# Patient Record
Sex: Male | Born: 1955 | Race: White | Hispanic: No | Smoking: Light tobacco smoker
Health system: Southern US, Community
[De-identification: ages and names within clinical notes are randomized; demographics above are authoritative.]

## PROBLEM LIST (undated history)

## (undated) DIAGNOSIS — I4892 Unspecified atrial flutter: Secondary | ICD-10-CM

## (undated) DIAGNOSIS — R590 Localized enlarged lymph nodes: Secondary | ICD-10-CM

## (undated) DIAGNOSIS — R739 Hyperglycemia, unspecified: Secondary | ICD-10-CM

## (undated) DIAGNOSIS — B269 Mumps without complication: Secondary | ICD-10-CM

## (undated) DIAGNOSIS — B059 Measles without complication: Secondary | ICD-10-CM

## (undated) DIAGNOSIS — J302 Other seasonal allergic rhinitis: Secondary | ICD-10-CM

## (undated) DIAGNOSIS — I10 Essential (primary) hypertension: Secondary | ICD-10-CM

## (undated) DIAGNOSIS — H40059 Ocular hypertension, unspecified eye: Secondary | ICD-10-CM

## (undated) DIAGNOSIS — Z9109 Other allergy status, other than to drugs and biological substances: Secondary | ICD-10-CM

## (undated) DIAGNOSIS — Z8619 Personal history of other infectious and parasitic diseases: Secondary | ICD-10-CM

## (undated) HISTORY — PX: OTHER SURGICAL HISTORY: SHX169

## (undated) HISTORY — DX: Other allergy status, other than to drugs and biological substances: Z91.09

## (undated) HISTORY — PX: COLONOSCOPY: SHX174

## (undated) HISTORY — DX: Measles without complication: B05.9

## (undated) HISTORY — DX: Hyperglycemia, unspecified: R73.9

## (undated) HISTORY — PX: CHOLECYSTECTOMY: SHX55

## (undated) HISTORY — PX: WISDOM TOOTH EXTRACTION: SHX21

## (undated) HISTORY — DX: Mumps without complication: B26.9

## (undated) HISTORY — DX: Localized enlarged lymph nodes: R59.0

## (undated) HISTORY — DX: Personal history of other infectious and parasitic diseases: Z86.19

## (undated) HISTORY — DX: Other seasonal allergic rhinitis: J30.2

## (undated) HISTORY — PX: TONSILLECTOMY: SUR1361

## (undated) HISTORY — DX: Unspecified atrial flutter: I48.92

## (undated) HISTORY — DX: Ocular hypertension, unspecified eye: H40.059

---

## 2001-04-08 ENCOUNTER — Encounter: Admission: RE | Admit: 2001-04-08 | Discharge: 2001-07-07 | Payer: Self-pay | Admitting: Family Medicine

## 2001-04-17 ENCOUNTER — Ambulatory Visit (HOSPITAL_BASED_OUTPATIENT_CLINIC_OR_DEPARTMENT_OTHER): Admission: RE | Admit: 2001-04-17 | Discharge: 2001-04-17 | Payer: Self-pay | Admitting: Otolaryngology

## 2001-12-17 ENCOUNTER — Encounter: Payer: Self-pay | Admitting: Neurosurgery

## 2001-12-17 ENCOUNTER — Observation Stay (HOSPITAL_COMMUNITY): Admission: RE | Admit: 2001-12-17 | Discharge: 2001-12-18 | Payer: Self-pay | Admitting: Neurosurgery

## 2008-11-22 LAB — HM COLONOSCOPY

## 2011-04-30 LAB — HM COLONOSCOPY

## 2014-05-18 ENCOUNTER — Ambulatory Visit (INDEPENDENT_AMBULATORY_CARE_PROVIDER_SITE_OTHER): Payer: Managed Care, Other (non HMO) | Admitting: Physician Assistant

## 2014-05-18 ENCOUNTER — Encounter: Payer: Self-pay | Admitting: Physician Assistant

## 2014-05-18 VITALS — BP 153/57 | HR 84 | Temp 98.0°F | Resp 16 | Ht 68.0 in | Wt 240.5 lb

## 2014-05-18 DIAGNOSIS — Z0000 Encounter for general adult medical examination without abnormal findings: Secondary | ICD-10-CM

## 2014-05-18 DIAGNOSIS — Z720 Tobacco use: Secondary | ICD-10-CM

## 2014-05-18 DIAGNOSIS — R030 Elevated blood-pressure reading, without diagnosis of hypertension: Secondary | ICD-10-CM

## 2014-05-18 DIAGNOSIS — IMO0001 Reserved for inherently not codable concepts without codable children: Secondary | ICD-10-CM

## 2014-05-18 NOTE — Patient Instructions (Signed)
Please read information below on smoking cessation. Please let me know if you do decide to proceed with medication to aid in this.  Please schedule a lab appointment for your physical labs.  You need to fast for 8 hours prior to this.  I will call you with all of your results.  Smoking Cessation, Tips for Success If you are ready to quit smoking, congratulations! You have chosen to help yourself be healthier. Cigarettes bring nicotine, tar, carbon monoxide, and other irritants into your body. Your lungs, heart, and blood vessels will be able to work better without these poisons. There are many different ways to quit smoking. Nicotine gum, nicotine patches, a nicotine inhaler, or nicotine nasal spray can help with physical craving. Hypnosis, support groups, and medicines help break the habit of smoking. WHAT THINGS CAN I DO TO MAKE QUITTING EASIER?  Here are some tips to help you quit for good:  Pick a date when you will quit smoking completely. Tell all of your friends and family about your plan to quit on that date.  Do not try to slowly cut down on the number of cigarettes you are smoking. Pick a quit date and quit smoking completely starting on that day.  Throw away all cigarettes.   Clean and remove all ashtrays from your home, work, and car.  On a card, write down your reasons for quitting. Carry the card with you and read it when you get the urge to smoke.  Cleanse your body of nicotine. Drink enough water and fluids to keep your urine clear or pale yellow. Do this after quitting to flush the nicotine from your body.  Learn to predict your moods. Do not let a bad situation be your excuse to have a cigarette. Some situations in your life might tempt you into wanting a cigarette.  Never have "just one" cigarette. It leads to wanting another and another. Remind yourself of your decision to quit.  Change habits associated with smoking. If you smoked while driving or when feeling stressed,  try other activities to replace smoking. Stand up when drinking your coffee. Brush your teeth after eating. Sit in a different chair when you read the paper. Avoid alcohol while trying to quit, and try to drink fewer caffeinated beverages. Alcohol and caffeine may urge you to smoke.  Avoid foods and drinks that can trigger a desire to smoke, such as sugary or spicy foods and alcohol.  Ask people who smoke not to smoke around you.  Have something planned to do right after eating or having a cup of coffee. For example, plan to take a walk or exercise.  Try a relaxation exercise to calm you down and decrease your stress. Remember, you may be tense and nervous for the first 2 weeks after you quit, but this will pass.  Find new activities to keep your hands busy. Play with a pen, coin, or rubber band. Doodle or draw things on paper.  Brush your teeth right after eating. This will help cut down on the craving for the taste of tobacco after meals. You can also try mouthwash.   Use oral substitutes in place of cigarettes. Try using lemon drops, carrots, cinnamon sticks, or chewing gum. Keep them handy so they are available when you have the urge to smoke.  When you have the urge to smoke, try deep breathing.  Designate your home as a nonsmoking area.  If you are a heavy smoker, ask your health care provider about  a prescription for nicotine chewing gum. It can ease your withdrawal from nicotine.  Reward yourself. Set aside the cigarette money you save and buy yourself something nice.  Look for support from others. Join a support group or smoking cessation program. Ask someone at home or at work to help you with your plan to quit smoking.  Always ask yourself, "Do I need this cigarette or is this just a reflex?" Tell yourself, "Today, I choose not to smoke," or "I do not want to smoke." You are reminding yourself of your decision to quit.  Do not replace cigarette smoking with electronic  cigarettes (commonly called e-cigarettes). The safety of e-cigarettes is unknown, and some may contain harmful chemicals.  If you relapse, do not give up! Plan ahead and think about what you will do the next time you get the urge to smoke. HOW WILL I FEEL WHEN I QUIT SMOKING? You may have symptoms of withdrawal because your body is used to nicotine (the addictive substance in cigarettes). You may crave cigarettes, be irritable, feel very hungry, cough often, get headaches, or have difficulty concentrating. The withdrawal symptoms are only temporary. They are strongest when you first quit but will go away within 10-14 days. When withdrawal symptoms occur, stay in control. Think about your reasons for quitting. Remind yourself that these are signs that your body is healing and getting used to being without cigarettes. Remember that withdrawal symptoms are easier to treat than the major diseases that smoking can cause.  Even after the withdrawal is over, expect periodic urges to smoke. However, these cravings are generally short lived and will go away whether you smoke or not. Do not smoke! WHAT RESOURCES ARE AVAILABLE TO HELP ME QUIT SMOKING? Your health care provider can direct you to community resources or hospitals for support, which may include:  Group support.  Education.  Hypnosis.  Therapy. Document Released: 01/12/2004 Document Revised: 08/30/2013 Document Reviewed: 10/01/2012 [facility] Patient Information 2015 [place], [place]. This information is not intended to replace advice given to you by your health care provider. Make sure you discuss any questions you have with your health care provider.  Preventive Care for Adults A healthy lifestyle and preventive care can promote health and wellness. Preventive health guidelines for men include the following key practices:  A routine yearly physical is a good way to check with your health care provider about your health and preventative  screening. It is a chance to share any concerns and updates on your health and to receive a thorough exam.  Visit your dentist for a routine exam and preventative care every 6 months. Brush your teeth twice a day and floss once a day. Good oral hygiene prevents tooth decay and gum disease.  The frequency of eye exams is based on your age, health, family medical history, use of contact lenses, and other factors. Follow your health care provider's recommendations for frequency of eye exams.  Eat a healthy diet. Foods such as vegetables, fruits, whole grains, low-fat dairy products, and lean protein foods contain the nutrients you need without too many calories. Decrease your intake of foods high in solid fats, added sugars, and salt. Eat the right amount of calories for you.Get information about a proper diet from your health care provider, if necessary.  Regular physical exercise is one of the most important things you can do for your health. Most adults should get at least 150 minutes of moderate-intensity exercise (any activity that increases your heart rate and  causes you to sweat) each week. In addition, most adults need muscle-strengthening exercises on 2 or more days a week.  Maintain a healthy weight. The body mass index (BMI) is a screening tool to identify possible weight problems. It provides an estimate of body fat based on height and weight. Your health care provider can find your BMI and can help you achieve or maintain a healthy weight.For adults 20 years and older:  A BMI below 18.5 is considered underweight.  A BMI of 18.5 to 24.9 is normal.  A BMI of 25 to 29.9 is considered overweight.  A BMI of 30 and above is considered obese.  Maintain normal blood lipids and cholesterol levels by exercising and minimizing your intake of saturated fat. Eat a balanced diet with plenty of fruit and vegetables. Blood tests for lipids and cholesterol should begin at age 67 and be repeated every  5 years. If your lipid or cholesterol levels are high, you are over 50, or you are at high risk for heart disease, you may need your cholesterol levels checked more frequently.Ongoing high lipid and cholesterol levels should be treated with medicines if diet and exercise are not working.  If you smoke, find out from your health care provider how to quit. If you do not use tobacco, do not start.  Lung cancer screening is recommended for adults aged 35-80 years who are at high risk for developing lung cancer because of a history of smoking. A yearly low-dose CT scan of the lungs is recommended for people who have at least a 30-pack-year history of smoking and are a current smoker or have quit within the past 15 years. A pack year of smoking is smoking an average of 1 pack of cigarettes a day for 1 year (for example: 1 pack a day for 30 years or 2 packs a day for 15 years). Yearly screening should continue until the smoker has stopped smoking for at least 15 years. Yearly screening should be stopped for people who develop a health problem that would prevent them from having lung cancer treatment.  If you choose to drink alcohol, do not have more than 2 drinks per day. One drink is considered to be 12 ounces (355 mL) of beer, 5 ounces (148 mL) of wine, or 1.5 ounces (44 mL) of liquor.  Avoid use of street drugs. Do not share needles with anyone. Ask for help if you need support or instructions about stopping the use of drugs.  High blood pressure causes heart disease and increases the risk of stroke. Your blood pressure should be checked at least every 1-2 years. Ongoing high blood pressure should be treated with medicines, if weight loss and exercise are not effective.  If you are 42-26 years old, ask your health care provider if you should take aspirin to prevent heart disease.  Diabetes screening involves taking a blood sample to check your fasting blood sugar level. This should be done once every 3  years, after age 37, if you are within normal weight and without risk factors for diabetes. Testing should be considered at a younger age or be carried out more frequently if you are overweight and have at least 1 risk factor for diabetes.  Colorectal cancer can be detected and often prevented. Most routine colorectal cancer screening begins at the age of 25 and continues through age 37. However, your health care provider may recommend screening at an earlier age if you have risk factors for colon cancer. On  a yearly basis, your health care provider may provide home test kits to check for hidden blood in the stool. Use of a small camera at the end of a tube to directly examine the colon (sigmoidoscopy or colonoscopy) can detect the earliest forms of colorectal cancer. Talk to your health care provider about this at age 67, when routine screening begins. Direct exam of the colon should be repeated every 5-10 years through age 10, unless early forms of precancerous polyps or small growths are found.  People who are at an increased risk for hepatitis B should be screened for this virus. You are considered at high risk for hepatitis B if:  You were born in a country where hepatitis B occurs often. Talk with your health care provider about which countries are considered high risk.  Your parents were born in a high-risk country and you have not received a shot to protect against hepatitis B (hepatitis B vaccine).  You have HIV or AIDS.  You use needles to inject street drugs.  You live with, or have sex with, someone who has hepatitis B.  You are a man who has sex with other men (MSM).  You get hemodialysis treatment.  You take certain medicines for conditions such as cancer, organ transplantation, and autoimmune conditions.  Hepatitis C blood testing is recommended for all people born from [birth year] through 1965 and any individual with known risks for hepatitis C.  Practice safe sex. Use condoms and  avoid high-risk sexual practices to reduce the spread of sexually transmitted infections (STIs). STIs include gonorrhea, chlamydia, syphilis, trichomonas, herpes, HPV, and human immunodeficiency virus (HIV). Herpes, HIV, and HPV are viral illnesses that have no cure. They can result in disability, cancer, and death.  If you are at risk of being infected with HIV, it is recommended that you take a prescription medicine daily to prevent HIV infection. This is called preexposure prophylaxis (PrEP). You are considered at risk if:  You are a man who has sex with other men (MSM) and have other risk factors.  You are a heterosexual man, are sexually active, and are at increased risk for HIV infection.  You take drugs by injection.  You are sexually active with a partner who has HIV.  Talk with your health care provider about whether you are at high risk of being infected with HIV. If you choose to begin PrEP, you should first be tested for HIV. You should then be tested every 3 months for as long as you are taking PrEP.  A one-time screening for abdominal aortic aneurysm (AAA) and surgical repair of large AAAs by ultrasound are recommended for men ages 92 to 24 years who are current or former smokers.  Healthy men should no longer receive prostate-specific antigen (PSA) blood tests as part of routine cancer screening. Talk with your health care provider about prostate cancer screening.  Testicular cancer screening is not recommended for adult males who have no symptoms. Screening includes self-exam, a health care provider exam, and other screening tests. Consult with your health care provider about any symptoms you have or any concerns you have about testicular cancer.  Use sunscreen. Apply sunscreen liberally and repeatedly throughout the day. You should seek shade when your shadow is shorter than you. Protect yourself by wearing long sleeves, pants, a wide-brimmed hat, and sunglasses year round,  whenever you are outdoors.  Once a month, do a whole-body skin exam, using a mirror to look at the skin on your  back. Tell your health care provider about new moles, moles that have irregular borders, moles that are larger than a pencil eraser, or moles that have changed in shape or color.  Stay current with required vaccines (immunizations).  Influenza vaccine. All adults should be immunized every year.  Tetanus, diphtheria, and acellular pertussis (Td, Tdap) vaccine. An adult who has not previously received Tdap or who does not know his vaccine status should receive 1 dose of Tdap. This initial dose should be followed by tetanus and diphtheria toxoids (Td) booster doses every 10 years. Adults with an unknown or incomplete history of completing a 3-dose immunization series with Td-containing vaccines should begin or complete a primary immunization series including a Tdap dose. Adults should receive a Td booster every 10 years.  Varicella vaccine. An adult without evidence of immunity to varicella should receive 2 doses or a second dose if he has previously received 1 dose.  Human papillomavirus (HPV) vaccine. Males aged 44-21 years who have not received the vaccine previously should receive the 3-dose series. Males aged 22-26 years may be immunized. Immunization is recommended through the age of 43 years for any male who has sex with males and did not get any or all doses earlier. Immunization is recommended for any person with an immunocompromised condition through the age of 36 years if he did not get any or all doses earlier. During the 3-dose series, the second dose should be obtained 4-8 weeks after the first dose. The third dose should be obtained 24 weeks after the first dose and 16 weeks after the second dose.  Zoster vaccine. One dose is recommended for adults aged 17 years or older unless certain conditions are present.  Measles, mumps, and rubella (MMR) vaccine. Adults born before [birth year]  generally are considered immune to measles and mumps. Adults born in [birth year] or later should have 1 or more doses of MMR vaccine unless there is a contraindication to the vaccine or there is laboratory evidence of immunity to each of the three diseases. A routine second dose of MMR vaccine should be obtained at least 28 days after the first dose for students attending postsecondary schools, health care workers, or international travelers. People who received inactivated measles vaccine or an unknown type of measles vaccine during 1963-1967 should receive 2 doses of MMR vaccine. People who received inactivated mumps vaccine or an unknown type of mumps vaccine before 1979 and are at high risk for mumps infection should consider immunization with 2 doses of MMR vaccine. Unvaccinated health care workers born before [birth year] who lack laboratory evidence of measles, mumps, or rubella immunity or laboratory confirmation of disease should consider measles and mumps immunization with 2 doses of MMR vaccine or rubella immunization with 1 dose of MMR vaccine.  Pneumococcal 13-valent conjugate (PCV13) vaccine. When indicated, a person who is uncertain of his immunization history and has no record of immunization should receive the PCV13 vaccine. An adult aged 65 years or older who has certain medical conditions and has not been previously immunized should receive 1 dose of PCV13 vaccine. This PCV13 should be followed with a dose of pneumococcal polysaccharide (PPSV23) vaccine. The PPSV23 vaccine dose should be obtained at least 8 weeks after the dose of PCV13 vaccine. An adult aged 54 years or older who has certain medical conditions and previously received 1 or more doses of PPSV23 vaccine should receive 1 dose of PCV13. The PCV13 vaccine dose should be obtained 1 or more years after the last  PPSV23 vaccine dose.  Pneumococcal polysaccharide (PPSV23) vaccine. When PCV13 is also indicated, PCV13 should be obtained first. All  adults aged 56 years and older should be immunized. An adult younger than age 76 years who has certain medical conditions should be immunized. Any person who resides in a nursing home or long-term care facility should be immunized. An adult smoker should be immunized. People with an immunocompromised condition and certain other conditions should receive both PCV13 and PPSV23 vaccines. People with human immunodeficiency virus (HIV) infection should be immunized as soon as possible after diagnosis. Immunization during chemotherapy or radiation therapy should be avoided. Routine use of PPSV23 vaccine is not recommended for American Indians, [place] Natives, or people younger than 65 years unless there are medical conditions that require PPSV23 vaccine. When indicated, people who have unknown immunization and have no record of immunization should receive PPSV23 vaccine. One-time revaccination 5 years after the first dose of PPSV23 is recommended for people aged 19-64 years who have chronic kidney failure, nephrotic syndrome, asplenia, or immunocompromised conditions. People who received 1-2 doses of PPSV23 before age 67 years should receive another dose of PPSV23 vaccine at age 54 years or later if at least 5 years have passed since the previous dose. Doses of PPSV23 are not needed for people immunized with PPSV23 at or after age 13 years.  Meningococcal vaccine. Adults with asplenia or persistent complement component deficiencies should receive 2 doses of quadrivalent meningococcal conjugate (MenACWY-D) vaccine. The doses should be obtained at least 2 months apart. Microbiologists working with certain meningococcal bacteria, [employer] recruits, people at risk during an outbreak, and people who travel to or live in countries with a high rate of meningitis should be immunized. A first-year college student up through age 70 years who is living in a residence hall should receive a dose if he did not receive a dose on or  after his 16th birthday. Adults who have certain high-risk conditions should receive one or more doses of vaccine.  Hepatitis A vaccine. Adults who wish to be protected from this disease, have certain high-risk conditions, work with hepatitis A-infected animals, work in hepatitis A research labs, or travel to or work in countries with a high rate of hepatitis A should be immunized. Adults who were previously unvaccinated and who anticipate close contact with an international adoptee during the first 60 days after arrival in the [place] from a country with a high rate of hepatitis A should be immunized.  Hepatitis B vaccine. Adults should be immunized if they wish to be protected from this disease, have certain high-risk conditions, may be exposed to blood or other infectious body fluids, are household contacts or sex partners of hepatitis B positive people, are clients or workers in certain care facilities, or travel to or work in countries with a high rate of hepatitis B.  Haemophilus influenzae type b (Hib) vaccine. A previously unvaccinated person with asplenia or sickle cell disease or having a scheduled splenectomy should receive 1 dose of Hib vaccine. Regardless of previous immunization, a recipient of a hematopoietic stem cell transplant should receive a 3-dose series 6-12 months after his successful transplant. Hib vaccine is not recommended for adults with HIV infection. Preventive Service / Frequency Ages 54 to 4  Blood pressure check.** / Every 1 to 2 years.  Lipid and cholesterol check.** / Every 5 years beginning at age 71.  Hepatitis C blood test.** / For any individual with known risks for hepatitis C.  Skin self-exam. /  Monthly.  Influenza vaccine. / Every year.  Tetanus, diphtheria, and acellular pertussis (Tdap, Td) vaccine.** / Consult your health care provider. 1 dose of Td every 10 years.  Varicella vaccine.** / Consult your health care provider.  HPV vaccine. / 3  doses over 6 months, if 57 or younger.  Measles, mumps, rubella (MMR) vaccine.** / You need at least 1 dose of MMR if you were born in 1957 or later. You may also need a second dose.  Pneumococcal 13-valent conjugate (PCV13) vaccine.** / Consult your health care provider.  Pneumococcal polysaccharide (PPSV23) vaccine.** / 1 to 2 doses if you smoke cigarettes or if you have certain conditions.  Meningococcal vaccine.** / 1 dose if you are age 19 to 59 years and a Market researcher living in a residence hall, or have one of several medical conditions. You may also need additional booster doses.  Hepatitis A vaccine.** / Consult your health care provider.  Hepatitis B vaccine.** / Consult your health care provider.  Haemophilus influenzae type b (Hib) vaccine.** / Consult your health care provider. Ages 39 to 19  Blood pressure check.** / Every 1 to 2 years.  Lipid and cholesterol check.** / Every 5 years beginning at age 67.  Lung cancer screening. / Every year if you are aged 18-80 years and have a 30-pack-year history of smoking and currently smoke or have quit within the past 15 years. Yearly screening is stopped once you have quit smoking for at least 15 years or develop a health problem that would prevent you from having lung cancer treatment.  Fecal occult blood test (FOBT) of stool. / Every year beginning at age 56 and continuing until age 48. You may not have to do this test if you get a colonoscopy every 10 years.  Flexible sigmoidoscopy** or colonoscopy.** / Every 5 years for a flexible sigmoidoscopy or every 10 years for a colonoscopy beginning at age 28 and continuing until age 60.  Hepatitis C blood test.** / For all people born from [birth year] through 1965 and any individual with known risks for hepatitis C.  Skin self-exam. / Monthly.  Influenza vaccine. / Every year.  Tetanus, diphtheria, and acellular pertussis (Tdap/Td) vaccine.** / Consult your health care  provider. 1 dose of Td every 10 years.  Varicella vaccine.** / Consult your health care provider.  Zoster vaccine.** / 1 dose for adults aged 99 years or older.  Measles, mumps, rubella (MMR) vaccine.** / You need at least 1 dose of MMR if you were born in 1958 or later. You may also need a second dose.  Pneumococcal 13-valent conjugate (PCV13) vaccine.** / Consult your health care provider.  Pneumococcal polysaccharide (PPSV23) vaccine.** / 1 to 2 doses if you smoke cigarettes or if you have certain conditions.  Meningococcal vaccine.** / Consult your health care provider.  Hepatitis A vaccine.** / Consult your health care provider.  Hepatitis B vaccine.** / Consult your health care provider.  Haemophilus influenzae type b (Hib) vaccine.** / Consult your health care provider. Ages 74 and over  Blood pressure check.** / Every 1 to 2 years.  Lipid and cholesterol check.**/ Every 5 years beginning at age 41.  Lung cancer screening. / Every year if you are aged 24-80 years and have a 30-pack-year history of smoking and currently smoke or have quit within the past 15 years. Yearly screening is stopped once you have quit smoking for at least 15 years or develop a health problem that would prevent you from having  lung cancer treatment.  Fecal occult blood test (FOBT) of stool. / Every year beginning at age 91 and continuing until age 81. You may not have to do this test if you get a colonoscopy every 10 years.  Flexible sigmoidoscopy** or colonoscopy.** / Every 5 years for a flexible sigmoidoscopy or every 10 years for a colonoscopy beginning at age 38 and continuing until age 80.  Hepatitis C blood test.** / For all people born from [birth year] through 1966 and any individual with known risks for hepatitis C.  Abdominal aortic aneurysm (AAA) screening.** / A one-time screening for ages 54 to 71 years who are current or former smokers.  Skin self-exam. / Monthly.  Influenza vaccine. / Every  year.  Tetanus, diphtheria, and acellular pertussis (Tdap/Td) vaccine.** / 1 dose of Td every 10 years.  Varicella vaccine.** / Consult your health care provider.  Zoster vaccine.** / 1 dose for adults aged 14 years or older.  Pneumococcal 13-valent conjugate (PCV13) vaccine.** / Consult your health care provider.  Pneumococcal polysaccharide (PPSV23) vaccine.** / 1 dose for all adults aged 61 years and older.  Meningococcal vaccine.** / Consult your health care provider.  Hepatitis A vaccine.** / Consult your health care provider.  Hepatitis B vaccine.** / Consult your health care provider.  Haemophilus influenzae type b (Hib) vaccine.** / Consult your health care provider. **Family history and personal history of risk and conditions may change your health care provider's recommendations. Document Released: 06/11/2001 Document Revised: 04/20/2013 Document Reviewed: 09/10/2010 [facility] Patient Information 2015 [place], [place]. This information is not intended to replace advice given to you by your health care provider. Make sure you discuss any questions you have with your health care provider.

## 2014-05-18 NOTE — Progress Notes (Signed)
Patient presents to clinic today to establish care. Patient requesting CPE.  Patient is a current smoker with 3 pack-year smoking history.  Wishes to stop smoking due to risks associated with tobacco abuse.  Has quit on his own previously, but resumed after the death of a loved one.  Health Maintenance: Dental -- up-to-date Vision -- up-to-date Immunizations -- Tetanus up-to-date.  Declines flu shot. Colonoscopy --  Last in 2012.    Past Medical History  Diagnosis Date  . Cervical adenopathy     Steel plate C5-C6  . Seasonal allergies   . Environmental allergies   . History of chicken pox   . Measles   . Mumps   . Hyperglycemia     Borderline Daibetes  . Increased pressure in the eye     Past Surgical History  Procedure Laterality Date  . Cholecystectomy    . Cervical haardware placement    . Colonoscopy      Q5 yrs  . Tonsillectomy    . Wisdom tooth extraction      No current outpatient prescriptions on file prior to visit.   No current facility-administered medications on file prior to visit.    No Known Allergies  Family History  Problem Relation Age of Onset  . Brain cancer Mother 85    Deceased  . Liver cancer Mother     Mets  . Anxiety disorder Mother   . Colon cancer Father 2    Deceased  . Alcoholism Father   . Cancer Other     Aunts & Uncles  . Leukemia Sister   . Leukemia Cousin   . Hypertension Brother   . Mental illness Brother     History   Social History  . Marital Status: Married    Spouse Name: N/A    Number of Children: N/A  . Years of Education: N/A   Occupational History  . Not on file.   Social History Main Topics  . Smoking status: Current Every Day Smoker -- 1.00 packs/day for 3 years    Types: Cigarettes  . Smokeless tobacco: Not on file  . Alcohol Use: 3.0 oz/week    5 Not specified per week     Comment: beer only  . Drug Use: No  . Sexual Activity:    Partners: Female   Other Topics Concern  . Not on  file   Social History Narrative  . No narrative on file   Review of Systems  Constitutional: Negative for fever and weight loss.  HENT: Negative for ear discharge, ear pain, hearing loss and tinnitus.   Eyes: Negative for blurred vision, double vision, photophobia and pain.  Respiratory: Negative for cough and shortness of breath.   Cardiovascular: Negative for chest pain, palpitations and orthopnea.  Gastrointestinal: Negative for heartburn, nausea, vomiting, abdominal pain, diarrhea, constipation, blood in stool and melena.  Genitourinary: Negative for dysuria, urgency, frequency, hematuria and flank pain.  Neurological: Negative for dizziness, loss of consciousness and headaches.  Endo/Heme/Allergies: Negative for environmental allergies.  Psychiatric/Behavioral: Negative for depression, suicidal ideas, hallucinations and substance abuse. The patient is not nervous/anxious and does not have insomnia.    BP 153/57 mmHg  Pulse 84  Temp(Src) 98 F (36.7 C) (Oral)  Resp 16  Ht 5\' 8"  (1.727 m)  Wt 240 lb 8 oz (109.09 kg)  BMI 36.58 kg/m2  SpO2 96%  Physical Exam  Constitutional: He is oriented to person, place, and time and well-developed, well-nourished, and in  no distress.  HENT:  Head: Normocephalic and atraumatic.  Right Ear: External ear normal.  Left Ear: External ear normal.  Nose: Nose normal.  Mouth/Throat: Oropharynx is clear and moist. No oropharyngeal exudate.  TM within normal limits bilaterally.  Eyes: Conjunctivae are normal. Pupils are equal, round, and reactive to light.  Neck: Neck supple. No thyromegaly present.  Cardiovascular: Normal rate, regular rhythm, normal heart sounds and intact distal pulses.   Pulmonary/Chest: Effort normal and breath sounds normal. No respiratory distress. He has no wheezes. He has no rales. He exhibits no tenderness.  Abdominal: Soft. Bowel sounds are normal. He exhibits no distension and no mass. There is no tenderness. There is  no rebound and no guarding.  Lymphadenopathy:    He has no cervical adenopathy.  Neurological: He is alert and oriented to person, place, and time.  Skin: Skin is warm and dry. No rash noted.  Psychiatric: Affect normal.  Vitals reviewed.  Assessment/Plan: Tobacco abuse disorder Tips and tricks for successful smoking cessation discussed with patient. After an in-depth discussion, patient wishes to try to wean off of cigarettes on his own. Patient has successfully done this before. Handout given to patient. Patient to call or return to clinic if unsuccessful.   Visit for preventive health examination I have reviewed the patient's medical history in detail and updated the computerized patient record. Patient up-to-date on required immunizations.  Colonoscopy up-to-date.  Due for repeat in 2023.  Will obtain fasting labs today.  Will treat if indicated by results.   Elevated BP Isolated measurement. Patient denies history of hypertension. Is overweight. DASH diet discussed. Handout given. Patient to increase aerobic exercise. We'll routinely monitor BP. If persistently elevated, will initiate antihypertensive regimen.

## 2014-05-18 NOTE — Progress Notes (Signed)
Pre visit review using our clinic review tool, if applicable. No additional management support is needed unless otherwise documented below in the visit note/SLS  

## 2014-05-19 ENCOUNTER — Other Ambulatory Visit: Payer: Managed Care, Other (non HMO)

## 2014-05-19 ENCOUNTER — Telehealth: Payer: Self-pay | Admitting: Physician Assistant

## 2014-05-19 NOTE — Telephone Encounter (Signed)
emmi emailed °

## 2014-05-22 DIAGNOSIS — R030 Elevated blood-pressure reading, without diagnosis of hypertension: Secondary | ICD-10-CM

## 2014-05-22 DIAGNOSIS — Z720 Tobacco use: Secondary | ICD-10-CM | POA: Insufficient documentation

## 2014-05-22 DIAGNOSIS — IMO0001 Reserved for inherently not codable concepts without codable children: Secondary | ICD-10-CM | POA: Insufficient documentation

## 2014-05-22 DIAGNOSIS — Z0000 Encounter for general adult medical examination without abnormal findings: Secondary | ICD-10-CM | POA: Insufficient documentation

## 2014-05-22 HISTORY — DX: Tobacco use: Z72.0

## 2014-05-22 NOTE — Assessment & Plan Note (Signed)
I have reviewed the patient's medical history in detail and updated the computerized patient record. Patient up-to-date on required immunizations.  Colonoscopy up-to-date.  Due for repeat in 2023.  Will obtain fasting labs today.  Will treat if indicated by results.

## 2014-05-22 NOTE — Assessment & Plan Note (Signed)
Isolated measurement. Patient denies history of hypertension. Is overweight. DASH diet discussed. Handout given. Patient to increase aerobic exercise. We'll routinely monitor BP. If persistently elevated, will initiate antihypertensive regimen.

## 2014-05-22 NOTE — Assessment & Plan Note (Addendum)
Tips and tricks for successful smoking cessation discussed with patient. After an in-depth discussion, patient wishes to try to wean off of cigarettes on his own. Patient has successfully done this before. Handout given to patient. Patient to call or return to clinic if unsuccessful.

## 2014-05-23 ENCOUNTER — Other Ambulatory Visit: Payer: Managed Care, Other (non HMO)

## 2014-05-23 LAB — COMPREHENSIVE METABOLIC PANEL
ALK PHOS: 75 U/L (ref 39–117)
ALT: 28 U/L (ref 0–53)
AST: 20 U/L (ref 0–37)
Albumin: 4.6 g/dL (ref 3.5–5.2)
BUN: 12 mg/dL (ref 6–23)
CO2: 24 mEq/L (ref 19–32)
Calcium: 9.5 mg/dL (ref 8.4–10.5)
Chloride: 104 mEq/L (ref 96–112)
Creatinine, Ser: 0.87 mg/dL (ref 0.40–1.50)
GFR: 95.2 mL/min (ref >60.00)
Glucose, Bld: 142 mg/dL — ABNORMAL HIGH (ref 70–99)
Potassium: 4.4 mEq/L (ref 3.5–5.1)
Sodium: 140 mEq/L (ref 135–145)
Total Bilirubin: 0.5 mg/dL (ref 0.2–1.2)
Total Protein: 7.1 g/dL (ref 6.0–8.3)

## 2014-05-23 LAB — LIPID PANEL
CHOLESTEROL: 190 mg/dL (ref 0–200)
HDL: 47.2 mg/dL (ref >39.00)
LDL Cholesterol: 119 mg/dL — ABNORMAL HIGH (ref 0–99)
NonHDL: 142.8
Total CHOL/HDL Ratio: 4
Triglycerides: 120 mg/dL (ref 0.0–149.0)
VLDL: 24 mg/dL (ref 0.0–40.0)

## 2014-05-23 LAB — URINALYSIS, ROUTINE W REFLEX MICROSCOPIC
Bilirubin Urine: NEGATIVE
Hgb urine dipstick: NEGATIVE
KETONES UR: NEGATIVE
Leukocytes, UA: NEGATIVE
NITRITE: NEGATIVE
RBC / HPF: NONE SEEN (ref 0–?)
SPECIFIC GRAVITY, URINE: 1.01 (ref 1.000–1.030)
URINE GLUCOSE: NEGATIVE
Urobilinogen, UA: 0.2 (ref 0.0–1.0)
pH: 6.5 (ref 5.0–8.0)

## 2014-05-23 LAB — CBC
HCT: 48.7 % (ref 39.0–52.0)
Hemoglobin: 16.8 g/dL (ref 13.0–17.0)
MCHC: 34.4 g/dL (ref 30.0–36.0)
MCV: 91 fl (ref 78.0–100.0)
Platelets: 160 10*3/uL (ref 150.0–400.0)
RBC: 5.35 Mil/uL (ref 4.22–5.81)
RDW: 13.4 % (ref 11.5–15.5)
WBC: 6.4 10*3/uL (ref 4.0–10.5)

## 2014-05-23 LAB — HEMOGLOBIN A1C: Hgb A1c MFr Bld: 6.4 % (ref 4.6–6.5)

## 2014-05-23 LAB — PSA: PSA: 0.75 ng/mL (ref 0.10–4.00)

## 2014-05-23 LAB — TSH: TSH: 1.79 u[IU]/mL (ref 0.35–4.50)

## 2014-05-30 ENCOUNTER — Encounter: Payer: Self-pay | Admitting: *Deleted

## 2015-05-31 ENCOUNTER — Other Ambulatory Visit: Payer: Self-pay | Admitting: Physician Assistant

## 2015-05-31 ENCOUNTER — Ambulatory Visit (HOSPITAL_BASED_OUTPATIENT_CLINIC_OR_DEPARTMENT_OTHER)
Admission: RE | Admit: 2015-05-31 | Discharge: 2015-05-31 | Disposition: A | Discharge status: Home / Self Care | Payer: Managed Care, Other (non HMO) | Source: Ambulatory Visit | Attending: Physician Assistant | Admitting: Physician Assistant

## 2015-05-31 ENCOUNTER — Ambulatory Visit (INDEPENDENT_AMBULATORY_CARE_PROVIDER_SITE_OTHER): Payer: Managed Care, Other (non HMO) | Admitting: Physician Assistant

## 2015-05-31 ENCOUNTER — Encounter: Payer: Self-pay | Admitting: Physician Assistant

## 2015-05-31 VITALS — BP 171/80 | HR 99 | Temp 98.1°F | Ht 68.0 in | Wt 245.8 lb

## 2015-05-31 DIAGNOSIS — IMO0001 Reserved for inherently not codable concepts without codable children: Secondary | ICD-10-CM

## 2015-05-31 DIAGNOSIS — J209 Acute bronchitis, unspecified: Secondary | ICD-10-CM | POA: Diagnosis not present

## 2015-05-31 DIAGNOSIS — D1720 Benign lipomatous neoplasm of skin and subcutaneous tissue of unspecified limb: Secondary | ICD-10-CM

## 2015-05-31 DIAGNOSIS — J45901 Unspecified asthma with (acute) exacerbation: Secondary | ICD-10-CM | POA: Diagnosis not present

## 2015-05-31 DIAGNOSIS — Z1211 Encounter for screening for malignant neoplasm of colon: Secondary | ICD-10-CM | POA: Diagnosis not present

## 2015-05-31 DIAGNOSIS — I517 Cardiomegaly: Secondary | ICD-10-CM

## 2015-05-31 DIAGNOSIS — R918 Other nonspecific abnormal finding of lung field: Secondary | ICD-10-CM | POA: Insufficient documentation

## 2015-05-31 DIAGNOSIS — R030 Elevated blood-pressure reading, without diagnosis of hypertension: Secondary | ICD-10-CM

## 2015-05-31 DIAGNOSIS — R05 Cough: Secondary | ICD-10-CM | POA: Diagnosis present

## 2015-05-31 HISTORY — DX: Benign lipomatous neoplasm of skin and subcutaneous tissue of unspecified limb: D17.20

## 2015-05-31 MED ORDER — LEVOFLOXACIN 500 MG PO TABS: 500.0000 mg | ORAL_TABLET | Freq: Every day | ORAL | Status: DC

## 2015-05-31 MED ORDER — IPRATROPIUM-ALBUTEROL 0.5-2.5 (3) MG/3ML IN SOLN
3.0000 mL | Freq: Once | RESPIRATORY_TRACT | Status: AC
  Administered 2015-05-31: 3 mL via RESPIRATORY_TRACT

## 2015-05-31 MED ORDER — BENZONATATE 100 MG PO CAPS: 100.0000 mg | ORAL_CAPSULE | Freq: Two times a day (BID) | ORAL | Status: DC | PRN

## 2015-05-31 MED ORDER — ALBUTEROL SULFATE HFA 108 (90 BASE) MCG/ACT IN AERS: 2.0000 | INHALATION_SPRAY | Freq: Four times a day (QID) | RESPIRATORY_TRACT | Status: DC | PRN

## 2015-05-31 NOTE — Assessment & Plan Note (Signed)
+   Family history -- father and uncle. Needs screening every 5 years and is due. Referral placed.

## 2015-05-31 NOTE — Patient Instructions (Signed)
Take antibiotic (Levaquin) as directed.  Increase fluids.  Get plenty of rest. Use Mucinex for congestion. Tessalon for cough and use the Albuterol as directed. Take a daily probiotic (I recommend Align or Culturelle, but even Activia Yogurt may be beneficial).  A humidifier placed in the bedroom may offer some relief for a dry, scratchy throat of nasal irritation.  Read information below on acute bronchitis. Please call or return to clinic if symptoms are not improving.  Do not forget to go downstairs for x-ray. Follow-up 2 weeks for a BP check.  Acute Bronchitis Bronchitis is when the airways that extend from the windpipe into the lungs get red, puffy, and painful (inflamed). Bronchitis often causes thick spit (mucus) to develop. This leads to a cough. A cough is the most common symptom of bronchitis. In acute bronchitis, the condition usually begins suddenly and goes away over time (usually in 2 weeks). Smoking, allergies, and asthma can make bronchitis worse. Repeated episodes of bronchitis may cause more lung problems.  HOME CARE  Rest.  Drink enough fluids to keep your pee (urine) clear or pale yellow (unless you need to limit fluids as told by your doctor).  Only take over-the-counter or prescription medicines as told by your doctor.  Avoid smoking and secondhand smoke. These can make bronchitis worse. If you are a smoker, think about using nicotine gum or skin patches. Quitting smoking will help your lungs heal faster.  Reduce the chance of getting bronchitis again by:  Washing your hands often.  Avoiding people with cold symptoms.  Trying not to touch your hands to your mouth, nose, or eyes.  Follow up with your doctor as told.  GET HELP IF: Your symptoms do not improve after 1 week of treatment. Symptoms include:  Cough.  Fever.  Coughing up thick spit.  Body aches.  Chest congestion.  Chills.  Shortness of breath.  Sore throat.  GET HELP RIGHT AWAY IF:    You have an increased fever.  You have chills.  You have severe shortness of breath.  You have bloody thick spit (sputum).  You throw up (vomit) often.  You lose too much body fluid (dehydration).  You have a severe headache.  You faint.  MAKE SURE YOU:   Understand these instructions.  Will watch your condition.  Will get help right away if you are not doing well or get worse. Document Released: 10/02/2007 Document Revised: 12/16/2012 Document Reviewed: 10/06/2012 [facility] Patient Information 2015 [place], [place]. This information is not intended to replace advice given to you by your health care provider. Make sure you discuss any questions you have with your health care provider.

## 2015-05-31 NOTE — Assessment & Plan Note (Signed)
Duoneb given x 1 with improvement in wheeze. Will obtain CXR today. Rx Albuterol inhaler to use as directed. Rx Levaquin. Patient to stop Mucinex-D due to elevation of BP. Will use Mucinex instead. Rx Tessalon for cough. Supportive measures reviewed.

## 2015-05-31 NOTE — Assessment & Plan Note (Signed)
Reassurance given. Prognosis reviewed. No further intervention necessary unless patient decides to remove for cosmetic purposes.

## 2015-05-31 NOTE — Progress Notes (Signed)
Patient presents to clinic today c/o 3 weeks of chest congestion with productive cough, SOB and wheezing. Patient is a current smoker, smoking 1.5 ppd. Endorses history of asthmatic bronchitis. Denies fever, chills, sinus symptoms, recent travel or sick contact. Has been taking Mucinex-D twice daily. Notes high BP with medication. Patient denies chest pain, palpitations, lightheadedness, dizziness, vision changes or frequent headaches.  Patient also c/o knot on left elbow x 2 months that is non-painful or pruritic. Denies redness or bruising to the area.   Patient due for repeat colonoscopy -- last 5 years ago. He is on an every 5-year plan due to significant family history of colorectal cancer  Past Medical History  Diagnosis Date  . Cervical adenopathy     Steel plate C5-C6  . Seasonal allergies   . Environmental allergies   . History of chicken pox   . Measles   . Mumps   . Hyperglycemia     Borderline Daibetes  . Increased pressure in the eye     Current Outpatient Prescriptions on File Prior to Visit  Medication Sig Dispense Refill  . Ibuprofen 200 MG CAPS Take by mouth as needed.     No current facility-administered medications on file prior to visit.    No Known Allergies  Family History  Problem Relation Age of Onset  . Brain cancer Mother 21    Deceased  . Liver cancer Mother     Mets  . Anxiety disorder Mother   . Colon cancer Father 66    Deceased  . Alcoholism Father   . Cancer Other     Aunts & Uncles  . Leukemia Sister   . Leukemia Cousin   . Hypertension Brother   . Mental illness Brother     Social History   Social History  . Marital Status: Married    Spouse Name: N/A  . Number of Children: N/A  . Years of Education: N/A   Social History Main Topics  . Smoking status: Current Every Day Smoker -- 1.00 packs/day for 3 years    Types: Cigarettes  . Smokeless tobacco: None  . Alcohol Use: 3.0 oz/week    5 Standard drinks or equivalent per  week     Comment: beer only  . Drug Use: No  . Sexual Activity:    Partners: Female   Other Topics Concern  . None   Social History Narrative   Review of Systems - See HPI.  All other ROS are negative.  BP 171/80 mmHg  Pulse 99  Temp(Src) 98.1 F (36.7 C) (Oral)  Ht 5\' 8"  (1.727 m)  Wt 245 lb 12.8 oz (111.494 kg)  BMI 37.38 kg/m2  SpO2 93%  Physical Exam  Constitutional: He is oriented to person, place, and time and well-developed, well-nourished, and in no distress.  HENT:  Head: Normocephalic and atraumatic.  Right Ear: External ear normal.  Left Ear: External ear normal.  Nose: Nose normal.  Mouth/Throat: Oropharynx is clear and moist. No oropharyngeal exudate.  TM within normal limits bilaterally.  Eyes: Conjunctivae are normal.  Neck: Neck supple.  Cardiovascular: Normal rate, regular rhythm, normal heart sounds and intact distal pulses.   Pulmonary/Chest: Effort normal. No respiratory distress. He has wheezes. He has no rales. He exhibits no tenderness.  Neurological: He is alert and oriented to person, place, and time.  Skin: Skin is warm and dry. No rash noted.     Vitals reviewed.   No results found for this  or any previous visit (from the past 2160 hour(s)).  Assessment/Plan: Lipoma of arm Reassurance given. Prognosis reviewed. No further intervention necessary unless patient decides to remove for cosmetic purposes.  Colon cancer screening + Family history -- father and uncle. Needs screening every 5 years and is due. Referral placed.  Acute bronchitis with asthma with acute exacerbation Duoneb given x 1 with improvement in wheeze. Will obtain CXR today. Rx Albuterol inhaler to use as directed. Rx Levaquin. Patient to stop Mucinex-D due to elevation of BP. Will use Mucinex instead. Rx Tessalon for cough. Supportive measures reviewed.  Elevated BP Mucinex-D use. Patient to stop medication. Follow-up 2 weeks for BP check

## 2015-05-31 NOTE — Progress Notes (Signed)
Pre visit review using our clinic review tool, if applicable. No additional management support is needed unless otherwise documented below in the visit note. 

## 2015-05-31 NOTE — Assessment & Plan Note (Signed)
Mucinex-D use. Patient to stop medication. Follow-up 2 weeks for BP check

## 2015-06-01 ENCOUNTER — Ambulatory Visit (HOSPITAL_COMMUNITY): Payer: Managed Care, Other (non HMO) | Attending: Cardiovascular Disease

## 2015-06-01 ENCOUNTER — Other Ambulatory Visit: Payer: Self-pay

## 2015-06-01 DIAGNOSIS — F17200 Nicotine dependence, unspecified, uncomplicated: Secondary | ICD-10-CM | POA: Diagnosis not present

## 2015-06-01 DIAGNOSIS — I517 Cardiomegaly: Secondary | ICD-10-CM | POA: Diagnosis not present

## 2015-06-07 ENCOUNTER — Ambulatory Visit (INDEPENDENT_AMBULATORY_CARE_PROVIDER_SITE_OTHER): Payer: Managed Care, Other (non HMO) | Admitting: Physician Assistant

## 2015-06-07 ENCOUNTER — Encounter: Payer: Self-pay | Admitting: Physician Assistant

## 2015-06-07 VITALS — BP 171/79 | HR 100 | Temp 98.1°F | Ht 68.0 in | Wt 246.4 lb

## 2015-06-07 DIAGNOSIS — I517 Cardiomegaly: Secondary | ICD-10-CM | POA: Diagnosis not present

## 2015-06-07 DIAGNOSIS — I119 Hypertensive heart disease without heart failure: Secondary | ICD-10-CM | POA: Insufficient documentation

## 2015-06-07 DIAGNOSIS — I10 Essential (primary) hypertension: Secondary | ICD-10-CM

## 2015-06-07 DIAGNOSIS — Z720 Tobacco use: Secondary | ICD-10-CM

## 2015-06-07 HISTORY — DX: Cardiomegaly: I51.7

## 2015-06-07 HISTORY — DX: Hypertensive heart disease without heart failure: I11.9

## 2015-06-07 MED ORDER — LISINOPRIL 20 MG PO TABS: 20.0000 mg | ORAL_TABLET | Freq: Every day | ORAL | Status: DC

## 2015-06-07 MED ORDER — VARENICLINE TARTRATE 0.5 MG X 11 & 1 MG X 42 PO MISC: ORAL | Status: DC

## 2015-06-07 NOTE — Assessment & Plan Note (Signed)
Will begin lisinopril 10 mg x 3-4 days before increasing to 20 mg daily. DASH diet discussed. Smoking cessation importance reviewed. Patient agrees to therapy for cessation (see A/P). Potential side effects of ACEI reviewed. He is to stop medication and call the office if these occur. Will follow-up 3-4 weeks.

## 2015-06-07 NOTE — Progress Notes (Signed)
Pre visit review using our clinic review tool, if applicable. No additional management support is needed unless otherwise documented below in the visit note. 

## 2015-06-07 NOTE — Patient Instructions (Addendum)
Please start the lisinopril daily as directed -- Take 1/2 tablet daily for 3 days before increasing to a whole tablet. Increase exercise and follow the diet below. As discussed, if you notice any cough or swelling of the lips, stop the medication and call me.  Please start Chantix after tolerating BP medications for a week.  Follow-up with me in 3-4 weeks.   DASH Eating Plan DASH stands for "Dietary Approaches to Stop Hypertension." The DASH eating plan is a healthy eating plan that has been shown to reduce high blood pressure (hypertension). Additional health benefits may include reducing the risk of type 2 diabetes mellitus, heart disease, and stroke. The DASH eating plan may also help with weight loss. WHAT DO I NEED TO KNOW ABOUT THE DASH EATING PLAN? For the DASH eating plan, you will follow these general guidelines:  Choose foods with a percent daily value for sodium of less than 5% (as listed on the food label).  Use salt-free seasonings or herbs instead of table salt or sea salt.  Check with your health care provider or pharmacist before using salt substitutes.  Eat lower-sodium products, often labeled as "lower sodium" or "no salt added."  Eat fresh foods.  Eat more vegetables, fruits, and low-fat dairy products.  Choose whole grains. Look for the word "whole" as the first word in the ingredient list.  Choose fish and skinless chicken or [place] more often than red meat. Limit fish, poultry, and meat to 6 oz (170 g) each day.  Limit sweets, desserts, sugars, and sugary drinks.  Choose heart-healthy fats.  Limit cheese to 1 oz (28 g) per day.  Eat more home-cooked food and less restaurant, buffet, and fast food.  Limit fried foods.  Cook foods using methods other than frying.  Limit canned vegetables. If you do use them, rinse them well to decrease the sodium.  When eating at a restaurant, ask that your food be prepared with less salt, or no salt if possible. WHAT  FOODS CAN I EAT? Seek help from a dietitian for individual calorie needs. Grains Whole grain or whole wheat bread. Brown rice. Whole grain or whole wheat pasta. Quinoa, bulgur, and whole grain cereals. Low-sodium cereals. Corn or whole wheat flour tortillas. Whole grain cornbread. Whole grain crackers. Low-sodium crackers. Vegetables Fresh or frozen vegetables (raw, steamed, roasted, or grilled). Low-sodium or reduced-sodium tomato and vegetable juices. Low-sodium or reduced-sodium tomato sauce and paste. Low-sodium or reduced-sodium canned vegetables.  Fruits All fresh, canned (in natural juice), or frozen fruits. Meat and Other Protein Products Ground beef (85% or leaner), grass-fed beef, or beef trimmed of fat. Skinless chicken or [place]. Ground chicken or [place]. Pork trimmed of fat. All fish and seafood. Eggs. Dried beans, peas, or lentils. Unsalted nuts and seeds. Unsalted canned beans. Dairy Low-fat dairy products, such as skim or 1% milk, 2% or reduced-fat cheeses, low-fat ricotta or cottage cheese, or plain low-fat yogurt. Low-sodium or reduced-sodium cheeses. Fats and Oils Tub margarines without trans fats. Light or reduced-fat mayonnaise and salad dressings (reduced sodium). Avocado. Safflower, olive, or canola oils. Natural peanut or almond butter. Other Unsalted popcorn and pretzels. The items listed above may not be a complete list of recommended foods or beverages. Contact your dietitian for more options. WHAT FOODS ARE NOT RECOMMENDED? Grains White bread. White pasta. White rice. Refined cornbread. Bagels and croissants. Crackers that contain trans fat. Vegetables Creamed or fried vegetables. Vegetables in a cheese sauce. Regular canned vegetables. Regular canned tomato sauce and  paste. Regular tomato and vegetable juices. Fruits Dried fruits. Canned fruit in light or heavy syrup. Fruit juice. Meat and Other Protein Products Fatty cuts of meat. Ribs, chicken wings, bacon,  sausage, bologna, salami, chitterlings, fatback, hot dogs, bratwurst, and packaged luncheon meats. Salted nuts and seeds. Canned beans with salt. Dairy Whole or 2% milk, cream, half-and-half, and cream cheese. Whole-fat or sweetened yogurt. Full-fat cheeses or blue cheese. Nondairy creamers and whipped toppings. Processed cheese, cheese spreads, or cheese curds. Condiments Onion and garlic salt, seasoned salt, table salt, and sea salt. Canned and packaged gravies. Worcestershire sauce. Tartar sauce. Barbecue sauce. Teriyaki sauce. Soy sauce, including reduced sodium. Steak sauce. Fish sauce. Oyster sauce. Cocktail sauce. Horseradish. Ketchup and mustard. Meat flavorings and tenderizers. Bouillon cubes. Hot sauce. Tabasco sauce. Marinades. Taco seasonings. Relishes. Fats and Oils Butter, stick margarine, lard, shortening, ghee, and bacon fat. Coconut, palm kernel, or palm oils. Regular salad dressings. Other Pickles and olives. Salted popcorn and pretzels. The items listed above may not be a complete list of foods and beverages to avoid. Contact your dietitian for more information. WHERE CAN I FIND MORE INFORMATION? National Heart, Lung, and Blood Institute: travelstabloid.com   This information is not intended to replace advice given to you by your health care provider. Make sure you discuss any questions you have with your health care provider.   Document Released: 04/04/2011 Document Revised: 05/06/2014 Document Reviewed: 02/17/2013 Elsevier Interactive Patient Education [employer].

## 2015-06-07 NOTE — Assessment & Plan Note (Signed)
Echo reveals preserved EF and Grade I Diastolic Dysfunction. Suspect Cardiomegaly secondary to long-term undiagnosed/untreated HTN. Will be HTN regimen.

## 2015-06-07 NOTE — Assessment & Plan Note (Signed)
Patient endorses readiness to quit. Treatment options discussed with patient. 10 minutes spent on smoking cessation counseling. Will begin Chantix starting pack. Follow-up 1 month.

## 2015-06-07 NOTE — Progress Notes (Signed)
Patient presents to clinic today for follow-up of acute bronchitis with asthma exacerbation. Patient was first seen on 05/31/15 with complaints of 3 weeks of chest congestion with shortness of breath. Was diagnosed with acute bronchitis with reactive airway. Some concern for CAP so CXR was obtained that day. Negative for pneumonia but revealed cardiomegaly. . Patient started on LEvaquin and albuterol. Endorses taking medications as directed. Endorses feeling much better. Denies new or worsening symptoms.  Does note BP has been elevated at home. Has brought a log of home BP measurements ranging 160-180/90-100.  Patient denies chest pain, palpitations, lightheadedness, dizziness, vision changes or frequent headaches.  BP Readings from Last 3 Encounters:  06/07/15 171/79  05/31/15 171/80  05/18/14 153/57    Echo obtained due to Cardiomegaly on CXR. Preserved systolic function with EF 60-65%. Grade I Diastolic dysfunction noted.  Past Medical History  Diagnosis Date  . Cervical adenopathy     Steel plate C5-C6  . Seasonal allergies   . Environmental allergies   . History of chicken pox   . Measles   . Mumps   . Hyperglycemia     Borderline Daibetes  . Increased pressure in the eye     Current Outpatient Prescriptions on File Prior to Visit  Medication Sig Dispense Refill  . albuterol (PROVENTIL HFA;VENTOLIN HFA) 108 (90 Base) MCG/ACT inhaler Inhale 2 puffs into the lungs every 6 (six) hours as needed for wheezing or shortness of breath. 1 Inhaler 0  . Ibuprofen 200 MG CAPS Take by mouth as needed.     No current facility-administered medications on file prior to visit.    No Known Allergies  Family History  Problem Relation Age of Onset  . Brain cancer Mother 26    Deceased  . Liver cancer Mother     Mets  . Anxiety disorder Mother   . Colon cancer Father 3    Deceased  . Alcoholism Father   . Cancer Other     Aunts & Uncles  . Leukemia Sister   . Leukemia Cousin   .  Hypertension Brother   . Mental illness Brother     Social History   Social History  . Marital Status: Married    Spouse Name: N/A  . Number of Children: N/A  . Years of Education: N/A   Social History Main Topics  . Smoking status: Current Every Day Smoker -- 1.00 packs/day for 3 years    Types: Cigarettes  . Smokeless tobacco: None  . Alcohol Use: 3.0 oz/week    5 Standard drinks or equivalent per week     Comment: beer only  . Drug Use: No  . Sexual Activity:    Partners: Female   Other Topics Concern  . None   Social History Narrative   Review of Systems - See HPI.  All other ROS are negative.  BP 171/79 mmHg  Pulse 100  Temp(Src) 98.1 F (36.7 C) (Oral)  Ht 5\' 8"  (1.727 m)  Wt 246 lb 6.4 oz (111.766 kg)  BMI 37.47 kg/m2  SpO2 93%  Physical Exam  Constitutional: He is oriented to person, place, and time and well-developed, well-nourished, and in no distress.  HENT:  Head: Normocephalic and atraumatic.  Right Ear: External ear normal.  Left Ear: External ear normal.  Nose: Nose normal.  Mouth/Throat: Oropharynx is clear and moist. No oropharyngeal exudate.  TM within normal limits. Tobacco stains noted on tongue.  Eyes: Conjunctivae are normal.  Neck: Neck supple.  Cardiovascular: Normal rate, regular rhythm, normal heart sounds and intact distal pulses.   Pulmonary/Chest: Effort normal and breath sounds normal. No respiratory distress. He has no wheezes. He has no rales. He exhibits no tenderness.  Neurological: He is alert and oriented to person, place, and time.  Skin: Skin is warm and dry. No rash noted.  Psychiatric: Affect normal.  Vitals reviewed.   No results found for this or any previous visit (from the past 2160 hour(s)).  Assessment/Plan: Cardiomegaly Echo reveals preserved EF and Grade I Diastolic Dysfunction. Suspect Cardiomegaly secondary to long-term undiagnosed/untreated HTN. Will be HTN regimen.  Essential hypertension Will begin  lisinopril 10 mg x 3-4 days before increasing to 20 mg daily. DASH diet discussed. Smoking cessation importance reviewed. Patient agrees to therapy for cessation (see A/P). Potential side effects of ACEI reviewed. He is to stop medication and call the office if these occur. Will follow-up 3-4 weeks.   Tobacco abuse disorder Patient endorses readiness to quit. Treatment options discussed with patient. 10 minutes spent on smoking cessation counseling. Will begin Chantix starting pack. Follow-up 1 month.

## 2015-06-14 ENCOUNTER — Ambulatory Visit: Payer: Managed Care, Other (non HMO) | Admitting: Physician Assistant

## 2015-07-05 ENCOUNTER — Ambulatory Visit (INDEPENDENT_AMBULATORY_CARE_PROVIDER_SITE_OTHER): Payer: Managed Care, Other (non HMO) | Admitting: Physician Assistant

## 2015-07-05 VITALS — BP 130/60 | HR 84 | Temp 98.1°F | Resp 14 | Ht 68.0 in | Wt 247.4 lb

## 2015-07-05 DIAGNOSIS — I10 Essential (primary) hypertension: Secondary | ICD-10-CM

## 2015-07-05 LAB — LIPID PANEL
CHOLESTEROL: 191 mg/dL (ref 0–200)
HDL: 40.5 mg/dL (ref >39.00)
LDL CALC: 128 mg/dL — AB (ref 0–99)
NonHDL: 150.44
TRIGLYCERIDES: 113 mg/dL (ref 0.0–149.0)
Total CHOL/HDL Ratio: 5
VLDL: 22.6 mg/dL (ref 0.0–40.0)

## 2015-07-05 LAB — HEMOGLOBIN A1C: HEMOGLOBIN A1C: 6.7 % — AB (ref 4.6–6.5)

## 2015-07-05 LAB — COMPREHENSIVE METABOLIC PANEL
ALBUMIN: 4.6 g/dL (ref 3.5–5.2)
ALT: 37 U/L (ref 0–53)
AST: 21 U/L (ref 0–37)
Alkaline Phosphatase: 69 U/L (ref 39–117)
BUN: 13 mg/dL (ref 6–23)
CALCIUM: 9.4 mg/dL (ref 8.4–10.5)
CHLORIDE: 98 meq/L (ref 96–112)
CO2: 29 mEq/L (ref 19–32)
CREATININE: 0.73 mg/dL (ref 0.40–1.50)
GFR: 116.13 mL/min (ref >60.00)
Glucose, Bld: 186 mg/dL — ABNORMAL HIGH (ref 70–99)
POTASSIUM: 4.5 meq/L (ref 3.5–5.1)
Sodium: 133 mEq/L — ABNORMAL LOW (ref 135–145)
Total Bilirubin: 0.7 mg/dL (ref 0.2–1.2)
Total Protein: 7 g/dL (ref 6.0–8.3)

## 2015-07-05 NOTE — Patient Instructions (Addendum)
Please go to the lab for blood work. I will call you with your results.  Please continue medication as directed. Watch salt intake. Write down your evening BP measurements and send them to me on MyChart in 1 week.  Increase exercise. Start an 81 mg aspirin daily.   DASH Eating Plan DASH stands for "Dietary Approaches to Stop Hypertension." The DASH eating plan is a healthy eating plan that has been shown to reduce high blood pressure (hypertension). Additional health benefits may include reducing the risk of type 2 diabetes mellitus, heart disease, and stroke. The DASH eating plan may also help with weight loss. WHAT DO I NEED TO KNOW ABOUT THE DASH EATING PLAN? For the DASH eating plan, you will follow these general guidelines:  Choose foods with a percent daily value for sodium of less than 5% (as listed on the food label).  Use salt-free seasonings or herbs instead of table salt or sea salt.  Check with your health care provider or pharmacist before using salt substitutes.  Eat lower-sodium products, often labeled as "lower sodium" or "no salt added."  Eat fresh foods.  Eat more vegetables, fruits, and low-fat dairy products.  Choose whole grains. Look for the word "whole" as the first word in the ingredient list.  Choose fish and skinless chicken or [place] more often than red meat. Limit fish, poultry, and meat to 6 oz (170 g) each day.  Limit sweets, desserts, sugars, and sugary drinks.  Choose heart-healthy fats.  Limit cheese to 1 oz (28 g) per day.  Eat more home-cooked food and less restaurant, buffet, and fast food.  Limit fried foods.  Cook foods using methods other than frying.  Limit canned vegetables. If you do use them, rinse them well to decrease the sodium.  When eating at a restaurant, ask that your food be prepared with less salt, or no salt if possible. WHAT FOODS CAN I EAT? Seek help from a dietitian for individual calorie needs. Grains Whole  grain or whole wheat bread. Brown rice. Whole grain or whole wheat pasta. Quinoa, bulgur, and whole grain cereals. Low-sodium cereals. Corn or whole wheat flour tortillas. Whole grain cornbread. Whole grain crackers. Low-sodium crackers. Vegetables Fresh or frozen vegetables (raw, steamed, roasted, or grilled). Low-sodium or reduced-sodium tomato and vegetable juices. Low-sodium or reduced-sodium tomato sauce and paste. Low-sodium or reduced-sodium canned vegetables.  Fruits All fresh, canned (in natural juice), or frozen fruits. Meat and Other Protein Products Ground beef (85% or leaner), grass-fed beef, or beef trimmed of fat. Skinless chicken or [place]. Ground chicken or [place]. Pork trimmed of fat. All fish and seafood. Eggs. Dried beans, peas, or lentils. Unsalted nuts and seeds. Unsalted canned beans. Dairy Low-fat dairy products, such as skim or 1% milk, 2% or reduced-fat cheeses, low-fat ricotta or cottage cheese, or plain low-fat yogurt. Low-sodium or reduced-sodium cheeses. Fats and Oils Tub margarines without trans fats. Light or reduced-fat mayonnaise and salad dressings (reduced sodium). Avocado. Safflower, olive, or canola oils. Natural peanut or almond butter. Other Unsalted popcorn and pretzels. The items listed above may not be a complete list of recommended foods or beverages. Contact your dietitian for more options. WHAT FOODS ARE NOT RECOMMENDED? Grains White bread. White pasta. White rice. Refined cornbread. Bagels and croissants. Crackers that contain trans fat. Vegetables Creamed or fried vegetables. Vegetables in a cheese sauce. Regular canned vegetables. Regular canned tomato sauce and paste. Regular tomato and vegetable juices. Fruits Dried fruits. Canned fruit in light or heavy  syrup. Fruit juice. Meat and Other Protein Products Fatty cuts of meat. Ribs, chicken wings, bacon, sausage, bologna, salami, chitterlings, fatback, hot dogs, bratwurst, and packaged luncheon  meats. Salted nuts and seeds. Canned beans with salt. Dairy Whole or 2% milk, cream, half-and-half, and cream cheese. Whole-fat or sweetened yogurt. Full-fat cheeses or blue cheese. Nondairy creamers and whipped toppings. Processed cheese, cheese spreads, or cheese curds. Condiments Onion and garlic salt, seasoned salt, table salt, and sea salt. Canned and packaged gravies. Worcestershire sauce. Tartar sauce. Barbecue sauce. Teriyaki sauce. Soy sauce, including reduced sodium. Steak sauce. Fish sauce. Oyster sauce. Cocktail sauce. Horseradish. Ketchup and mustard. Meat flavorings and tenderizers. Bouillon cubes. Hot sauce. Tabasco sauce. Marinades. Taco seasonings. Relishes. Fats and Oils Butter, stick margarine, lard, shortening, ghee, and bacon fat. Coconut, palm kernel, or palm oils. Regular salad dressings. Other Pickles and olives. Salted popcorn and pretzels. The items listed above may not be a complete list of foods and beverages to avoid. Contact your dietitian for more information. WHERE CAN I FIND MORE INFORMATION? National Heart, Lung, and Blood Institute: travelstabloid.com   This information is not intended to replace advice given to you by your health care provider. Make sure you discuss any questions you have with your health care provider.   Document Released: 04/04/2011 Document Revised: 05/06/2014 Document Reviewed: 02/17/2013 Elsevier Interactive Patient Education [employer].

## 2015-07-05 NOTE — Progress Notes (Signed)
Pre visit review using our clinic review tool, if applicable. No additional management support is needed unless otherwise documented below in the visit note. 

## 2015-07-05 NOTE — Progress Notes (Signed)
Patient presents to clinic today for follow-up of hypertension after lisinopril 20 mg was started. Endorses taking as directed. Patient denies chest pain, palpitations, lightheadedness, dizziness, vision changes or frequent headaches. Is using Chantix as directed. Is already down to half the amount of daily cigarettes. Denies side effect of medication. Has just joined the gym and will begin going 3-4 times per week.  Past Medical History  Diagnosis Date  . Cervical adenopathy     Steel plate C5-C6  . Seasonal allergies   . Environmental allergies   . History of chicken pox   . Measles   . Mumps   . Hyperglycemia     Borderline Daibetes  . Increased pressure in the eye     Current Outpatient Prescriptions on File Prior to Visit  Medication Sig Dispense Refill  . albuterol (PROVENTIL HFA;VENTOLIN HFA) 108 (90 Base) MCG/ACT inhaler Inhale 2 puffs into the lungs every 6 (six) hours as needed for wheezing or shortness of breath. 1 Inhaler 0  . Ibuprofen 200 MG CAPS Take by mouth as needed.    Marland Kitchen lisinopril (PRINIVIL,ZESTRIL) 20 MG tablet Take 1 tablet (20 mg total) by mouth daily. 90 tablet 3  . varenicline (CHANTIX PAK) 0.5 MG X 11 & 1 MG X 42 tablet Take one 0.5 mg tablet by mouth once daily for 3 days, then increase to one 0.5 mg tablet twice daily for 4 days, then increase to one 1 mg tablet twice daily. 53 tablet 0   No current facility-administered medications on file prior to visit.    No Known Allergies  Family History  Problem Relation Age of Onset  . Brain cancer Mother 62    Deceased  . Liver cancer Mother     Mets  . Anxiety disorder Mother   . Colon cancer Father 35    Deceased  . Alcoholism Father   . Cancer Other     Aunts & Uncles  . Leukemia Sister   . Leukemia Cousin   . Hypertension Brother   . Mental illness Brother     Social History   Social History  . Marital Status: Married    Spouse Name: N/A  . Number of Children: N/A  . Years of Education:  N/A   Social History Main Topics  . Smoking status: Current Every Day Smoker -- 1.00 packs/day for 3 years    Types: Cigarettes  . Smokeless tobacco: None  . Alcohol Use: 3.0 oz/week    5 Standard drinks or equivalent per week     Comment: beer only  . Drug Use: No  . Sexual Activity:    Partners: Female   Other Topics Concern  . None   Social History Narrative   Review of Systems - See HPI.  All other ROS are negative.  BP 130/60 mmHg  Pulse 84  Temp(Src) 98.1 F (36.7 C) (Oral)  Resp 14  Ht _0  (1.727 m)  Wt 247 lb 6.4 oz (112.22 kg)  BMI 37.63 kg/m2  SpO2 93%  Physical Exam  Constitutional: He is oriented to person, place, and time and well-developed, well-nourished, and in no distress.  HENT:  Head: Normocephalic and atraumatic.  Eyes: Conjunctivae are normal.  Neck: Neck supple.  Cardiovascular: Normal rate, regular rhythm, normal heart sounds and intact distal pulses.   Pulmonary/Chest: Effort normal and breath sounds normal. No respiratory distress. He has no wheezes. He has no rales. He exhibits no tenderness.  Neurological: He is alert and oriented  to person, place, and time.  Skin: Skin is warm and dry.  Psychiatric: Affect normal.  Vitals reviewed.   Recent Results (from the past 2160 hour(s))  Lipid panel     Status: Abnormal   Collection Time: 07/05/15  7:31 AM  Result Value Ref Range   Cholesterol 191 0 - 200 mg/dL    Comment: ATP III Classification       Desirable:  < 200 mg/dL               Borderline High:  200 - 239 mg/dL          High:  > = 240 mg/dL   Triglycerides 113.0 0.0 - 149.0 mg/dL    Comment: Normal:  <150 mg/dLBorderline High:  150 - 199 mg/dL   HDL 40.50 >39.00 mg/dL   VLDL 22.6 0.0 - 40.0 mg/dL   LDL Cholesterol 128 (H) 0 - 99 mg/dL   Total CHOL/HDL Ratio 5     Comment:                Men          Women1/2 Average Risk     3.4          3.3Average Risk          5.0          4.42X Average Risk          9.6          7.13X Average  Risk          15.0          11.0                       NonHDL 150.44     Comment: NOTE:  Non-HDL goal should be 30 mg/dL higher than patient's LDL goal (i.e. LDL goal of < 70 mg/dL, would have non-HDL goal of < 100 mg/dL)  Comp Met (CMET)     Status: Abnormal   Collection Time: 07/05/15  7:31 AM  Result Value Ref Range   Sodium 133 (L) 135 - 145 mEq/L   Potassium 4.5 3.5 - 5.1 mEq/L   Chloride 98 96 - 112 mEq/L   CO2 29 19 - 32 mEq/L   Glucose, Bld 186 (H) 70 - 99 mg/dL   BUN 13 6 - 23 mg/dL   Creatinine, Ser 0.73 0.40 - 1.50 mg/dL   Total Bilirubin 0.7 0.2 - 1.2 mg/dL   Alkaline Phosphatase 69 39 - 117 U/L   AST 21 0 - 37 U/L   ALT 37 0 - 53 U/L   Total Protein 7.0 6.0 - 8.3 g/dL   Albumin 4.6 3.5 - 5.2 g/dL   Calcium 9.4 8.4 - 10.5 mg/dL   GFR 116.13 >60.00 mL/min  Hemoglobin A1c     Status: Abnormal   Collection Time: 07/05/15  7:31 AM  Result Value Ref Range   Hgb A1c MFr Bld 6.7 (H) 4.6 - 6.5 %    Comment: Glycemic Control Guidelines for People with Diabetes:Non Diabetic:  <6%Goal of Therapy: <7%Additional Action Suggested:  >8%     Assessment/Plan: Essential hypertension BP improved. Asymptomatic. Will continue current regimen. Patient to work on exercise. Will continue smoking cessation efforts. Will check CMP, Lipid panel and A1C today. Will begin 81 mg ASA daily. Follow-up scheduled.

## 2015-07-09 ENCOUNTER — Encounter: Payer: Self-pay | Admitting: Physician Assistant

## 2015-07-09 NOTE — Assessment & Plan Note (Signed)
BP improved. Asymptomatic. Will continue current regimen. Patient to work on exercise. Will continue smoking cessation efforts. Will check CMP, Lipid panel and A1C today. Will begin 81 mg ASA daily. Follow-up scheduled.

## 2015-07-10 ENCOUNTER — Encounter: Payer: Self-pay | Admitting: Physician Assistant

## 2015-07-12 ENCOUNTER — Other Ambulatory Visit: Payer: Self-pay | Admitting: Physician Assistant

## 2015-07-12 MED ORDER — ATORVASTATIN CALCIUM 10 MG PO TABS: 10.0000 mg | ORAL_TABLET | Freq: Every day | ORAL | Status: DC

## 2015-10-03 ENCOUNTER — Encounter: Payer: Self-pay | Admitting: Physician Assistant

## 2015-10-03 ENCOUNTER — Ambulatory Visit (INDEPENDENT_AMBULATORY_CARE_PROVIDER_SITE_OTHER): Payer: Managed Care, Other (non HMO) | Admitting: Physician Assistant

## 2015-10-03 VITALS — BP 150/68 | HR 84 | Temp 98.1°F | Resp 16 | Ht 68.0 in | Wt 245.4 lb

## 2015-10-03 DIAGNOSIS — B9689 Other specified bacterial agents as the cause of diseases classified elsewhere: Secondary | ICD-10-CM

## 2015-10-03 DIAGNOSIS — J019 Acute sinusitis, unspecified: Secondary | ICD-10-CM | POA: Diagnosis not present

## 2015-10-03 DIAGNOSIS — J4531 Mild persistent asthma with (acute) exacerbation: Secondary | ICD-10-CM | POA: Diagnosis not present

## 2015-10-03 MED ORDER — METHYLPREDNISOLONE 4 MG PO TBPK: ORAL_TABLET | ORAL | Status: DC

## 2015-10-03 MED ORDER — DOXYCYCLINE HYCLATE 100 MG PO CAPS: 100.0000 mg | ORAL_CAPSULE | Freq: Two times a day (BID) | ORAL | Status: DC

## 2015-10-03 NOTE — Progress Notes (Signed)
Pre visit review using our clinic review tool, if applicable. No additional management support is needed unless otherwise documented below in the visit note/SLS  

## 2015-10-03 NOTE — Patient Instructions (Signed)
Please take antibiotic as directed. Take the steroid pack as directed  Increase fluid intake.  Use Saline nasal spray.  Take a daily multivitamin. Continue chronic medications.  Place a humidifier in the bedroom.    I am holding off on imaging today since oxygen level was at baseline and lungs sounded good overall except for mild wheeze.  If symptoms not improving within 48 hours, please call me and we will proceed with imaging.  Sinusitis Sinusitis is redness, soreness, and swelling (inflammation) of the paranasal sinuses. Paranasal sinuses are air pockets within the bones of your face (beneath the eyes, the middle of the forehead, or above the eyes). In healthy paranasal sinuses, mucus is able to drain out, and air is able to circulate through them by way of your nose. However, when your paranasal sinuses are inflamed, mucus and air can become trapped. This can allow bacteria and other germs to grow and cause infection. Sinusitis can develop quickly and last only a short time (acute) or continue over a long period (chronic). Sinusitis that lasts for more than 12 weeks is considered chronic.  CAUSES  Causes of sinusitis include:  Allergies.  Structural abnormalities, such as displacement of the cartilage that separates your nostrils (deviated septum), which can decrease the air flow through your nose and sinuses and affect sinus drainage.  Functional abnormalities, such as when the small hairs (cilia) that line your sinuses and help remove mucus do not work properly or are not present. SYMPTOMS  Symptoms of acute and chronic sinusitis are the same. The primary symptoms are pain and pressure around the affected sinuses. Other symptoms include:  Upper toothache.  Earache.  Headache.  Bad breath.  Decreased sense of smell and taste.  A cough, which worsens when you are lying flat.  Fatigue.  Fever.  Thick drainage from your nose, which often is green and may contain pus  (purulent).  Swelling and warmth over the affected sinuses. DIAGNOSIS  Your caregiver will perform a physical exam. During the exam, your caregiver may:  Look in your nose for signs of abnormal growths in your nostrils (nasal polyps).  Tap over the affected sinus to check for signs of infection.  View the inside of your sinuses (endoscopy) with a special imaging device with a light attached (endoscope), which is inserted into your sinuses. If your caregiver suspects that you have chronic sinusitis, one or more of the following tests may be recommended:  Allergy tests.  Nasal culture A sample of mucus is taken from your nose and sent to a lab and screened for bacteria.  Nasal cytology A sample of mucus is taken from your nose and examined by your caregiver to determine if your sinusitis is related to an allergy. TREATMENT  Most cases of acute sinusitis are related to a viral infection and will resolve on their own within 10 days. Sometimes medicines are prescribed to help relieve symptoms (pain medicine, decongestants, nasal steroid sprays, or saline sprays).  However, for sinusitis related to a bacterial infection, your caregiver will prescribe antibiotic medicines. These are medicines that will help kill the bacteria causing the infection.  Rarely, sinusitis is caused by a fungal infection. In theses cases, your caregiver will prescribe antifungal medicine. For some cases of chronic sinusitis, surgery is needed. Generally, these are cases in which sinusitis recurs more than 3 times per year, despite other treatments. HOME CARE INSTRUCTIONS   Drink plenty of water. Water helps thin the mucus so your sinuses can drain  more easily.  Use a humidifier.  Inhale steam 3 to 4 times a day (for example, sit in the bathroom with the shower running).  Apply a warm, moist washcloth to your face 3 to 4 times a day, or as directed by your caregiver.  Use saline nasal sprays to help moisten and  clean your sinuses.  Take over-the-counter or prescription medicines for pain, discomfort, or fever only as directed by your caregiver. SEEK IMMEDIATE MEDICAL CARE IF:  You have increasing pain or severe headaches.  You have nausea, vomiting, or drowsiness.  You have swelling around your face.  You have vision problems.  You have a stiff neck.  You have difficulty breathing. MAKE SURE YOU:   Understand these instructions.  Will watch your condition.  Will get help right away if you are not doing well or get worse. Document Released: 04/15/2005 Document Revised: 07/08/2011 Document Reviewed: 04/30/2011 [facility] Patient Information 2014 [place], [place].

## 2015-10-03 NOTE — Progress Notes (Signed)
Patient presents to clinic today c/o chest congestion, cough that is intermittent, mainly in the morning, described as once productive but now dry. Denies chest pain. Notes some SOB with exertion. Denies wheezing. Is using albuterol about twice daily, once before bed. Endorses sinus pressure, sinus congestion, sinus pain. Had tooth pain but none today. Denies checking for fever but notes feeling feverish. Has a history of tobacco abuse, was recently giving Chantix. Stopped due to difficulty with rest. Is still smoking around 1 ppd.    Past Medical History  Diagnosis Date  . Cervical adenopathy     Steel plate C5-C6  . Seasonal allergies   . Environmental allergies   . History of chicken pox   . Measles   . Mumps   . Hyperglycemia     Borderline Daibetes  . Increased pressure in the eye     Current Outpatient Prescriptions on File Prior to Visit  Medication Sig Dispense Refill  . albuterol (PROVENTIL HFA;VENTOLIN HFA) 108 (90 Base) MCG/ACT inhaler Inhale 2 puffs into the lungs every 6 (six) hours as needed for wheezing or shortness of breath. 1 Inhaler 0  . atorvastatin (LIPITOR) 10 MG tablet Take 1 tablet (10 mg total) by mouth daily. 30 tablet 2  . Ibuprofen 200 MG CAPS Take by mouth as needed.    Marland Kitchen lisinopril (PRINIVIL,ZESTRIL) 20 MG tablet Take 1 tablet (20 mg total) by mouth daily. 90 tablet 3   No current facility-administered medications on file prior to visit.    No Known Allergies  Family History  Problem Relation Age of Onset  . Brain cancer Mother 53    Deceased  . Liver cancer Mother     Mets  . Anxiety disorder Mother   . Colon cancer Father 52    Deceased  . Alcoholism Father   . Cancer Other     Aunts & Uncles  . Leukemia Sister   . Leukemia Cousin   . Hypertension Brother   . Mental illness Brother     Social History   Social History  . Marital Status: Married    Spouse Name: N/A  . Number of Children: N/A  . Years of Education: N/A   Social  History Main Topics  . Smoking status: Current Every Day Smoker -- 1.00 packs/day for 3 years    Types: Cigarettes  . Smokeless tobacco: None  . Alcohol Use: 3.0 oz/week    5 Standard drinks or equivalent per week     Comment: beer only  . Drug Use: No  . Sexual Activity:    Partners: Female   Other Topics Concern  . None   Social History Narrative   Review of Systems - See HPI.  All other ROS are negative.  BP 150/68 mmHg  Pulse 84  Temp(Src) 98.1 F (36.7 C) (Oral)  Resp 16  Ht 5\' 8"  (1.727 m)  Wt 245 lb 6 oz (111.301 kg)  BMI 37.32 kg/m2  SpO2 94%  Physical Exam  HENT:  Head: Normocephalic and atraumatic.  Right Ear: Tympanic membrane normal.  Nose: Mucosal edema present. Left sinus exhibits maxillary sinus tenderness.  Mouth/Throat: Uvula is midline, oropharynx is clear and moist and mucous membranes are normal.  Eyes: Conjunctivae are normal.  Neck: Neck supple.  Cardiovascular: Normal rate, normal heart sounds and intact distal pulses.   Pulmonary/Chest: Effort normal. No respiratory distress. He has wheezes. He has no rales. He exhibits no tenderness.  Lymphadenopathy:    He has no  cervical adenopathy.  Neurological: He is alert.  Skin: Skin is warm and dry. No rash noted.  Psychiatric: Affect normal.  Vitals reviewed.  Assessment/Plan: 1. Acute bacterial sinusitis Rx Doxycycline.  Increase fluids.  Rest.  Saline nasal spray.  Probiotic.  Mucinex as directed.  Humidifier in bedroom.  Call or return to clinic if symptoms are not improving.  - doxycycline (VIBRAMYCIN) 100 MG capsule; Take 1 capsule (100 mg total) by mouth 2 (two) times daily.  Dispense: 14 capsule; Refill: 0  2. Asthma with acute exacerbation, mild persistent Rx Medrol pack to start at home to help open airways. Continue albuterol as directed. He is to give thought to letting me set him up with Pulmonary for formal PFT.  - doxycycline (VIBRAMYCIN) 100 MG capsule; Take 1 capsule (100 mg  total) by mouth 2 (two) times daily.  Dispense: 14 capsule; Refill: 0 - methylPREDNISolone (MEDROL DOSEPAK) 4 MG TBPK tablet; Take following package directions  Dispense: 21 tablet; Refill: 0

## 2016-04-22 ENCOUNTER — Encounter (HOSPITAL_BASED_OUTPATIENT_CLINIC_OR_DEPARTMENT_OTHER): Payer: Self-pay | Admitting: Emergency Medicine

## 2016-04-22 ENCOUNTER — Observation Stay (HOSPITAL_BASED_OUTPATIENT_CLINIC_OR_DEPARTMENT_OTHER)
Admission: EM | Admit: 2016-04-22 | Discharge: 2016-04-22 | Discharge status: Against Medical Advice | Payer: Managed Care, Other (non HMO) | Attending: Internal Medicine | Admitting: Internal Medicine

## 2016-04-22 ENCOUNTER — Emergency Department (HOSPITAL_BASED_OUTPATIENT_CLINIC_OR_DEPARTMENT_OTHER): Payer: Managed Care, Other (non HMO)

## 2016-04-22 DIAGNOSIS — F419 Anxiety disorder, unspecified: Secondary | ICD-10-CM | POA: Diagnosis not present

## 2016-04-22 DIAGNOSIS — I10 Essential (primary) hypertension: Secondary | ICD-10-CM | POA: Diagnosis not present

## 2016-04-22 DIAGNOSIS — I2510 Atherosclerotic heart disease of native coronary artery without angina pectoris: Secondary | ICD-10-CM | POA: Insufficient documentation

## 2016-04-22 DIAGNOSIS — J449 Chronic obstructive pulmonary disease, unspecified: Secondary | ICD-10-CM | POA: Insufficient documentation

## 2016-04-22 DIAGNOSIS — R0902 Hypoxemia: Secondary | ICD-10-CM

## 2016-04-22 DIAGNOSIS — R0602 Shortness of breath: Secondary | ICD-10-CM | POA: Diagnosis not present

## 2016-04-22 DIAGNOSIS — F17210 Nicotine dependence, cigarettes, uncomplicated: Secondary | ICD-10-CM | POA: Diagnosis not present

## 2016-04-22 DIAGNOSIS — I2584 Coronary atherosclerosis due to calcified coronary lesion: Secondary | ICD-10-CM | POA: Insufficient documentation

## 2016-04-22 DIAGNOSIS — R079 Chest pain, unspecified: Secondary | ICD-10-CM

## 2016-04-22 DIAGNOSIS — R0789 Other chest pain: Secondary | ICD-10-CM | POA: Diagnosis present

## 2016-04-22 DIAGNOSIS — R0600 Dyspnea, unspecified: Secondary | ICD-10-CM

## 2016-04-22 LAB — BASIC METABOLIC PANEL
ANION GAP: 9 (ref 5–15)
BUN: 15 mg/dL (ref 6–20)
CALCIUM: 9 mg/dL (ref 8.9–10.3)
CO2: 28 mmol/L (ref 22–32)
CREATININE: 0.95 mg/dL (ref 0.61–1.24)
Chloride: 95 mmol/L — ABNORMAL LOW (ref 101–111)
GLUCOSE: 102 mg/dL — AB (ref 65–99)
Potassium: 4.2 mmol/L (ref 3.5–5.1)
Sodium: 132 mmol/L — ABNORMAL LOW (ref 135–145)

## 2016-04-22 LAB — TROPONIN I: TROPONIN I: 0.03 ng/mL — AB (ref <0.03)

## 2016-04-22 LAB — CBC WITH DIFFERENTIAL/PLATELET
BASOS ABS: 0 10*3/uL (ref 0.0–0.1)
BASOS PCT: 1 %
EOS ABS: 0.1 10*3/uL (ref 0.0–0.7)
Eosinophils Relative: 1 %
HCT: 53.3 % — ABNORMAL HIGH (ref 39.0–52.0)
Hemoglobin: 18.8 g/dL — ABNORMAL HIGH (ref 13.0–17.0)
Lymphocytes Relative: 16 %
Lymphs Abs: 1.3 10*3/uL (ref 0.7–4.0)
MCH: 31.7 pg (ref 26.0–34.0)
MCHC: 35.3 g/dL (ref 30.0–36.0)
MCV: 89.9 fL (ref 78.0–100.0)
MONO ABS: 0.7 10*3/uL (ref 0.1–1.0)
MONOS PCT: 9 %
NEUTROS ABS: 5.7 10*3/uL (ref 1.7–7.7)
NEUTROS PCT: 73 %
Platelets: 146 10*3/uL — ABNORMAL LOW (ref 150–400)
RBC: 5.93 MIL/uL — ABNORMAL HIGH (ref 4.22–5.81)
RDW: 13.2 % (ref 11.5–15.5)
WBC: 7.9 10*3/uL (ref 4.0–10.5)

## 2016-04-22 MED ORDER — IPRATROPIUM-ALBUTEROL 0.5-2.5 (3) MG/3ML IN SOLN
3.0000 mL | Freq: Once | RESPIRATORY_TRACT | Status: AC
  Administered 2016-04-22: 3 mL via RESPIRATORY_TRACT
  Filled 2016-04-22: qty 3

## 2016-04-22 MED ORDER — ONDANSETRON HCL 4 MG/2ML IJ SOLN
4.0000 mg | Freq: Once | INTRAMUSCULAR | Status: AC
  Administered 2016-04-22: 4 mg via INTRAVENOUS
  Filled 2016-04-22: qty 2

## 2016-04-22 MED ORDER — ALBUTEROL SULFATE (2.5 MG/3ML) 0.083% IN NEBU
2.5000 mg | INHALATION_SOLUTION | Freq: Once | RESPIRATORY_TRACT | Status: AC
  Administered 2016-04-22: 2.5 mg via RESPIRATORY_TRACT
  Filled 2016-04-22: qty 3

## 2016-04-22 MED ORDER — IOPAMIDOL (ISOVUE-370) INJECTION 76%
100.0000 mL | Freq: Once | INTRAVENOUS | Status: AC | PRN
  Administered 2016-04-22: 100 mL via INTRAVENOUS

## 2016-04-22 MED ORDER — METHYLPREDNISOLONE SODIUM SUCC 125 MG IJ SOLR
125.0000 mg | Freq: Once | INTRAMUSCULAR | Status: AC
  Administered 2016-04-22: 125 mg via INTRAVENOUS
  Filled 2016-04-22: qty 2

## 2016-04-22 MED ORDER — IPRATROPIUM-ALBUTEROL 0.5-2.5 (3) MG/3ML IN SOLN
3.0000 mL | Freq: Four times a day (QID) | RESPIRATORY_TRACT | Status: DC
  Administered 2016-04-22: 3 mL via RESPIRATORY_TRACT
  Filled 2016-04-22: qty 3

## 2016-04-22 NOTE — ED Notes (Signed)
ED Provider at bedside. 

## 2016-04-22 NOTE — Progress Notes (Signed)
Patient accepted for observation with telemetry monitoring for evaluation of chest pain, shortness of breath, and mild hypoxia (O2 sat 86% on RA).  CTA chest negative for PE.  First troponin 0.03.  History of HTN, active tobacco use.  Coronary calcifications seen on CTA chest.  Needs serial troponins, possibly even cardiology consultation regarding CTA findings before committing to stress test.  Underlying COPD is also suspected.  He has received IV solumedrol and duonebs in the ED.  He is on 2L [place], which is new for him, with improved O2 sats to 95%.

## 2016-04-22 NOTE — ED Notes (Signed)
Pt updated on repeat Troponin level. Pt's friend and son in the room at the time. Pt continues to refuse admission. Pt advised SpO2 was still dropping to 89% at times while on 3L O2. Pt advised of the risks of low Oxygen levels can lead to organ dysfunction and death. Pt continues to refuse admission despite being advised multiple times by EDP and this RN. Pt states he wants to follow up with his PCP in the AM. Removed pt's O2 and pt dropped SpO2 to 86% on R/A. EDP aware of pt's status and decision.

## 2016-04-22 NOTE — ED Notes (Signed)
Pt not willing to sign for transfer at this time.  Wants to discuss alternative plan of care.  MD notified.

## 2016-04-22 NOTE — ED Notes (Signed)
Pt SpO2 keeps dipping to 86% on R/A. Pt placed on 2L via N/C at this time.
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
## 2016-04-22 NOTE — ED Notes (Signed)
At this time pt agrees to stay for second troponin to be drawn.

## 2016-04-22 NOTE — ED Notes (Signed)
EKG printed and handed to Dr. Stark Jock.

## 2016-04-22 NOTE — ED Triage Notes (Signed)
Pt states he's here today for high BP and anxiety after his wife took off to [facility] with boyfriend he didn't know about.
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
## 2016-07-02 ENCOUNTER — Other Ambulatory Visit: Payer: Self-pay | Admitting: Physician Assistant

## 2016-07-02 DIAGNOSIS — I10 Essential (primary) hypertension: Secondary | ICD-10-CM

## 2016-08-06 ENCOUNTER — Other Ambulatory Visit: Payer: Self-pay | Admitting: Physician Assistant

## 2016-08-06 DIAGNOSIS — I10 Essential (primary) hypertension: Secondary | ICD-10-CM

## 2016-09-05 ENCOUNTER — Encounter: Payer: Self-pay | Admitting: Emergency Medicine

## 2016-09-06 ENCOUNTER — Other Ambulatory Visit: Payer: Self-pay | Admitting: Physician Assistant

## 2016-09-06 DIAGNOSIS — I10 Essential (primary) hypertension: Secondary | ICD-10-CM

## 2016-09-09 ENCOUNTER — Other Ambulatory Visit: Payer: Self-pay | Admitting: Emergency Medicine

## 2016-09-09 DIAGNOSIS — I10 Essential (primary) hypertension: Secondary | ICD-10-CM

## 2016-09-09 MED ORDER — LISINOPRIL 20 MG PO TABS
20.0000 mg | ORAL_TABLET | Freq: Every day | ORAL | 1 refills | Status: DC
Start: 2016-09-09 — End: 2016-11-19

## 2016-11-04 NOTE — Progress Notes (Signed)
Patient presents to clinic today for annual exam.  Patient is fasting for labs.  Chronic Issues: Tobacco Use Disorder -- Currently working on cessation. Has recently significant decreased alcohol consumption and tobacco use has improved since then. Is down to 1 ppd from 2 ppd. Has previously quit for 2008-2012 by going cold [place]. Would like to consider starting Chantix.   Hypertension -- Currently on a regimen of Lisinopril 20 mg daily. Is taking medication as directed. Patient denies chest pain, palpitations, lightheadedness, dizziness, vision changes or frequent headaches.   Hyperlipidemia -- Previously on a regimen of atorvastatin 10 mg daily. Has not taken in some time. Has been working on diet and exercise recently. Has lost 20 lbs so far with dietary changes.   Health Maintenance: Immunizations -- TDaP up-to-date. Colonoscopy -- 2013. [facility]. Benign polyps. Recall 5 years. Is due. Asymptomatic.Marland Kitchen HIV Screen -- 2-3 years ago. Denies concern for recheck. Hep C Screen -- Patient feels that this has been checked before. Will obtain records from prior PCP.   Past Medical History:  Diagnosis Date  . Cervical adenopathy    Steel plate C5-C6  . Environmental allergies   . History of chicken pox   . Hyperglycemia    Borderline Daibetes  . Increased pressure in the eye   . Measles   . Mumps   . Seasonal allergies     Past Surgical History:  Procedure Laterality Date  . Cervical HaArdware Placement    . CHOLECYSTECTOMY    . COLONOSCOPY     Q5 yrs  . TONSILLECTOMY    . WISDOM TOOTH EXTRACTION      Current Outpatient Prescriptions on File Prior to Visit  Medication Sig Dispense Refill  . albuterol (PROVENTIL HFA;VENTOLIN HFA) 108 (90 Base) MCG/ACT inhaler Inhale 2 puffs into the lungs every 6 (six) hours as needed for wheezing or shortness of breath. 1 Inhaler 0  . Ibuprofen 200 MG CAPS Take by mouth as needed.    Marland Kitchen lisinopril (PRINIVIL,ZESTRIL) 20 MG tablet Take 1 tablet  (20 mg total) by mouth daily. 90 tablet 1  . atorvastatin (LIPITOR) 10 MG tablet Take 1 tablet (10 mg total) by mouth daily. (Patient not taking: Reported on 11/05/2016) 30 tablet 2   No current facility-administered medications on file prior to visit.     No Known Allergies  Family History  Problem Relation Age of Onset  . Brain cancer Mother 31       Deceased  . Liver cancer Mother        Mets  . Anxiety disorder Mother   . Colon cancer Father 51       Deceased  . Alcoholism Father   . Cancer Other        Aunts & Uncles  . Leukemia Sister   . Leukemia Cousin   . Hypertension Brother   . Mental illness Brother     Social History   Social History  . Marital status: Legally Separated    Spouse name: N/A  . Number of children: N/A  . Years of education: N/A   Occupational History  . Not on file.   Social History Main Topics  . Smoking status: Current Every Day Smoker    Packs/day: 1.00    Years: 3.00    Types: Cigarettes  . Smokeless tobacco: Never Used  . Alcohol use 3.0 oz/week    5 Standard drinks or equivalent per week     Comment: beer only  .  Drug use: No  . Sexual activity: Yes    Partners: Female   Other Topics Concern  . Not on file   Social History Narrative  . No narrative on file   Review of Systems  Constitutional: Negative for fever and weight loss.  HENT: Negative for ear discharge, ear pain, hearing loss and tinnitus.   Eyes: Negative for blurred vision, double vision, photophobia and pain.  Respiratory: Negative for cough and shortness of breath.   Cardiovascular: Negative for chest pain and palpitations.  Gastrointestinal: Negative for abdominal pain, blood in stool, constipation, diarrhea, heartburn, melena, nausea and vomiting.  Genitourinary: Negative for dysuria, flank pain, frequency, hematuria and urgency.  Musculoskeletal: Negative for falls.  Neurological: Negative for dizziness, loss of consciousness and headaches.    Endo/Heme/Allergies: Negative for environmental allergies.  Psychiatric/Behavioral: Negative for depression, hallucinations, substance abuse and suicidal ideas. The patient is not nervous/anxious and does not have insomnia.     BP 140/80   Pulse 78   Temp 98.2 F (36.8 C) (Oral)   Resp 14   Ht 5\' 8"  (1.727 m)   Wt 224 lb (101.6 kg)   SpO2 96%   BMI 34.06 kg/m   Physical Exam  Constitutional: He is oriented to person, place, and time and well-developed, well-nourished, and in no distress.  HENT:  Head: Normocephalic and atraumatic.  Right Ear: External ear normal.  Left Ear: External ear normal.  Nose: Nose normal.  Mouth/Throat: Oropharynx is clear and moist. No oropharyngeal exudate.  Eyes: Conjunctivae and EOM are normal. Pupils are equal, round, and reactive to light.  Neck: Neck supple. No thyromegaly present.  Cardiovascular: Normal rate, regular rhythm, normal heart sounds and intact distal pulses.   Pulmonary/Chest: Effort normal and breath sounds normal. No respiratory distress. He has no wheezes. He has no rales. He exhibits no tenderness.  Abdominal: Soft. Bowel sounds are normal. He exhibits no distension and no mass. There is no tenderness. There is no rebound and no guarding.  Genitourinary: Testes/scrotum normal.  Lymphadenopathy:    He has no cervical adenopathy.  Neurological: He is alert and oriented to person, place, and time.  Skin: Skin is warm and dry. No rash noted.  Psychiatric: Affect normal.  Vitals reviewed.  Assessment/Plan: Visit for preventive health examination Depression screen negative. Health Maintenance reviewed -- Declines repeat HIV screen. Will check prior records regarding Hep C screen. Preventive schedule discussed and handout given in AVS. Will obtain fasting labs today.   Tobacco abuse disorder Will restart Chantix. Supportive measures reviewed.  Prostate cancer screening The natural history of prostate cancer and ongoing  controversy regarding screening and potential treatment outcomes of prostate cancer has been discussed with the patient. The meaning of a false positive PSA and a false negative PSA has been discussed. He indicates understanding of the limitations of this screening test and wishes to proceed with screening PSA testing.   Hyperlipidemia Patient commended on lifestyle changes and weight loss. Will repeat labs today. Will alter medication based on results.   Essential hypertension Above goal today. Significant recent stressors. No medication today. DASH diet encouraged. Labs today to assess. Continue current dose of Lisinopril. Follow-up 2 weeks for BP check.  Wheezing Mild. + history of tobacco use. Working on cessation. Albuterol refilled. Referral to Pulmonology placed for PFTs and further management.     Leeanne Rio, PA-C

## 2016-11-05 ENCOUNTER — Ambulatory Visit (INDEPENDENT_AMBULATORY_CARE_PROVIDER_SITE_OTHER): Payer: Managed Care, Other (non HMO) | Admitting: Physician Assistant

## 2016-11-05 ENCOUNTER — Encounter: Payer: Self-pay | Admitting: Physician Assistant

## 2016-11-05 VITALS — BP 140/80 | HR 78 | Temp 98.2°F | Resp 14 | Ht 68.0 in | Wt 224.0 lb

## 2016-11-05 DIAGNOSIS — Z0000 Encounter for general adult medical examination without abnormal findings: Secondary | ICD-10-CM | POA: Diagnosis not present

## 2016-11-05 DIAGNOSIS — Z125 Encounter for screening for malignant neoplasm of prostate: Secondary | ICD-10-CM

## 2016-11-05 DIAGNOSIS — Z720 Tobacco use: Secondary | ICD-10-CM | POA: Diagnosis not present

## 2016-11-05 DIAGNOSIS — E785 Hyperlipidemia, unspecified: Secondary | ICD-10-CM | POA: Insufficient documentation

## 2016-11-05 DIAGNOSIS — I10 Essential (primary) hypertension: Secondary | ICD-10-CM | POA: Diagnosis not present

## 2016-11-05 DIAGNOSIS — R062 Wheezing: Secondary | ICD-10-CM

## 2016-11-05 HISTORY — DX: Hyperlipidemia, unspecified: E78.5

## 2016-11-05 LAB — LIPID PANEL
CHOLESTEROL: 175 mg/dL (ref 0–200)
HDL: 48.7 mg/dL (ref >39.00)
LDL CALC: 101 mg/dL — AB (ref 0–99)
NONHDL: 125.8
Total CHOL/HDL Ratio: 4
Triglycerides: 123 mg/dL (ref 0.0–149.0)
VLDL: 24.6 mg/dL (ref 0.0–40.0)

## 2016-11-05 LAB — COMPREHENSIVE METABOLIC PANEL
ALBUMIN: 4.5 g/dL (ref 3.5–5.2)
ALK PHOS: 61 U/L (ref 39–117)
ALT: 20 U/L (ref 0–53)
AST: 15 U/L (ref 0–37)
BUN: 11 mg/dL (ref 6–23)
CALCIUM: 9.5 mg/dL (ref 8.4–10.5)
CHLORIDE: 99 meq/L (ref 96–112)
CO2: 31 mEq/L (ref 19–32)
Creatinine, Ser: 0.87 mg/dL (ref 0.40–1.50)
GFR: 94.42 mL/min (ref >60.00)
Glucose, Bld: 111 mg/dL — ABNORMAL HIGH (ref 70–99)
Potassium: 4.6 mEq/L (ref 3.5–5.1)
Sodium: 137 mEq/L (ref 135–145)
TOTAL PROTEIN: 7 g/dL (ref 6.0–8.3)
Total Bilirubin: 0.8 mg/dL (ref 0.2–1.2)

## 2016-11-05 LAB — CBC WITH DIFFERENTIAL/PLATELET
Basophils Absolute: 0.1 10*3/uL (ref 0.0–0.1)
Basophils Relative: 0.9 % (ref 0.0–3.0)
Eosinophils Absolute: 0.1 10*3/uL (ref 0.0–0.7)
Eosinophils Relative: 1.1 % (ref 0.0–5.0)
HEMATOCRIT: 50.7 % (ref 39.0–52.0)
HEMOGLOBIN: 17.5 g/dL — AB (ref 13.0–17.0)
LYMPHS PCT: 22.9 % (ref 12.0–46.0)
Lymphs Abs: 1.4 10*3/uL (ref 0.7–4.0)
MCHC: 34.6 g/dL (ref 30.0–36.0)
MCV: 95.3 fl (ref 78.0–100.0)
MONO ABS: 0.6 10*3/uL (ref 0.1–1.0)
Monocytes Relative: 9.5 % (ref 3.0–12.0)
Neutro Abs: 4.1 10*3/uL (ref 1.4–7.7)
Neutrophils Relative %: 65.6 % (ref 43.0–77.0)
Platelets: 136 10*3/uL — ABNORMAL LOW (ref 150.0–400.0)
RBC: 5.32 Mil/uL (ref 4.22–5.81)
RDW: 13.5 % (ref 11.5–15.5)
WBC: 6.2 10*3/uL (ref 4.0–10.5)

## 2016-11-05 LAB — URINALYSIS, ROUTINE W REFLEX MICROSCOPIC
BILIRUBIN URINE: NEGATIVE
HGB URINE DIPSTICK: NEGATIVE
Ketones, ur: NEGATIVE
LEUKOCYTES UA: NEGATIVE
NITRITE: NEGATIVE
RBC / HPF: NONE SEEN (ref 0–?)
Specific Gravity, Urine: 1.01 (ref 1.000–1.030)
Total Protein, Urine: NEGATIVE
Urine Glucose: NEGATIVE
Urobilinogen, UA: 0.2 (ref 0.0–1.0)
pH: 6 (ref 5.0–8.0)

## 2016-11-05 LAB — PSA: PSA: 0.59 ng/mL (ref 0.10–4.00)

## 2016-11-05 LAB — TSH: TSH: 1.05 u[IU]/mL (ref 0.35–4.50)

## 2016-11-05 LAB — HEMOGLOBIN A1C: HEMOGLOBIN A1C: 6.2 % (ref 4.6–6.5)

## 2016-11-05 MED ORDER — VARENICLINE TARTRATE 0.5 MG X 11 & 1 MG X 42 PO MISC: ORAL | 0 refills | Status: DC

## 2016-11-05 NOTE — Patient Instructions (Signed)
Please go to the lab for blood work.   Our office will call you with your results unless you have chosen to receive results via MyChart.  If your blood work is normal we will follow-up each year for physicals and as scheduled for chronic medical problems.  If anything is abnormal we will treat accordingly and get you in for a follow-up.  Start the Chantix as directed for smoking cessation.  Follow the diet below to help with BP. Follow-up in 2 weeks for a BP check.   DASH Eating Plan DASH stands for "Dietary Approaches to Stop Hypertension." The DASH eating plan is a healthy eating plan that has been shown to reduce high blood pressure (hypertension). It may also reduce your risk for type 2 diabetes, heart disease, and stroke. The DASH eating plan may also help with weight loss. What are tips for following this plan? General guidelines  Avoid eating more than 2,300 mg (milligrams) of salt (sodium) a day. If you have hypertension, you may need to reduce your sodium intake to 1,500 mg a day.  Limit alcohol intake to no more than 1 drink a day for nonpregnant women and 2 drinks a day for men. One drink equals 12 oz of beer, 5 oz of wine, or 1 oz of hard liquor.  Work with your health care provider to maintain a healthy body weight or to lose weight. Ask what an ideal weight is for you.  Get at least 30 minutes of exercise that causes your heart to beat faster (aerobic exercise) most days of the week. Activities may include walking, swimming, or biking.  Work with your health care provider or diet and nutrition specialist (dietitian) to adjust your eating plan to your individual calorie needs. Reading food labels  Check food labels for the amount of sodium per serving. Choose foods with less than 5 percent of the Daily Value of sodium. Generally, foods with less than 300 mg of sodium per serving fit into this eating plan.  To find whole grains, look for the word "whole" as the first word  in the ingredient list. Shopping  Buy products labeled as "low-sodium" or "no salt added."  Buy fresh foods. Avoid canned foods and premade or frozen meals. Cooking  Avoid adding salt when cooking. Use salt-free seasonings or herbs instead of table salt or sea salt. Check with your health care provider or pharmacist before using salt substitutes.  Do not fry foods. Cook foods using healthy methods such as baking, boiling, grilling, and broiling instead.  Cook with heart-healthy oils, such as olive, canola, soybean, or sunflower oil. Meal planning   Eat a balanced diet that includes: ? 5 or more servings of fruits and vegetables each day. At each meal, try to fill half of your plate with fruits and vegetables. ? Up to 6-8 servings of whole grains each day. ? Less than 6 oz of lean meat, poultry, or fish each day. A 3-oz serving of meat is about the same size as a deck of cards. One egg equals 1 oz. ? 2 servings of low-fat dairy each day. ? A serving of nuts, seeds, or beans 5 times each week. ? Heart-healthy fats. Healthy fats called Omega-3 fatty acids are found in foods such as flaxseeds and coldwater fish, like sardines, salmon, and mackerel.  Limit how much you eat of the following: ? Canned or prepackaged foods. ? Food that is high in trans fat, such as fried foods. ? Food that is  high in saturated fat, such as fatty meat. ? Sweets, desserts, sugary drinks, and other foods with added sugar. ? Full-fat dairy products.  Do not salt foods before eating.  Try to eat at least 2 vegetarian meals each week.  Eat more home-cooked food and less restaurant, buffet, and fast food.  When eating at a restaurant, ask that your food be prepared with less salt or no salt, if possible. What foods are recommended? The items listed may not be a complete list. Talk with your dietitian about what dietary choices are best for you. Grains Whole-grain or whole-wheat bread. Whole-grain or  whole-wheat pasta. Brown rice. [place]. Bulgur. Whole-grain and low-sodium cereals. Pita bread. Low-fat, low-sodium crackers. Whole-wheat flour tortillas. Vegetables Fresh or frozen vegetables (raw, steamed, roasted, or grilled). Low-sodium or reduced-sodium tomato and vegetable juice. Low-sodium or reduced-sodium tomato sauce and tomato paste. Low-sodium or reduced-sodium canned vegetables. Fruits All fresh, dried, or frozen fruit. Canned fruit in natural juice (without added sugar). Meat and other protein foods Skinless chicken or [place]. Ground chicken or [place]. Pork with fat trimmed off. Fish and seafood. Egg whites. Dried beans, peas, or lentils. Unsalted nuts, nut butters, and seeds. Unsalted canned beans. Lean cuts of beef with fat trimmed off. Low-sodium, lean deli meat. Dairy Low-fat (1%) or fat-free (skim) milk. Fat-free, low-fat, or reduced-fat cheeses. Nonfat, low-sodium ricotta or cottage cheese. Low-fat or nonfat yogurt. Low-fat, low-sodium cheese. Fats and oils Soft margarine without trans fats. Vegetable oil. Low-fat, reduced-fat, or light mayonnaise and salad dressings (reduced-sodium). Canola, safflower, olive, soybean, and sunflower oils. Avocado. Seasoning and other foods Herbs. Spices. Seasoning mixes without salt. Unsalted popcorn and pretzels. Fat-free sweets. What foods are not recommended? The items listed may not be a complete list. Talk with your dietitian about what dietary choices are best for you. Grains Baked goods made with fat, such as croissants, muffins, or some breads. Dry pasta or rice meal packs. Vegetables Creamed or fried vegetables. Vegetables in a cheese sauce. Regular canned vegetables (not low-sodium or reduced-sodium). Regular canned tomato sauce and paste (not low-sodium or reduced-sodium). Regular tomato and vegetable juice (not low-sodium or reduced-sodium). Angie Fava. Olives. Fruits Canned fruit in a light or heavy syrup. Fried fruit. Fruit  in cream or butter sauce. Meat and other protein foods Fatty cuts of meat. Ribs. Fried meat. Berniece Salines. Sausage. Bologna and other processed lunch meats. Salami. Fatback. Hotdogs. Bratwurst. Salted nuts and seeds. Canned beans with added salt. Canned or smoked fish. Whole eggs or egg yolks. Chicken or [place] with skin. Dairy Whole or 2% milk, cream, and half-and-half. Whole or full-fat cream cheese. Whole-fat or sweetened yogurt. Full-fat cheese. Nondairy creamers. Whipped toppings. Processed cheese and cheese spreads. Fats and oils Butter. Stick margarine. Lard. Shortening. Ghee. Bacon fat. Tropical oils, such as coconut, palm kernel, or palm oil. Seasoning and other foods Salted popcorn and pretzels. Onion salt, garlic salt, seasoned salt, table salt, and sea salt. Worcestershire sauce. Tartar sauce. Barbecue sauce. Teriyaki sauce. Soy sauce, including reduced-sodium. Steak sauce. Canned and packaged gravies. Fish sauce. Oyster sauce. Cocktail sauce. Horseradish that you find on the shelf. Ketchup. Mustard. Meat flavorings and tenderizers. Bouillon cubes. Hot sauce and Tabasco sauce. Premade or packaged marinades. Premade or packaged taco seasonings. Relishes. Regular salad dressings. Where to find more information:  National Heart, Lung, and [place]: https://wilson-eaton.com/  American Heart Association: www.heart.org Summary  The DASH eating plan is a healthy eating plan that has been shown to reduce high blood pressure (hypertension). It  may also reduce your risk for type 2 diabetes, heart disease, and stroke.  With the DASH eating plan, you should limit salt (sodium) intake to 2,300 mg a day. If you have hypertension, you may need to reduce your sodium intake to 1,500 mg a day.  When on the DASH eating plan, aim to eat more fresh fruits and vegetables, whole grains, lean proteins, low-fat dairy, and heart-healthy fats.  Work with your health care provider or diet and nutrition specialist  (dietitian) to adjust your eating plan to your individual calorie needs. This information is not intended to replace advice given to you by your health care provider. Make sure you discuss any questions you have with your health care provider. Document Released: 04/04/2011 Document Revised: 04/08/2016 Document Reviewed: 04/08/2016 Elsevier Interactive Patient Education  2017 [place] Years, Male Preventive care refers to lifestyle choices and visits with your health care provider that can promote health and wellness. What does preventive care include?  A yearly physical exam. This is also called an annual well check.  Dental exams once or twice a year.  Routine eye exams. Ask your health care provider how often you should have your eyes checked.  Personal lifestyle choices, including: ? Daily care of your teeth and gums. ? Regular physical activity. ? Eating a healthy diet. ? Avoiding tobacco and drug use. ? Limiting alcohol use. ? Practicing safe sex. ? Taking low-dose aspirin every day starting at age 24. What happens during an annual well check? The services and screenings done by your health care provider during your annual well check will depend on your age, overall health, lifestyle risk factors, and family history of disease. Counseling Your health care provider may ask you questions about your:  Alcohol use.  Tobacco use.  Drug use.  Emotional well-being.  Home and relationship well-being.  Sexual activity.  Eating habits.  Work and work Statistician.  Screening You may have the following tests or measurements:  Height, weight, and BMI.  Blood pressure.  Lipid and cholesterol levels. These may be checked every 5 years, or more frequently if you are over 69 years old.  Skin check.  Lung cancer screening. You may have this screening every year starting at age 75 if you have a 30-pack-year history of smoking and currently smoke  or have quit within the past 15 years.  Fecal occult blood test (FOBT) of the stool. You may have this test every year starting at age 42.  Flexible sigmoidoscopy or colonoscopy. You may have a sigmoidoscopy every 5 years or a colonoscopy every 10 years starting at age 35.  Prostate cancer screening. Recommendations will vary depending on your family history and other risks.  Hepatitis C blood test.  Hepatitis B blood test.  Sexually transmitted disease (STD) testing.  Diabetes screening. This is done by checking your blood sugar (glucose) after you have not eaten for a while (fasting). You may have this done every 1-3 years.  Discuss your test results, treatment options, and if necessary, the need for more tests with your health care provider. Vaccines Your health care provider may recommend certain vaccines, such as:  Influenza vaccine. This is recommended every year.  Tetanus, diphtheria, and acellular pertussis (Tdap, Td) vaccine. You may need a Td booster every 10 years.  Varicella vaccine. You may need this if you have not been vaccinated.  Zoster vaccine. You may need this after age 44.  Measles, mumps, and rubella (MMR) vaccine.  You may need at least one dose of MMR if you were born in 1957 or later. You may also need a second dose.  Pneumococcal 13-valent conjugate (PCV13) vaccine. You may need this if you have certain conditions and have not been vaccinated.  Pneumococcal polysaccharide (PPSV23) vaccine. You may need one or two doses if you smoke cigarettes or if you have certain conditions.  Meningococcal vaccine. You may need this if you have certain conditions.  Hepatitis A vaccine. You may need this if you have certain conditions or if you travel or work in places where you may be exposed to hepatitis A.  Hepatitis B vaccine. You may need this if you have certain conditions or if you travel or work in places where you may be exposed to hepatitis B.  Haemophilus  influenzae type b (Hib) vaccine. You may need this if you have certain risk factors.  Talk to your health care provider about which screenings and vaccines you need and how often you need them. This information is not intended to replace advice given to you by your health care provider. Make sure you discuss any questions you have with your health care provider. Document Released: 05/12/2015 Document Revised: 01/03/2016 Document Reviewed: 02/14/2015 Elsevier Interactive Patient Education  2017 [employer].  .

## 2016-11-05 NOTE — Assessment & Plan Note (Signed)
Depression screen negative. Health Maintenance reviewed -- Declines repeat HIV screen. Will check prior records regarding Hep C screen. Preventive schedule discussed and handout given in AVS. Will obtain fasting labs today.

## 2016-11-05 NOTE — Assessment & Plan Note (Signed)
Above goal today. Significant recent stressors. No medication today. DASH diet encouraged. Labs today to assess. Continue current dose of Lisinopril. Follow-up 2 weeks for BP check.

## 2016-11-05 NOTE — Assessment & Plan Note (Signed)
Mild. + history of tobacco use. Working on cessation. Albuterol refilled. Referral to Pulmonology placed for PFTs and further management.

## 2016-11-05 NOTE — Assessment & Plan Note (Signed)
Patient commended on lifestyle changes and weight loss. Will repeat labs today. Will alter medication based on results.

## 2016-11-05 NOTE — Assessment & Plan Note (Signed)
The natural history of prostate cancer and ongoing controversy regarding screening and potential treatment outcomes of prostate cancer has been discussed with the patient. The meaning of a false positive PSA and a false negative PSA has been discussed. He indicates understanding of the limitations of this screening test and wishes  to proceed with screening PSA testing.  

## 2016-11-05 NOTE — Progress Notes (Signed)
Pre visit review using our clinic review tool, if applicable. No additional management support is needed unless otherwise documented below in the visit note. 

## 2016-11-05 NOTE — Assessment & Plan Note (Signed)
Will restart Chantix. Supportive measures reviewed.

## 2016-11-07 ENCOUNTER — Other Ambulatory Visit: Payer: Self-pay | Admitting: Physician Assistant

## 2016-11-07 DIAGNOSIS — D582 Other hemoglobinopathies: Secondary | ICD-10-CM

## 2016-11-19 ENCOUNTER — Ambulatory Visit (INDEPENDENT_AMBULATORY_CARE_PROVIDER_SITE_OTHER): Payer: Managed Care, Other (non HMO) | Admitting: Physician Assistant

## 2016-11-19 ENCOUNTER — Encounter: Payer: Self-pay | Admitting: Physician Assistant

## 2016-11-19 VITALS — BP 142/76 | HR 70 | Temp 98.5°F | Resp 14 | Ht 68.0 in | Wt 229.0 lb

## 2016-11-19 DIAGNOSIS — D582 Other hemoglobinopathies: Secondary | ICD-10-CM | POA: Insufficient documentation

## 2016-11-19 DIAGNOSIS — Z720 Tobacco use: Secondary | ICD-10-CM | POA: Diagnosis not present

## 2016-11-19 DIAGNOSIS — I10 Essential (primary) hypertension: Secondary | ICD-10-CM | POA: Diagnosis not present

## 2016-11-19 LAB — CBC
HEMATOCRIT: 51.2 % (ref 39.0–52.0)
Hemoglobin: 17.6 g/dL — ABNORMAL HIGH (ref 13.0–17.0)
MCHC: 34.4 g/dL (ref 30.0–36.0)
MCV: 95.8 fl (ref 78.0–100.0)
Platelets: 152 10*3/uL (ref 150.0–400.0)
RBC: 5.34 Mil/uL (ref 4.22–5.81)
RDW: 13.8 % (ref 11.5–15.5)
WBC: 7 10*3/uL (ref 4.0–10.5)

## 2016-11-19 MED ORDER — ALBUTEROL SULFATE HFA 108 (90 BASE) MCG/ACT IN AERS: 2.0000 | INHALATION_SPRAY | Freq: Four times a day (QID) | RESPIRATORY_TRACT | 3 refills | Status: DC | PRN

## 2016-11-19 MED ORDER — LISINOPRIL-HYDROCHLOROTHIAZIDE 20-12.5 MG PO TABS: 1.0000 | ORAL_TABLET | Freq: Every day | ORAL | 3 refills | Status: DC

## 2016-11-19 NOTE — Assessment & Plan Note (Signed)
Tolerating Chantix well. Down to 1/2 ppd from 2 ppd. Increased energy noted. Continue Chantix. Follow-up scheduled.

## 2016-11-19 NOTE — Assessment & Plan Note (Signed)
Repeat level today. Likely from smoking.

## 2016-11-19 NOTE — Progress Notes (Signed)
Patient presents to clinic today for 2 weeks follow-up regarding hypertension. Patient currently on lisinopril 20 mg daily. Endorses taking as directed. BP was mildly elevated at last visit despite medication. Patient restarted on DASH diet along with his lisinopril.  Was encouraged to check home BP medications and follow-up for reassessment. Patient is not checking BP as directed as he cannot find his BP machine. Is purchasing a new one today. Patient is taking Chantix as directed for smoking cessation. Is tolerating well. Is already down from 2 ppd to 1/2 ppd. Is very happy with results so far.    BP Readings from Last 3 Encounters:  11/19/16 (!) 142/76  11/05/16 140/80  04/22/16 137/62   Past Medical History:  Diagnosis Date  . Cervical adenopathy    Steel plate C5-C6  . Environmental allergies   . History of chicken pox   . Hyperglycemia    Borderline Daibetes  . Increased pressure in the eye   . Measles   . Mumps   . Seasonal allergies     Current Outpatient Prescriptions on File Prior to Visit  Medication Sig Dispense Refill  . Ibuprofen 200 MG CAPS Take by mouth as needed.    . varenicline (CHANTIX STARTING MONTH PAK) 0.5 MG X 11 & 1 MG X 42 tablet Take one 0.5 mg tablet by mouth once daily for 3 days, then increase to one 0.5 mg tablet twice daily for 4 days, then increase to one 1 mg tablet twice daily. 53 tablet 0  . atorvastatin (LIPITOR) 10 MG tablet Take 1 tablet (10 mg total) by mouth daily. (Patient not taking: Reported on 11/19/2016) 30 tablet 2   No current facility-administered medications on file prior to visit.     No Known Allergies  Family History  Problem Relation Age of Onset  . Brain cancer Mother 50       Deceased  . Liver cancer Mother        Mets  . Anxiety disorder Mother   . Colon cancer Father 35       Deceased  . Alcoholism Father   . Cancer Other        Aunts & Uncles  . Leukemia Sister   . Leukemia Cousin   . Hypertension Brother   .  Mental illness Brother     Social History   Social History  . Marital status: Legally Separated    Spouse name: N/A  . Number of children: N/A  . Years of education: N/A   Social History Main Topics  . Smoking status: Current Every Day Smoker    Packs/day: 0.50    Years: 3.00    Types: Cigarettes  . Smokeless tobacco: Never Used  . Alcohol use 3.0 oz/week    5 Standard drinks or equivalent per week     Comment: beer only  . Drug use: No  . Sexual activity: Yes    Partners: Female   Other Topics Concern  . None   Social History Narrative  . None   Review of Systems - See HPI.  All other ROS are negative.  BP (!) 142/76   Pulse 70   Temp 98.5 F (36.9 C) (Oral)   Resp 14   Ht 5\' 8"  (1.727 m)   Wt 229 lb (103.9 kg)   SpO2 97%   BMI 34.82 kg/m   Physical Exam  Constitutional: He is oriented to person, place, and time and well-developed, well-nourished, and in no distress.  HENT:  Head: Normocephalic and atraumatic.  Eyes: Conjunctivae are normal.  Neck: Neck supple.  Cardiovascular: Normal rate, regular rhythm, normal heart sounds and intact distal pulses.   Pulmonary/Chest: Effort normal and breath sounds normal. No respiratory distress. He has no wheezes. He has no rales. He exhibits no tenderness.  Neurological: He is alert and oriented to person, place, and time.  Skin: Skin is warm and dry. No rash noted.  Psychiatric: Affect normal.  Vitals reviewed.   Recent Results (from the past 2160 hour(s))  Comprehensive metabolic panel     Status: Abnormal   Collection Time: 11/05/16  9:00 AM  Result Value Ref Range   Sodium 137 135 - 145 mEq/L   Potassium 4.6 3.5 - 5.1 mEq/L   Chloride 99 96 - 112 mEq/L   CO2 31 19 - 32 mEq/L   Glucose, Bld 111 (H) 70 - 99 mg/dL   BUN 11 6 - 23 mg/dL   Creatinine, Ser 0.87 0.40 - 1.50 mg/dL   Total Bilirubin 0.8 0.2 - 1.2 mg/dL   Alkaline Phosphatase 61 39 - 117 U/L   AST 15 0 - 37 U/L   ALT 20 0 - 53 U/L   Total  Protein 7.0 6.0 - 8.3 g/dL   Albumin 4.5 3.5 - 5.2 g/dL   Calcium 9.5 8.4 - 10.5 mg/dL   GFR 94.42 >60.00 mL/min  CBC with Differential/Platelet     Status: Abnormal   Collection Time: 11/05/16  9:00 AM  Result Value Ref Range   WBC 6.2 4.0 - 10.5 K/uL   RBC 5.32 4.22 - 5.81 Mil/uL   Hemoglobin 17.5 (H) 13.0 - 17.0 g/dL   HCT 50.7 39.0 - 52.0 %   MCV 95.3 78.0 - 100.0 fl   MCHC 34.6 30.0 - 36.0 g/dL   RDW 13.5 11.5 - 15.5 %   Platelets 136.0 (L) 150.0 - 400.0 K/uL   Neutrophils Relative % 65.6 43.0 - 77.0 %   Lymphocytes Relative 22.9 12.0 - 46.0 %   Monocytes Relative 9.5 3.0 - 12.0 %   Eosinophils Relative 1.1 0.0 - 5.0 %   Basophils Relative 0.9 0.0 - 3.0 %   Neutro Abs 4.1 1.4 - 7.7 K/uL   Lymphs Abs 1.4 0.7 - 4.0 K/uL   Monocytes Absolute 0.6 0.1 - 1.0 K/uL   Eosinophils Absolute 0.1 0.0 - 0.7 K/uL   Basophils Absolute 0.1 0.0 - 0.1 K/uL  Hemoglobin A1c     Status: None   Collection Time: 11/05/16  9:00 AM  Result Value Ref Range   Hgb A1c MFr Bld 6.2 4.6 - 6.5 %    Comment: Glycemic Control Guidelines for People with Diabetes:Non Diabetic:  <6%Goal of Therapy: <7%Additional Action Suggested:  >8%   Lipid panel     Status: Abnormal   Collection Time: 11/05/16  9:00 AM  Result Value Ref Range   Cholesterol 175 0 - 200 mg/dL    Comment: ATP III Classification       Desirable:  < 200 mg/dL               Borderline High:  200 - 239 mg/dL          High:  > = 240 mg/dL   Triglycerides 123.0 0.0 - 149.0 mg/dL    Comment: Normal:  <150 mg/dLBorderline High:  150 - 199 mg/dL   HDL 48.70 >39.00 mg/dL   VLDL 24.6 0.0 - 40.0 mg/dL   LDL Cholesterol 101 (H) 0 -  99 mg/dL   Total CHOL/HDL Ratio 4     Comment:                Men          Women1/2 Average Risk     3.4          3.3Average Risk          5.0          4.42X Average Risk          9.6          7.13X Average Risk          15.0          11.0                       NonHDL 125.80     Comment: NOTE:  Non-HDL goal should be 30  mg/dL higher than patient's LDL goal (i.e. LDL goal of < 70 mg/dL, would have non-HDL goal of < 100 mg/dL)  TSH     Status: None   Collection Time: 11/05/16  9:00 AM  Result Value Ref Range   TSH 1.05 0.35 - 4.50 uIU/mL  Urinalysis, Routine w reflex microscopic     Status: None   Collection Time: 11/05/16  9:00 AM  Result Value Ref Range   Color, Urine YELLOW Yellow;Lt. Yellow   APPearance CLEAR Clear   Specific Gravity, Urine 1.010 1.000 - 1.030   pH 6.0 5.0 - 8.0   Total Protein, Urine NEGATIVE Negative   Urine Glucose NEGATIVE Negative   Ketones, ur NEGATIVE Negative   Bilirubin Urine NEGATIVE Negative   Hgb urine dipstick NEGATIVE Negative   Urobilinogen, UA 0.2 0.0 - 1.0   Leukocytes, UA NEGATIVE Negative   Nitrite NEGATIVE Negative   WBC, UA 0-2/hpf 0-2/hpf   RBC / HPF none seen 0-2/hpf   Squamous Epithelial / LPF Rare(0-4/hpf) Rare(0-4/hpf)  PSA     Status: None   Collection Time: 11/05/16  9:00 AM  Result Value Ref Range   PSA 0.59 0.10 - 4.00 ng/mL    Assessment/Plan: Tobacco abuse disorder Tolerating Chantix well. Down to 1/2 ppd from 2 ppd. Increased energy noted. Continue Chantix. Follow-up scheduled.   Essential hypertension BP still above goal. Will continue smoking cessation effots including Chantix. Discussed diet and exercise. Will d/c lisinopril and start lisinopril-HCTZ 20-12.5 mg. Follow-up scheduled.   Elevated hemoglobin (HCC) Repeat level today. Likely from smoking.     Leeanne Rio, PA-C

## 2016-11-19 NOTE — Patient Instructions (Signed)
Please go to the lab for repeat hemoglobin level today. I will call with your results.  Please continue your chantix as directed. Work on the lifestyle changes we discussed to further reduce smoking.  Start the new lisinopril-HCTZ daily instead of plain lisinopril. Continue diet and exercise. Check home blood pressure measurements a few times per week and records.   Follow-up in 3-4 weeks.

## 2016-11-19 NOTE — Assessment & Plan Note (Signed)
BP still above goal. Will continue smoking cessation effots including Chantix. Discussed diet and exercise. Will d/c lisinopril and start lisinopril-HCTZ 20-12.5 mg. Follow-up scheduled.

## 2016-11-19 NOTE — Progress Notes (Signed)
Pre visit review using our clinic review tool, if applicable. No additional management support is needed unless otherwise documented below in the visit note. 

## 2016-11-29 ENCOUNTER — Telehealth: Payer: Self-pay | Admitting: Physician Assistant

## 2016-11-29 NOTE — Telephone Encounter (Signed)
Called and informed pt, he is going to look for the name of his GI provider with [facility] and give them a call. He will call back if he needs a referral.

## 2016-11-29 NOTE — Telephone Encounter (Signed)
Received records from [facility]. Colonoscopy in 2010 with hyperplastic polyps. Recall was 5 years so he is overdue for colonoscopy. Want patient to call his specialist to set up repeat colonoscopy.

## 2016-12-01 ENCOUNTER — Other Ambulatory Visit: Payer: Self-pay | Admitting: Physician Assistant

## 2016-12-06 ENCOUNTER — Observation Stay (HOSPITAL_BASED_OUTPATIENT_CLINIC_OR_DEPARTMENT_OTHER)
Admission: EM | Admit: 2016-12-06 | Discharge: 2016-12-07 | Disposition: A | Discharge status: Home / Self Care | Payer: Managed Care, Other (non HMO) | Attending: Internal Medicine | Admitting: Internal Medicine

## 2016-12-06 ENCOUNTER — Encounter (HOSPITAL_BASED_OUTPATIENT_CLINIC_OR_DEPARTMENT_OTHER): Payer: Self-pay | Admitting: *Deleted

## 2016-12-06 ENCOUNTER — Emergency Department (HOSPITAL_BASED_OUTPATIENT_CLINIC_OR_DEPARTMENT_OTHER): Payer: Managed Care, Other (non HMO)

## 2016-12-06 DIAGNOSIS — Z9049 Acquired absence of other specified parts of digestive tract: Secondary | ICD-10-CM | POA: Insufficient documentation

## 2016-12-06 DIAGNOSIS — Z808 Family history of malignant neoplasm of other organs or systems: Secondary | ICD-10-CM | POA: Diagnosis not present

## 2016-12-06 DIAGNOSIS — R079 Chest pain, unspecified: Secondary | ICD-10-CM | POA: Insufficient documentation

## 2016-12-06 DIAGNOSIS — R002 Palpitations: Secondary | ICD-10-CM | POA: Diagnosis present

## 2016-12-06 DIAGNOSIS — I119 Hypertensive heart disease without heart failure: Secondary | ICD-10-CM | POA: Diagnosis not present

## 2016-12-06 DIAGNOSIS — Z806 Family history of leukemia: Secondary | ICD-10-CM | POA: Insufficient documentation

## 2016-12-06 DIAGNOSIS — Z818 Family history of other mental and behavioral disorders: Secondary | ICD-10-CM | POA: Diagnosis not present

## 2016-12-06 DIAGNOSIS — R0789 Other chest pain: Secondary | ICD-10-CM

## 2016-12-06 DIAGNOSIS — I2510 Atherosclerotic heart disease of native coronary artery without angina pectoris: Secondary | ICD-10-CM | POA: Diagnosis not present

## 2016-12-06 DIAGNOSIS — Z8249 Family history of ischemic heart disease and other diseases of the circulatory system: Secondary | ICD-10-CM | POA: Diagnosis not present

## 2016-12-06 DIAGNOSIS — E785 Hyperlipidemia, unspecified: Secondary | ICD-10-CM | POA: Diagnosis not present

## 2016-12-06 DIAGNOSIS — Z811 Family history of alcohol abuse and dependence: Secondary | ICD-10-CM | POA: Diagnosis not present

## 2016-12-06 DIAGNOSIS — Z6833 Body mass index (BMI) 33.0-33.9, adult: Secondary | ICD-10-CM | POA: Diagnosis not present

## 2016-12-06 DIAGNOSIS — F17210 Nicotine dependence, cigarettes, uncomplicated: Secondary | ICD-10-CM | POA: Insufficient documentation

## 2016-12-06 DIAGNOSIS — E669 Obesity, unspecified: Secondary | ICD-10-CM | POA: Insufficient documentation

## 2016-12-06 DIAGNOSIS — Z79899 Other long term (current) drug therapy: Secondary | ICD-10-CM | POA: Diagnosis not present

## 2016-12-06 DIAGNOSIS — G4733 Obstructive sleep apnea (adult) (pediatric): Secondary | ICD-10-CM | POA: Insufficient documentation

## 2016-12-06 DIAGNOSIS — Z809 Family history of malignant neoplasm, unspecified: Secondary | ICD-10-CM | POA: Insufficient documentation

## 2016-12-06 DIAGNOSIS — E119 Type 2 diabetes mellitus without complications: Secondary | ICD-10-CM | POA: Insufficient documentation

## 2016-12-06 DIAGNOSIS — R000 Tachycardia, unspecified: Secondary | ICD-10-CM

## 2016-12-06 DIAGNOSIS — J441 Chronic obstructive pulmonary disease with (acute) exacerbation: Secondary | ICD-10-CM

## 2016-12-06 DIAGNOSIS — Z800 Family history of malignant neoplasm of digestive organs: Secondary | ICD-10-CM | POA: Diagnosis not present

## 2016-12-06 HISTORY — DX: Essential (primary) hypertension: I10

## 2016-12-06 HISTORY — DX: Chronic obstructive pulmonary disease with (acute) exacerbation: J44.1

## 2016-12-06 LAB — CBC WITH DIFFERENTIAL/PLATELET
BASOS PCT: 1 %
Basophils Absolute: 0 10*3/uL (ref 0.0–0.1)
EOS ABS: 0.2 10*3/uL (ref 0.0–0.7)
Eosinophils Relative: 2 %
HCT: 50.3 % (ref 39.0–52.0)
Hemoglobin: 17.8 g/dL — ABNORMAL HIGH (ref 13.0–17.0)
Lymphocytes Relative: 29 %
Lymphs Abs: 2.2 10*3/uL (ref 0.7–4.0)
MCH: 32.8 pg (ref 26.0–34.0)
MCHC: 35.4 g/dL (ref 30.0–36.0)
MCV: 92.6 fL (ref 78.0–100.0)
MONO ABS: 0.8 10*3/uL (ref 0.1–1.0)
MONOS PCT: 10 %
Neutro Abs: 4.5 10*3/uL (ref 1.7–7.7)
Neutrophils Relative %: 58 %
PLATELETS: 152 10*3/uL (ref 150–400)
RBC: 5.43 MIL/uL (ref 4.22–5.81)
RDW: 12.6 % (ref 11.5–15.5)
WBC: 7.7 10*3/uL (ref 4.0–10.5)

## 2016-12-06 LAB — D-DIMER, QUANTITATIVE: D-Dimer, Quant: <0.27 ug/mL-FEU (ref 0.00–0.50)

## 2016-12-06 LAB — BASIC METABOLIC PANEL
Anion gap: 11 (ref 5–15)
BUN: 17 mg/dL (ref 6–20)
CALCIUM: 9.2 mg/dL (ref 8.9–10.3)
CO2: 27 mmol/L (ref 22–32)
CREATININE: 0.8 mg/dL (ref 0.61–1.24)
Chloride: 94 mmol/L — ABNORMAL LOW (ref 101–111)
GFR calc non Af Amer: >60 mL/min (ref >60)
Glucose, Bld: 97 mg/dL (ref 65–99)
Potassium: 4 mmol/L (ref 3.5–5.1)
Sodium: 132 mmol/L — ABNORMAL LOW (ref 135–145)

## 2016-12-06 LAB — CBC
HEMATOCRIT: 50 % (ref 39.0–52.0)
Hemoglobin: 17.9 g/dL — ABNORMAL HIGH (ref 13.0–17.0)
MCH: 32.9 pg (ref 26.0–34.0)
MCHC: 35.8 g/dL (ref 30.0–36.0)
MCV: 91.9 fL (ref 78.0–100.0)
Platelets: 163 10*3/uL (ref 150–400)
RBC: 5.44 MIL/uL (ref 4.22–5.81)
RDW: 12.7 % (ref 11.5–15.5)
WBC: 10.3 10*3/uL (ref 4.0–10.5)

## 2016-12-06 LAB — RAPID URINE DRUG SCREEN, HOSP PERFORMED
AMPHETAMINES: NOT DETECTED
Barbiturates: NOT DETECTED
Benzodiazepines: NOT DETECTED
Cocaine: NOT DETECTED
Opiates: NOT DETECTED
Tetrahydrocannabinol: NOT DETECTED

## 2016-12-06 LAB — TROPONIN I: Troponin I: <0.03 ng/mL (ref <0.03)

## 2016-12-06 LAB — CREATININE, SERUM
Creatinine, Ser: 1 mg/dL (ref 0.61–1.24)
GFR calc Af Amer: >60 mL/min (ref >60)

## 2016-12-06 LAB — BRAIN NATRIURETIC PEPTIDE: B NATRIURETIC PEPTIDE 5: 21.9 pg/mL (ref 0.0–100.0)

## 2016-12-06 MED ORDER — ALBUTEROL SULFATE (2.5 MG/3ML) 0.083% IN NEBU: 2.5000 mg | INHALATION_SOLUTION | RESPIRATORY_TRACT | Status: DC | PRN

## 2016-12-06 MED ORDER — ENOXAPARIN SODIUM 40 MG/0.4ML ~~LOC~~ SOLN
40.0000 mg | SUBCUTANEOUS | Status: DC
  Administered 2016-12-06: 40 mg via SUBCUTANEOUS
  Filled 2016-12-06: qty 0.4

## 2016-12-06 MED ORDER — ASPIRIN 81 MG PO CHEW
324.0000 mg | CHEWABLE_TABLET | Freq: Once | ORAL | Status: AC
  Administered 2016-12-06: 324 mg via ORAL
  Filled 2016-12-06: qty 4

## 2016-12-06 MED ORDER — ONDANSETRON HCL 4 MG/2ML IJ SOLN
4.0000 mg | Freq: Once | INTRAMUSCULAR | Status: AC
  Administered 2016-12-06: 4 mg via INTRAVENOUS

## 2016-12-06 MED ORDER — IPRATROPIUM-ALBUTEROL 0.5-2.5 (3) MG/3ML IN SOLN: 3.0000 mL | Freq: Two times a day (BID) | RESPIRATORY_TRACT | Status: DC

## 2016-12-06 MED ORDER — SODIUM CHLORIDE 0.9% FLUSH
3.0000 mL | Freq: Two times a day (BID) | INTRAVENOUS | Status: DC
  Administered 2016-12-06: 3 mL via INTRAVENOUS

## 2016-12-06 MED ORDER — FUROSEMIDE 20 MG PO TABS
20.0000 mg | ORAL_TABLET | Freq: Once | ORAL | Status: AC
  Administered 2016-12-06: 20 mg via ORAL
  Filled 2016-12-06: qty 1

## 2016-12-06 MED ORDER — IPRATROPIUM-ALBUTEROL 0.5-2.5 (3) MG/3ML IN SOLN
3.0000 mL | Freq: Four times a day (QID) | RESPIRATORY_TRACT | Status: DC
Start: 2016-12-06 — End: 2016-12-06

## 2016-12-06 MED ORDER — AZITHROMYCIN 250 MG PO TABS
250.0000 mg | ORAL_TABLET | Freq: Every day | ORAL | Status: DC
  Administered 2016-12-07: 250 mg via ORAL
  Filled 2016-12-06: qty 1

## 2016-12-06 MED ORDER — ONDANSETRON HCL 4 MG/2ML IJ SOLN
INTRAMUSCULAR | Status: AC
  Filled 2016-12-06: qty 2

## 2016-12-06 MED ORDER — SODIUM CHLORIDE 0.9% FLUSH: 3.0000 mL | INTRAVENOUS | Status: DC | PRN

## 2016-12-06 MED ORDER — LISINOPRIL-HYDROCHLOROTHIAZIDE 20-12.5 MG PO TABS: 1.0000 | ORAL_TABLET | Freq: Every day | ORAL | Status: DC

## 2016-12-06 MED ORDER — GI COCKTAIL ~~LOC~~
30.0000 mL | Freq: Once | ORAL | Status: AC
Start: 2016-12-06 — End: 2016-12-06
  Administered 2016-12-06: 30 mL via ORAL
  Filled 2016-12-06: qty 30

## 2016-12-06 MED ORDER — SODIUM CHLORIDE 0.9 % IV SOLN: 250.0000 mL | INTRAVENOUS | Status: DC | PRN

## 2016-12-06 MED ORDER — ALBUTEROL SULFATE (2.5 MG/3ML) 0.083% IN NEBU: 2.5000 mg | INHALATION_SOLUTION | Freq: Once | RESPIRATORY_TRACT | Status: DC

## 2016-12-06 MED ORDER — PREDNISONE 20 MG PO TABS
40.0000 mg | ORAL_TABLET | Freq: Every day | ORAL | Status: DC
  Administered 2016-12-07: 40 mg via ORAL
  Filled 2016-12-06: qty 2

## 2016-12-06 MED ORDER — HYDROCHLOROTHIAZIDE 12.5 MG PO CAPS
12.5000 mg | ORAL_CAPSULE | Freq: Every day | ORAL | Status: DC
  Administered 2016-12-06 – 2016-12-07 (×2): 12.5 mg via ORAL
  Filled 2016-12-06 (×2): qty 1

## 2016-12-06 MED ORDER — METHYLPREDNISOLONE SODIUM SUCC 125 MG IJ SOLR
125.0000 mg | Freq: Once | INTRAMUSCULAR | Status: AC
  Administered 2016-12-06: 125 mg via INTRAVENOUS
  Filled 2016-12-06: qty 2

## 2016-12-06 MED ORDER — DEXTROSE 5 % IV SOLN
1.0000 g | Freq: Once | INTRAVENOUS | Status: AC
  Administered 2016-12-06: 1 g via INTRAVENOUS
  Filled 2016-12-06: qty 10

## 2016-12-06 MED ORDER — ONDANSETRON HCL 4 MG/2ML IJ SOLN: 4.0000 mg | Freq: Four times a day (QID) | INTRAMUSCULAR | Status: DC | PRN

## 2016-12-06 MED ORDER — IPRATROPIUM-ALBUTEROL 0.5-2.5 (3) MG/3ML IN SOLN
3.0000 mL | Freq: Four times a day (QID) | RESPIRATORY_TRACT | Status: DC
  Administered 2016-12-06 (×2): 3 mL via RESPIRATORY_TRACT
  Filled 2016-12-06: qty 3

## 2016-12-06 MED ORDER — AZITHROMYCIN 250 MG PO TABS
500.0000 mg | ORAL_TABLET | Freq: Once | ORAL | Status: AC
  Administered 2016-12-06: 500 mg via ORAL
  Filled 2016-12-06: qty 2

## 2016-12-06 MED ORDER — IPRATROPIUM-ALBUTEROL 0.5-2.5 (3) MG/3ML IN SOLN
3.0000 mL | Freq: Once | RESPIRATORY_TRACT | Status: AC
  Administered 2016-12-06: 3 mL via RESPIRATORY_TRACT
  Filled 2016-12-06: qty 3

## 2016-12-06 MED ORDER — IPRATROPIUM BROMIDE 0.02 % IN SOLN: 0.5000 mg | Freq: Once | RESPIRATORY_TRACT | Status: DC

## 2016-12-06 MED ORDER — LISINOPRIL 20 MG PO TABS
20.0000 mg | ORAL_TABLET | Freq: Every day | ORAL | Status: DC
  Administered 2016-12-06 – 2016-12-07 (×2): 20 mg via ORAL
  Filled 2016-12-06 (×2): qty 1

## 2016-12-06 MED ORDER — ALBUTEROL SULFATE (2.5 MG/3ML) 0.083% IN NEBU
5.0000 mg | INHALATION_SOLUTION | Freq: Once | RESPIRATORY_TRACT | Status: AC
  Administered 2016-12-06: 5 mg via RESPIRATORY_TRACT

## 2016-12-06 NOTE — ED Provider Notes (Addendum)
[facility] Provider Note   CSN: [id number] Arrival date & time: 12/06/16  0308     History   Chief Complaint Chief Complaint  Patient presents with  . Chest Pain  . Tachycardia    HPI Scott Jensen is a 61 y.o. male.  The history is provided by the patient.  Chest Pain   This is a recurrent problem. The current episode started 2 days ago. The problem occurs rarely. The problem has not changed since onset.The pain is associated with rest. The pain is present in the substernal region. The pain is moderate. The quality of the pain is described as dull. The pain does not radiate. Associated symptoms include palpitations. Pertinent negatives include no abdominal pain, no back pain, no diaphoresis, no exertional chest pressure, no lower extremity edema, no nausea, no near-syncope, no PND, no shortness of breath, no sputum production, no syncope and no vomiting. He has tried nothing for the symptoms. The treatment provided no relief. Risk factors include male gender and obesity.  His past medical history is significant for COPD and hypertension.  Pertinent negatives for past medical history include no aneurysm, no Marfan's syndrome, no MI and no PE.  Pertinent negatives for family medical history include: no CAD.  Procedure history is negative for cardiac catheterization and EPS study.  Started Chantix on the 6th of August and has had 2 evenings of chest pain.  No SOB, no exertional CP, no DOE, no n/v/d. He does states he has had rapid palpitations with HR 170s that resolved within 15 minutes with both episodes and given the first episode on 12/04/16 stopped taking the Chantix.    Past Medical History:  Diagnosis Date  . Cervical adenopathy    Steel plate C5-C6  . Environmental allergies   . History of chicken pox   . Hyperglycemia    Borderline Daibetes  . Hypertension   . Increased pressure in the eye   . Measles   . Mumps   . Seasonal allergies     Patient Active  Problem List   Diagnosis Date Noted  . Elevated hemoglobin ([place]) 11/19/2016  . Hyperlipidemia 11/05/2016  . Prostate cancer screening 11/05/2016  . Wheezing 11/05/2016  . Essential hypertension 06/07/2015  . Cardiomegaly 06/07/2015  . Lipoma of arm 05/31/2015  . Colon cancer screening 05/31/2015  . Visit for preventive health examination 05/22/2014  . Tobacco abuse disorder 05/22/2014    Past Surgical History:  Procedure Laterality Date  . Cervical HaArdware Placement    . CHOLECYSTECTOMY    . COLONOSCOPY     Q5 yrs  . TONSILLECTOMY    . WISDOM TOOTH EXTRACTION         Home Medications    Prior to Admission medications   Medication Sig Start Date End Date Taking? Authorizing Provider  albuterol (PROVENTIL HFA;VENTOLIN HFA) 108 (90 Base) MCG/ACT inhaler Inhale 2 puffs into the lungs every 6 (six) hours as needed for wheezing or shortness of breath. 11/19/16   Brunetta Jeans, PA-C  CHANTIX STARTING MONTH PAK 0.5 MG X 11 & 1 MG X 42 tablet TAKE AS DIRECTED ON PACKAGE 12/02/16   Brunetta Jeans, PA-C  Ibuprofen 200 MG CAPS Take by mouth as needed.    [provider]  lisinopril-hydrochlorothiazide (ZESTORETIC) 20-12.5 MG tablet Take 1 tablet by mouth daily. 11/19/16   Brunetta Jeans, PA-C    Family History Family History  Problem Relation Age of Onset  . Brain cancer Mother 75  Deceased  . Liver cancer Mother        Mets  . Anxiety disorder Mother   . Colon cancer Father 60       Deceased  . Alcoholism Father   . Cancer Other        Aunts & Uncles  . Leukemia Sister   . Leukemia Cousin   . Hypertension Brother   . Mental illness Brother     Social History Social History  Substance Use Topics  . Smoking status: Current Every Day Smoker    Years: 3.00    Types: Cigarettes  . Smokeless tobacco: Never Used  . Alcohol use 3.0 oz/week    5 Standard drinks or equivalent per week     Comment: beer only     Allergies   Patient has no known  allergies.   Review of Systems Review of Systems  Constitutional: Negative for diaphoresis.  Respiratory: Positive for wheezing. Negative for sputum production, choking, chest tightness and shortness of breath.   Cardiovascular: Positive for chest pain and palpitations. Negative for leg swelling, syncope, PND and near-syncope.  Gastrointestinal: Negative for abdominal pain, nausea and vomiting.  Musculoskeletal: Negative for arthralgias, back pain and neck pain.  All other systems reviewed and are negative.    Physical Exam Updated Vital Signs BP (!) 152/89   Pulse 92   Temp 98 F (36.7 C) (Oral)   Resp 18   Ht 5\' 8"  (1.727 m)   Wt 99.8 kg (220 lb)   SpO2 95%   BMI 33.45 kg/m   Physical Exam  Constitutional: He is oriented to person, place, and time. He appears well-developed and well-nourished. No distress.  HENT:  Head: Normocephalic and atraumatic.  Mouth/Throat: No oropharyngeal exudate.  Eyes: Pupils are equal, round, and reactive to light. Conjunctivae are normal.  Neck: Normal range of motion. Neck supple. No JVD present.  Cardiovascular: Normal rate, regular rhythm, normal heart sounds and intact distal pulses.   Pulmonary/Chest: No stridor. He has decreased breath sounds. He has wheezes. He has no rales.  Abdominal: Soft. Bowel sounds are normal. He exhibits no mass. There is no tenderness. There is no rebound and no guarding.  Musculoskeletal: Normal range of motion. He exhibits no edema or tenderness.  Neurological: He is alert and oriented to person, place, and time. He displays normal reflexes.  Skin: Skin is warm and dry. Capillary refill takes less than 2 seconds. He is not diaphoretic.  Psychiatric: He has a normal mood and affect.     ED Treatments / Results   Vitals:   12/06/16 0530 12/06/16 0600  BP: 137/67 120/66  Pulse: 83 79  Resp: 12 14  Temp:    SpO2: 91% 93%    Labs (all labs ordered are listed, but only abnormal results are  displayed)  Results for orders placed or performed during the hospital encounter of 12/06/16  CBC with Differential/Platelet  Result Value Ref Range   WBC 7.7 4.0 - 10.5 K/uL   RBC 5.43 4.22 - 5.81 MIL/uL   Hemoglobin 17.8 (H) 13.0 - 17.0 g/dL   HCT 50.3 39.0 - 52.0 %   MCV 92.6 78.0 - 100.0 fL   MCH 32.8 26.0 - 34.0 pg   MCHC 35.4 30.0 - 36.0 g/dL   RDW 12.6 11.5 - 15.5 %   Platelets 152 150 - 400 K/uL   Neutrophils Relative % 58 %   Neutro Abs 4.5 1.7 - 7.7 K/uL   Lymphocytes Relative 29 %  Lymphs Abs 2.2 0.7 - 4.0 K/uL   Monocytes Relative 10 %   Monocytes Absolute 0.8 0.1 - 1.0 K/uL   Eosinophils Relative 2 %   Eosinophils Absolute 0.2 0.0 - 0.7 K/uL   Basophils Relative 1 %   Basophils Absolute 0.0 0.0 - 0.1 K/uL  Basic metabolic panel  Result Value Ref Range   Sodium 132 (L) 135 - 145 mmol/L   Potassium 4.0 3.5 - 5.1 mmol/L   Chloride 94 (L) 101 - 111 mmol/L   CO2 27 22 - 32 mmol/L   Glucose, Bld 97 65 - 99 mg/dL   BUN 17 6 - 20 mg/dL   Creatinine, Ser 0.80 0.61 - 1.24 mg/dL   Calcium 9.2 8.9 - 10.3 mg/dL   GFR calc non Af Amer >60 >60 mL/min   GFR calc Af Amer >60 >60 mL/min   Anion gap 11 5 - 15  Troponin I  Result Value Ref Range   Troponin I <0.03 <0.03 ng/mL  Rapid urine drug screen (hospital performed)  Result Value Ref Range   Opiates NONE DETECTED NONE DETECTED   Cocaine NONE DETECTED NONE DETECTED   Benzodiazepines NONE DETECTED NONE DETECTED   Amphetamines NONE DETECTED NONE DETECTED   Tetrahydrocannabinol NONE DETECTED NONE DETECTED   Barbiturates NONE DETECTED NONE DETECTED   Dg Chest Portable 1 View  Result Date: 12/06/2016 CLINICAL DATA:  Acute onset of left upper anterior chest pain and cough. Tachycardia. Initial encounter. EXAM: PORTABLE CHEST 1 VIEW COMPARISON:  Chest radiograph and CTA of the chest performed 04/22/2016 FINDINGS: The lungs are well-aerated. Mild vascular congestion is noted. There is no evidence of focal opacification,  pleural effusion or pneumothorax. The cardiomediastinal silhouette is within normal limits. No acute osseous abnormalities are seen. IMPRESSION: Mild vascular congestion noted.  Lungs remain grossly clear. Electronically Signed   By: Garald Balding M.D.   On: 12/06/2016 03:55    EKG  EKG Interpretation  Date/Time:  Friday December 06 2016 03:14:56 EDT Ventricular Rate:  95 PR Interval:    QRS Duration: 105 QT Interval:  354 QTC Calculation: 445 R Axis:   62 Text Interpretation:  Sinus rhythm Probable left atrial enlargement Left ventricular hypertrophy unchanged from previous Confirmed by Randal Buba, Lucrezia Dehne ([id number]) on 12/06/2016 4:02:07 AM       Radiology Dg Chest Portable 1 View  Result Date: 12/06/2016 CLINICAL DATA:  Acute onset of left upper anterior chest pain and cough. Tachycardia. Initial encounter. EXAM: PORTABLE CHEST 1 VIEW COMPARISON:  Chest radiograph and CTA of the chest performed 04/22/2016 FINDINGS: The lungs are well-aerated. Mild vascular congestion is noted. There is no evidence of focal opacification, pleural effusion or pneumothorax. The cardiomediastinal silhouette is within normal limits. No acute osseous abnormalities are seen. IMPRESSION: Mild vascular congestion noted.  Lungs remain grossly clear. Electronically Signed   By: Garald Balding M.D.   On: 12/06/2016 03:55    Procedures Procedures (including critical care time)  Medications Ordered in ED Medications  ondansetron (ZOFRAN) 4 MG/2ML injection (not administered)  cefTRIAXone (ROCEPHIN) 1 g in dextrose 5 % 50 mL IVPB (not administered)  aspirin chewable tablet 324 mg (not administered)  gi cocktail (Maalox,Lidocaine,Donnatal) (30 mLs Oral Given 12/06/16 0332)  ipratropium-albuterol (DUONEB) 0.5-2.5 (3) MG/3ML nebulizer solution 3 mL (3 mLs Nebulization Given 12/06/16 0407)  albuterol (PROVENTIL) (2.5 MG/3ML) 0.083% nebulizer solution 5 mg (5 mg Nebulization Given 12/06/16 0454)  methylPREDNISolone sodium  succinate (SOLU-MEDROL) 125 mg/2 mL injection 125 mg (125 mg Intravenous Given  12/06/16 0506)  ondansetron (ZOFRAN) injection 4 mg (4 mg Intravenous Given 12/06/16 0506)    Patient began vomiting in the ED and zofran was administered  While patient's O2 saturation improved on albuterol nebs they immediately declined.    Final Clinical Impressions(s) / ED Diagnoses  COPD exacerbation with hypoxia and palpitations/chest pain:   I have seen no ectopy nor tachycardia on the monitor save during albuterol treatment. However, given the 2 described episodes at home.  I believe this patient needs a full cardiac work up given his HEART score of 4 and known coronary artery calcifications on CT scan in December.  Moreover, this patient will need albuterol therapy and steroids and medical stabilization from the standpoint of his COPD prior to discharge as he cannot be safely discharged to home with O2 saturations of 90 and below.           Curley Fayette, MD 12/06/16 [phone]

## 2016-12-06 NOTE — H&P (Signed)
History and Physical:    Scott Jensen   ZOX:[id number] DOB: [birth year] DOA: 12/06/2016  Referring MD/provider: Dr Randal Buba PCP: Brunetta Jeans, PA-C   Patient coming from: home  Chief Complaint: Substernal chest pressure waking him up from sleep 2 nights.  History of Present Illness:   Scott Jensen is an 61 y.o. male with past medical history significant for hypertension, hyperlipidemia and COPD who was in his usual state of health until 2 nights prior to admission when he was suddenly woken up from sleep with a sensation of a stretching pain in his substernal area. Patient denied shortness of breath or palpitations during that time however he did check his pulse and O2 sat urination with his own pulse oximeter at his bedside. At that time he noted that his heart rate was 160 with normal O2 saturations. Patient said he tried to calm himself down to practice deep breathing and after about 20 minutes his heart rate dropped down to 100 and then subsequently down to 80. Patient noted there was no fluctuation in his heart rate while he was running 160. Patient denied any dizziness at that time. Patient subsequently went back to sleep and went to work the following day without difficulty. Last night however patient had another episode of being woken up with same sensations of a stretching aching feeling in his substernal area. Once again his heart rate was noted to be 160 with his pulse ox. Patient presented to the ED however heart rate had normalized by the time he arrived.  On arrival in the ED, patient was noted to be somewhat hypoxic and short of breath. Patient states he didn't really feel short of breath until he got to the ED. He admits to a diagnosis of probable COPD with rare use of albuterol inhaler 1-2 times a month.   ED Course:  In the ED patient was treated with IV steroids and inhaled bronchodilators with improvement in his O2 saturations and an shortness of breath. He has had  no further recurrence of tachycardia but is transferred to [place] for further workup and evaluation of tachycardia.  ROS:   ROS: No fevers or chills. No malaise. No cough. No hemoptysis. No pleuritic chest pain. No exertional dyspnea or exertional chest pain. No nausea or vomiting no abdominal pain.  Past Medical History:   Past Medical History:  Diagnosis Date  . Cervical adenopathy    Steel plate C5-C6  . Environmental allergies   . History of chicken pox   . Hyperglycemia    Borderline Daibetes  . Hypertension   . Increased pressure in the eye   . Measles   . Mumps   . Seasonal allergies     Past Surgical History:   Past Surgical History:  Procedure Laterality Date  . Cervical HaArdware Placement    . CHOLECYSTECTOMY    . COLONOSCOPY     Q5 yrs  . TONSILLECTOMY    . WISDOM TOOTH EXTRACTION      Social History:   Social History   Social History  . Marital status: Legally Separated    Spouse name: N/A  . Number of children: N/A  . Years of education: N/A   Occupational History  . Not on file.   Social History Main Topics  . Smoking status: Current Every Day Smoker    Years: 3.00    Types: Cigarettes  . Smokeless tobacco: Never Used  . Alcohol use 3.0 oz/week    5 Standard drinks  or equivalent per week     Comment: beer only  . Drug use: No  . Sexual activity: Yes    Partners: Female   Other Topics Concern  . Not on file   Social History Narrative  . No narrative on file    Allergies   Patient has no known allergies.  Family history:   Family History  Problem Relation Age of Onset  . Brain cancer Mother 78       Deceased  . Liver cancer Mother        Mets  . Anxiety disorder Mother   . Colon cancer Father 49       Deceased  . Alcoholism Father   . Cancer Other        Aunts & Uncles  . Leukemia Sister   . Leukemia Cousin   . Hypertension Brother   . Mental illness Brother     Current Medications:   Prior to Admission medications    Medication Sig Start Date End Date Taking? Authorizing Provider  albuterol (PROVENTIL HFA;VENTOLIN HFA) 108 (90 Base) MCG/ACT inhaler Inhale 2 puffs into the lungs every 6 (six) hours as needed for wheezing or shortness of breath. 11/19/16  Yes Brunetta Jeans, PA-C  Ibuprofen 200 MG CAPS Take 600 mg by mouth every 6 (six) hours as needed (pain).    Yes [provider]  lisinopril-hydrochlorothiazide (ZESTORETIC) 20-12.5 MG tablet Take 1 tablet by mouth daily. 11/19/16  Yes Brunetta Jeans, PA-C  CHANTIX STARTING MONTH PAK 0.5 MG X 11 & 1 MG X 42 tablet TAKE AS DIRECTED ON PACKAGE Patient not taking: Reported on 12/06/2016 12/02/16   Brunetta Jeans, PA-C    Physical Exam:   Vitals:   12/06/16 1308 12/06/16 1453 12/06/16 1658 12/06/16 1701  BP:  (!) 144/73    Pulse:  84    Resp:  18    Temp: 98.4 F (36.9 C) 98.1 F (36.7 C)    TempSrc: Oral Oral    SpO2:  97% 91% 91%  Weight:      Height:         Physical Exam: Blood pressure (!) 144/73, pulse 84, temperature 98.1 F (36.7 C), temperature source Oral, resp. rate 18, height 5\' 8"  (1.727 m), weight 99.8 kg (220 lb), SpO2 91 %. Gen: Tired-appearing gentleman sitting up straight in bed in no apparent distress. He was able to speak in full sentences without difficulty. Head: Normocephalic, atraumatic. Eyes: Sclera anicteric. Conjunctiva not injected. Neck: no jugular venous distention. Chest: Moderately poor air entry bilaterally with scattered high-pitched wheezes diffusely. No rales or rhonchi. CV: Heart sounds are regular with an S1, S2. No murmurs, rubs, clicks, or gallops. Abdomen: Soft, nontender, nondistended with normal active bowel sounds.  Extremities: Extremities are without clubbing, or cyanosis. No edema. Skin: Warm and dry. No rashes, lesions or wounds. Neuro: Alert and oriented times 3; grossly nonfocal.  Psych: Insight is good and judgment is appropriate. Mood and affect normal.   Data Review:     Labs: Basic Metabolic Panel:  Recent Labs Lab 12/06/16 0325 12/06/16 1646  NA 132*  --   K 4.0  --   CL 94*  --   CO2 27  --   GLUCOSE 97  --   BUN 17  --   CREATININE 0.80 1.00  CALCIUM 9.2  --    Liver Function Tests: No results for input(s): AST, ALT, ALKPHOS, BILITOT, PROT, ALBUMIN in the last 168 hours.  No results for input(s): LIPASE, AMYLASE in the last 168 hours. No results for input(s): AMMONIA in the last 168 hours. CBC:  Recent Labs Lab 12/06/16 0325 12/06/16 1646  WBC 7.7 10.3  NEUTROABS 4.5  --   HGB 17.8* 17.9*  HCT 50.3 50.0  MCV 92.6 91.9  PLT 152 163   Cardiac Enzymes:  Recent Labs Lab 12/06/16 0325 12/06/16 0635 12/06/16 1225  TROPONINI <0.03 <0.03 <0.03    BNP (last 3 results) No results for input(s): PROBNP in the last 8760 hours. CBG: No results for input(s): GLUCAP in the last 168 hours.  Urinalysis    Component Value Date/Time   COLORURINE YELLOW 11/05/2016 0900   APPEARANCEUR CLEAR 11/05/2016 0900   LABSPEC 1.010 11/05/2016 0900   PHURINE 6.0 11/05/2016 0900   GLUCOSEU NEGATIVE 11/05/2016 0900   HGBUR NEGATIVE 11/05/2016 0900   BILIRUBINUR NEGATIVE 11/05/2016 0900   KETONESUR NEGATIVE 11/05/2016 0900   UROBILINOGEN 0.2 11/05/2016 0900   NITRITE NEGATIVE 11/05/2016 0900   LEUKOCYTESUR NEGATIVE 11/05/2016 0900      Radiographic Studies: Dg Chest Portable 1 View  Result Date: 12/06/2016 CLINICAL DATA:  Acute onset of left upper anterior chest pain and cough. Tachycardia. Initial encounter. EXAM: PORTABLE CHEST 1 VIEW COMPARISON:  Chest radiograph and CTA of the chest performed 04/22/2016 FINDINGS: The lungs are well-aerated. Mild vascular congestion is noted. There is no evidence of focal opacification, pleural effusion or pneumothorax. The cardiomediastinal silhouette is within normal limits. No acute osseous abnormalities are seen. IMPRESSION: Mild vascular congestion noted.  Lungs remain grossly clear. Electronically  Signed   By: Garald Balding M.D.   On: 12/06/2016 03:55    EKG: Independently reviewed. Normal sinus rhythm normal intervals normal axis increased voltage possible LVH.   Assessment/Plan:   Active Problems:   Essential hypertension   Hyperlipidemia   COPD with acute exacerbation [facility])  CARDIAC Patient with very concerning story of waking up in the middle of the night from tachycardia.  He's been ruled out for MI via cardiac enzymes and EKG.  Cardiology consultation has been called for further workup.  Patient is on telemetry.  PULMONARY Patient with presumed history of COPD presented with shortness of breath and hypoxia and wheezes on exam.  Given association with tachycardia I will check a d-dimer to rule out concern for PE.  Also chest x-ray shows some mild vascular congestion which is certainly possible if he indeed did have tachycardia.  Previous echo does show some grade 1 diastolic dysfunction.  We'll treat with Lasix 20 mg by mouth 1 now. I will also continue treatment for COPD with prednisone 40 mg by mouth daily, azithromycin and inhaled bronchodilators.  Other information:   DVT prophylaxis: Lovenox ordered. Code Status: Full code. Family Communication: Patient friend was at bedside throughout encounter. Disposition Plan: Home Consults called: Cardiology Admission status: Inpatient  The medical decision making on this patient was of high complexity and the patient is at high risk for clinical deterioration, therefore this is a level 3 visit.   Dewaine Oats Derek Jack [facility] Pager [phone] Cell: [phone]   If 7PM-7AM, please contact night-coverage [email or web address] Password [facility] 12/06/2016, 5:34 PM

## 2016-12-06 NOTE — ED Notes (Signed)
Waiting for bed assignment  

## 2016-12-06 NOTE — ED Triage Notes (Signed)
Pt states that he woke this am with chest pain and rapid heart rate states that his pulse was 170 PTA denies N/V diaphoresis or SHOB

## 2016-12-06 NOTE — ED Notes (Signed)
Pt c/o lateral chest pain that woke him from sleep tonight.  He had a similar episode last night.  Pt states that when he woke up, he checked his pulse and BP with his home monitor and he states his heart rate was 170 but came down without any interventions.  Pt has a noticeable twitch in his fingers and EDP suspects that this interfered with an accurate pulse reading at home.  It is apparent from patient's movements that his pain is exacerbated with position change and movement.  Pt denies SOB, but has audible wheeze in upper lobes.  Pt has a hx of asthma, but has not used his inhaler in a while.  He is currently trying to quit smoking and is taking Chantix.

## 2016-12-06 NOTE — ED Notes (Signed)
Patient denies pain and is resting comfortably.  

## 2016-12-06 NOTE — ED Notes (Signed)
Pt was going to sign out AMA but when nurse went into room to administer solumedrol, pt began vomiting and getting SOB.  He has agreed to stay and not leave AMA based on his worsened symptoms.

## 2016-12-06 NOTE — ED Notes (Signed)
Placed patient on 1L nasal cannula because he started to drop his O2 saturations to 88%.

## 2016-12-07 ENCOUNTER — Observation Stay (HOSPITAL_COMMUNITY): Payer: Managed Care, Other (non HMO)

## 2016-12-07 ENCOUNTER — Encounter (HOSPITAL_COMMUNITY): Payer: Self-pay | Admitting: Cardiology

## 2016-12-07 DIAGNOSIS — J441 Chronic obstructive pulmonary disease with (acute) exacerbation: Secondary | ICD-10-CM

## 2016-12-07 DIAGNOSIS — G4733 Obstructive sleep apnea (adult) (pediatric): Secondary | ICD-10-CM

## 2016-12-07 DIAGNOSIS — I119 Hypertensive heart disease without heart failure: Secondary | ICD-10-CM | POA: Diagnosis not present

## 2016-12-07 DIAGNOSIS — I2510 Atherosclerotic heart disease of native coronary artery without angina pectoris: Secondary | ICD-10-CM

## 2016-12-07 DIAGNOSIS — R000 Tachycardia, unspecified: Secondary | ICD-10-CM

## 2016-12-07 DIAGNOSIS — R079 Chest pain, unspecified: Secondary | ICD-10-CM | POA: Diagnosis not present

## 2016-12-07 DIAGNOSIS — E785 Hyperlipidemia, unspecified: Secondary | ICD-10-CM | POA: Diagnosis not present

## 2016-12-07 HISTORY — DX: Atherosclerotic heart disease of native coronary artery without angina pectoris: I25.10

## 2016-12-07 HISTORY — DX: Obstructive sleep apnea (adult) (pediatric): G47.33

## 2016-12-07 HISTORY — DX: Chest pain, unspecified: R07.9

## 2016-12-07 HISTORY — DX: Tachycardia, unspecified: R00.0

## 2016-12-07 LAB — CBC
HEMATOCRIT: 47.8 % (ref 39.0–52.0)
Hemoglobin: 16.4 g/dL (ref 13.0–17.0)
MCH: 32.1 pg (ref 26.0–34.0)
MCHC: 34.3 g/dL (ref 30.0–36.0)
MCV: 93.5 fL (ref 78.0–100.0)
PLATELETS: 160 10*3/uL (ref 150–400)
RBC: 5.11 MIL/uL (ref 4.22–5.81)
RDW: 12.9 % (ref 11.5–15.5)
WBC: 12.1 10*3/uL — AB (ref 4.0–10.5)

## 2016-12-07 LAB — BASIC METABOLIC PANEL
Anion gap: 10 (ref 5–15)
BUN: 17 mg/dL (ref 6–20)
CHLORIDE: 94 mmol/L — AB (ref 101–111)
CO2: 30 mmol/L (ref 22–32)
CREATININE: 0.84 mg/dL (ref 0.61–1.24)
Calcium: 9.1 mg/dL (ref 8.9–10.3)
GFR calc Af Amer: >60 mL/min (ref >60)
GLUCOSE: 148 mg/dL — AB (ref 65–99)
Potassium: 3.8 mmol/L (ref 3.5–5.1)
SODIUM: 134 mmol/L — AB (ref 135–145)

## 2016-12-07 LAB — GLUCOSE, CAPILLARY: Glucose-Capillary: 140 mg/dL — ABNORMAL HIGH (ref 65–99)

## 2016-12-07 LAB — ECHOCARDIOGRAM COMPLETE
HEIGHTINCHES: 68 in
Weight: 3520 oz

## 2016-12-07 LAB — HIV ANTIBODY (ROUTINE TESTING W REFLEX): HIV SCREEN 4TH GENERATION: NONREACTIVE

## 2016-12-07 MED ORDER — AZITHROMYCIN 250 MG PO TABS: 250.0000 mg | ORAL_TABLET | Freq: Every day | ORAL | 0 refills | Status: AC

## 2016-12-07 MED ORDER — DILTIAZEM HCL ER COATED BEADS 180 MG PO CP24: 180.0000 mg | ORAL_CAPSULE | Freq: Every day | ORAL | 1 refills | Status: DC

## 2016-12-07 MED ORDER — IPRATROPIUM-ALBUTEROL 0.5-2.5 (3) MG/3ML IN SOLN
3.0000 mL | Freq: Three times a day (TID) | RESPIRATORY_TRACT | Status: DC
  Administered 2016-12-07: 3 mL via RESPIRATORY_TRACT
  Filled 2016-12-07: qty 3

## 2016-12-07 MED ORDER — TIOTROPIUM BROMIDE MONOHYDRATE 18 MCG IN CAPS: 18.0000 ug | ORAL_CAPSULE | Freq: Every day | RESPIRATORY_TRACT | 2 refills | Status: DC

## 2016-12-07 MED ORDER — DILTIAZEM HCL ER COATED BEADS 180 MG PO CP24
180.0000 mg | ORAL_CAPSULE | Freq: Every day | ORAL | Status: DC
  Administered 2016-12-07: 180 mg via ORAL
  Filled 2016-12-07: qty 1

## 2016-12-07 MED ORDER — PREDNISONE 10 MG PO TABS: 40.0000 mg | ORAL_TABLET | Freq: Every day | ORAL | 0 refills | Status: DC

## 2016-12-07 NOTE — Progress Notes (Signed)
  Echocardiogram 2D Echocardiogram has been performed.  Tildon Silveria L Androw 12/07/2016, 12:30 PM

## 2016-12-07 NOTE — Consult Note (Signed)
Cardiology Consult Note  Admit date: 12/06/2016 Name: Scott Jensen 61 y.o.  male DOB:  08/22/1955 MRN:  [id number]  Today's date:  12/07/2016  Referring Physician:    [facility]  Primary Physician:   Raiford Noble PA  Reason for Consultation:   Arrhythmia, chest pain  IMPRESSIONS: 1.  Intermittent nocturnal palpitations with no recurrence since admission that may be responsible for symptoms-could be intermittent atrial fibrillation or SVT 2.  Chest discomfort that may be related to arrhythmia 3.  Hypertensive heart disease with mild LVH 4.  Diet-controlled diabetes 5.  Hyperlipidemia with previous statin intolerance 6.  CAD as manifested by coronary calcifications on CT scan 7.  COPD with exacerbation on admission 8.  Obesity 9.  Significant psychosocial stress  RECOMMENDATION: 1.  Obtain echocardiogram if no wall motion abnormalities I think he can go home 2.  Return to my office on Monday for an outpatient event monitor 3.  Obtain outpatient stress test 4.  Important to continue to discontinue smoking 5. Diltiazem sustained release 180 mg to suppress any potential arrhythmia 6.  He should avoid using ibuprofen or NSAIDs because this may exacerbate hypertension.  HISTORY: This 61 year old male has a prior history of hypertension.  He has had significant psychosocial stress over the past year and that his wife left him and he is currently living alone.  He had an emergency room visit in December due to stress with severe hypertension and at that time had a CT angiogram that showed some coronary calcifications of his 3 coronaries.  He is normally active and does not have any exertional chest discomfort.  He has a prior diagnosis of sleep apnea many years ago because of the Sleep study test but stated that he quit having headaches and no longer wears any C Pap.  He has not had a follow-up sleep study.  He was diagnosed with elevated lipids a few years ago as well as diabetes  but stated that statins made him ache.  He had tried stop smoking and had been taking Chantix.  He developed a sensation of chest discomfort left-sided associated with a rapid pulse rate noted on both a pulse oximeter as well as a monitor at home.  The symptoms lasted about 15 minutes each time.  After the last episode he decided to go to the emergency room.  While there he was wheezing and hypoxemic and was about to sign out against advice but then vomited and was sent here.  Since admission here he has had negative cardiac enzymes, a negative EKG and has not had any recurrent chest discomfort or any arrhythmias.He normally denies PND or orthopnea or edema.  He readily admits significant situational stress.  Past Medical History:  Diagnosis Date  . Cervical adenopathy    Steel plate C5-C6  . Environmental allergies   . History of chicken pox   . Hyperglycemia    Borderline Daibetes  . Hypertension   . Increased pressure in the eye   . Measles   . Mumps   . Seasonal allergies      Past Surgical History:  Procedure Laterality Date  . Cervical Hardware Placement     . CHOLECYSTECTOMY    . COLONOSCOPY     Q5 yrs  . TONSILLECTOMY    . WISDOM TOOTH EXTRACTION      Allergies:  has No Known Allergies.   Medications: Prior to Admission medications   Medication Sig Start Date End Date Taking? Authorizing Provider  albuterol (PROVENTIL  HFA;VENTOLIN HFA) 108 (90 Base) MCG/ACT inhaler Inhale 2 puffs into the lungs every 6 (six) hours as needed for wheezing or shortness of breath. 11/19/16  Yes Brunetta Jeans, PA-C  Ibuprofen 200 MG CAPS Take 600 mg by mouth every 6 (six) hours as needed (pain).    Yes [provider]  lisinopril-hydrochlorothiazide (ZESTORETIC) 20-12.5 MG tablet Take 1 tablet by mouth daily. 11/19/16  Yes Brunetta Jeans, PA-C  CHANTIX STARTING MONTH PAK 0.5 MG X 11 & 1 MG X 42 tablet TAKE AS DIRECTED ON PACKAGE Patient not taking: Reported on 12/06/2016 12/02/16    Brunetta Jeans, PA-C   Family History: Family Status  Relation Status  . Mother (Not Specified)  . Father (Not Specified)  . Other (Not Specified)  . Sister (Not Specified)  . Cousin (Not Specified)  . Brother (Not Specified)  . Brother (Not Specified)   Social History:   reports that he has quit smoking. His smoking use included Cigarettes. He quit after 3.00 years of use. He has never used smokeless tobacco. He reports that he drinks about 3.0 oz of alcohol per week . He reports that he does not use drugs.   Social History   Social History Narrative  . No narrative on file    Review of Systems: He has been obese.  He has intermittent shortness of breath.  He has had significant depression as well as psychosocial stress related to his wife leaving him.  He has one son.  Has occasional cramping in his legs.  Other than as noted above the remainder of the review of systems is unremarkable.  Physical Exam: BP 138/64 (BP Location: Right Arm)   Pulse 70   Temp (!) 97.4 F (36.3 C) (Oral)   Resp 18   Ht 5\' 8"  (1.727 m)   Wt 99.8 kg (220 lb)   SpO2 91%   BMI 33.45 kg/m   General appearance: pleasant mildly obese male in no acute distress Head: Normocephalic, without obvious abnormality, atraumatic, balding male hair pattern Eyes: conjunctivae/corneas clear. PERRL, EOM's intact. Fundi Not examined Neck: no adenopathy, no carotid bruit, no JVD and supple, symmetrical, trachea midline Lungs: clear to auscultation bilaterally Heart: regular rate and rhythm, S1, S2 normal, no murmur, click, rub or gallop Abdomen: soft, non-tender; bowel sounds normal; no masses,  no organomegaly Rectal: deferred Extremities: extremities normal, atraumatic, no cyanosis or edema Pulses: 2+ and symmetric Skin: Skin color, texture, turgor normal. No rashes or lesions Neurologic: Grossly normal Psych: Alert and oriented x 3 Labs: CBC  Recent Labs  12/06/16 0325  12/07/16 0657  WBC 7.7  < >  12.1*  RBC 5.43  < > 5.11  HGB 17.8*  < > 16.4  HCT 50.3  < > 47.8  PLT 152  < > 160  MCV 92.6  < > 93.5  MCH 32.8  < > 32.1  MCHC 35.4  < > 34.3  RDW 12.6  < > 12.9  LYMPHSABS 2.2  --   --   MONOABS 0.8  --   --   EOSABS 0.2  --   --   BASOSABS 0.0  --   --   < > = values in this interval not displayed. CMP   Recent Labs  12/07/16 0657  NA 134*  K 3.8  CL 94*  CO2 30  GLUCOSE 148*  BUN 17  CREATININE 0.84  CALCIUM 9.1  GFRNONAA >60  GFRAA >60   BNP (last 3  results) BNP    Component Value Date/Time   BNP 21.9 12/06/2016 0325   Cardiac Panel (last 3 results)  Recent Labs  12/06/16 0325 12/06/16 0635 12/06/16 1225  TROPONINI <0.03 <0.03 <0.03     Radiology:  Lungs grossly clear  EKG: Sinus rhythm, voltage for LVH Independently reviewed by me  Signed:  W. Doristine Church MD [facility]   Cardiology Consultant  12/07/2016, 9:20 AM

## 2016-12-07 NOTE — Discharge Instructions (Signed)
Follow with Brunetta Jeans, PA-C in 2-3 weeks  Please get a complete blood count and chemistry panel checked by your Primary MD at your next visit, and again as instructed by your Primary MD. Please get your medications reviewed and adjusted by your Primary MD.  Please request your Primary MD to go over all Hospital Tests and Procedure/Radiological results at the follow up, please get all Hospital records sent to your Prim MD by signing hospital release before you go home.  If you had Pneumonia of Lung problems at the Hospital: Please get a 2 view Chest X ray done in 6-8 weeks after hospital discharge or sooner if instructed by your Primary MD.  If you have Congestive Heart Failure: Please call your Cardiologist or Primary MD anytime you have any of the following symptoms:  1) 3 pound weight gain in 24 hours or 5 pounds in 1 week  2) shortness of breath, with or without a dry hacking cough  3) swelling in the hands, feet or stomach  4) if you have to sleep on extra pillows at night in order to breathe  Follow cardiac low salt diet and 1.5 lit/day fluid restriction.  If you have diabetes Accuchecks 4 times/day, Once in AM empty stomach and then before each meal. Log in all results and show them to your primary doctor at your next visit. If any glucose reading is under 80 or above 300 call your primary MD immediately.  If you have Seizure/Convulsions/Epilepsy: Please do not drive, operate heavy machinery, participate in activities at heights or participate in high speed sports until you have seen by Primary MD or a Neurologist and advised to do so again.  If you had Gastrointestinal Bleeding: Please ask your Primary MD to check a complete blood count within one week of discharge or at your next visit. Your endoscopic/colonoscopic biopsies that are pending at the time of discharge, will also need to followed by your Primary MD.  Get Medicines reviewed and adjusted. Please take all your  medications with you for your next visit with your Primary MD  Please request your Primary MD to go over all hospital tests and procedure/radiological results at the follow up, please ask your Primary MD to get all Hospital records sent to his/her office.  If you experience worsening of your admission symptoms, develop shortness of breath, life threatening emergency, suicidal or homicidal thoughts you must seek medical attention immediately by calling 911 or calling your MD immediately  if symptoms less severe.  You must read complete instructions/literature along with all the possible adverse reactions/side effects for all the Medicines you take and that have been prescribed to you. Take any new Medicines after you have completely understood and accpet all the possible adverse reactions/side effects.   Do not drive or operate heavy machinery when taking Pain medications.   Do not take more than prescribed Pain, Sleep and Anxiety Medications  Special Instructions: If you have smoked or chewed Tobacco  in the last 2 yrs please stop smoking, stop any regular Alcohol  and or any Recreational drug use.  Wear Seat belts while driving.  Please note You were cared for by a hospitalist during your hospital stay. If you have any questions about your discharge medications or the care you received while you were in the hospital after you are discharged, you can call the unit and asked to speak with the hospitalist on call if the hospitalist that took care of you is not available.  Once you are discharged, your primary care physician will handle any further medical issues. Please note that NO REFILLS for any discharge medications will be authorized once you are discharged, as it is imperative that you return to your primary care physician (or establish a relationship with a primary care physician if you do not have one) for your aftercare needs so that they can reassess your need for medications and monitor your  lab values.  You can reach the hospitalist office at phone [phone] or fax [phone]   If you do not have a primary care physician, you can call [phone] for a physician referral.  Activity: As tolerated with Full fall precautions use walker/cane & assistance as needed  Diet: heart healthy  Disposition Home

## 2016-12-07 NOTE — Discharge Summary (Signed)
Physician Discharge Summary  Scott Jensen NWG:[id number] DOB: [birth year] DOA: 12/06/2016  PCP: Brunetta Jeans, PA-C  Admit date: 12/06/2016 Discharge date: 12/07/2016  Admitted From: home Disposition:  home  Recommendations for Outpatient Follow-up:  1. Follow up with Dr. Wynonia Lawman in 2 days as an outpatient  Louise: none Equipment/Devices: none  Discharge Condition: stable CODE STATUS: Full Code Diet recommendation: heart healthy  HPI: Per Dr. Jamse Arn, Scott Jensen is an 61 y.o. male with past medical history significant for hypertension, hyperlipidemia and COPD who was in his usual state of health until 2 nights prior to admission when he was suddenly woken up from sleep with a sensation of a stretching pain in his substernal area. Patient denied shortness of breath or palpitations during that time however he did check his pulse and O2 sat urination with his own pulse oximeter at his bedside. At that time he noted that his heart rate was 160 with normal O2 saturations. Patient said he tried to calm himself down to practice deep breathing and after about 20 minutes his heart rate dropped down to 100 and then subsequently down to 80. Patient noted there was no fluctuation in his heart rate while he was running 160. Patient denied any dizziness at that time. Patient subsequently went back to sleep and went to work the following day without difficulty. Last night however patient had another episode of being woken up with same sensations of a stretching aching feeling in his substernal area. Once again his heart rate was noted to be 160 with his pulse ox. Patient presented to the ED however heart rate had normalized by the time he arrived. On arrival in the ED, patient was noted to be somewhat hypoxic and short of breath. Patient states he didn't really feel short of breath until he got to the ED. He admits to a diagnosis of probable COPD with rare use of albuterol inhaler 1-2 times a  month.  ED Course:  In the ED patient was treated with IV steroids and inhaled bronchodilators with improvement in his O2 saturations and an shortness of breath. He has had no further recurrence of tachycardia but is transferred to [place] for further workup and evaluation of tachycardia.  Hospital Course: Discharge Diagnoses:  Active Problems:   Hypertensive heart disease without CHF   Hyperlipidemia   COPD with acute exacerbation (HCC)   CAD (coronary artery disease), native coronary artery   OSA (obstructive sleep apnea)   Tachycardia   Chest pain   Chest pain/tachycardia -patient was admitted to the hospital with chest pain rule out as well as 2 episodes of fast heart rate at home.  Cardiology was consulted and have evaluated patient while hospitalized.  His cardiac enzymes remained negative, his EKG was nonischemic and he is telemetry did not reveal any episodes of SVT/A. Fib/flutter.  He underwent a 2D echo which showed normal systolic function with an ejection fraction 55-60%, mild LVH, and no wall motion abnormalities.  Cardiology is recommending diltiazem 180 mg to suppress any potential arrhythmia, he will have an outpatient event monitor on Monday in Dr. Thurman Coyer office, and patient will undergo an outpatient stress test.  His chest pain resolved, he was back to baseline ambulating in the hallway without significant elevation in his heart rate or respiratory issues, was discharged home in stable condition. COPD with acute exacerbation -never formally diagnosed, however patient with about a 20-pack-year of smoking, quit about a week ago.  He did have cough with sputum  production as well as wheezing, improving on the day of discharge, ambulating in the hallway on room air without significant dyspnea.  He will be discharged home on a prednisone taper, azithromycin for a few days.  He will be started on Spiriva and he is to continue his home albuterol.  Advised patient to go to his PCP in 2-3 weeks  and requests referral for evaluation for COPD. Hypertension -resume home medications.  He was advised to stop ibuprofen Coronary artery disease -noted as calcifications on imaging, will get stress test as an outpatient per cardiology Diet-controlled diabetes -appears stable Obstructive sleep apnea -patient diagnosed with this in the past several years ago, not treated, recommend reevaluation and CPAP if needed   Discharge Instructions   Allergies as of 12/07/2016   No Known Allergies     Medication List    STOP taking these medications   Ibuprofen 200 MG Caps     TAKE these medications   albuterol 108 (90 Base) MCG/ACT inhaler Commonly known as:  PROVENTIL HFA;VENTOLIN HFA Inhale 2 puffs into the lungs every 6 (six) hours as needed for wheezing or shortness of breath.   azithromycin 250 MG tablet Commonly known as:  ZITHROMAX Take 1 tablet (250 mg total) by mouth daily.   CHANTIX STARTING MONTH PAK 0.5 MG X 11 & 1 MG X 42 tablet Generic drug:  varenicline TAKE AS DIRECTED ON PACKAGE   diltiazem 180 MG 24 hr capsule Commonly known as:  CARDIZEM CD Take 1 capsule (180 mg total) by mouth daily.   lisinopril-hydrochlorothiazide 20-12.5 MG tablet Commonly known as:  ZESTORETIC Take 1 tablet by mouth daily.   predniSONE 10 MG tablet Commonly known as:  DELTASONE Take 4 tablets (40 mg total) by mouth daily with breakfast. 4 tablets daily x 2 days then 3 tablets daily for 2 days then 2 tablets daily for 2 days then 1 tablet daily for 2 days then stop   tiotropium 18 MCG inhalation capsule Commonly known as:  SPIRIVA HANDIHALER Place 1 capsule (18 mcg total) into inhaler and inhale daily.      Follow-up Information    Jacolyn Reedy, MD. Call in 2 day(s).   Specialty:  Cardiology Why:  call office on Monday and come by to get a monitor put on as welll as schedule a stress test Contact information: [address] [place]  [id number] [phone]        Brunetta Jeans, [place]. Schedule an appointment as soon as possible for a visit in 2 week(s).   Specialty:  Family Medicine Why:  to have referral for COPD and sleep apnea evaluation Contact information: [address] [place] [address] [place] [address] [phone]          No Known Allergies  Consultations:    Procedures/Studies:  2D echo  Study Conclusions - Left ventricle: The cavity size was normal. Wall thickness was increased in a pattern of mild LVH. Systolic function was normal. The estimated ejection fraction was in the range of 55% to 60%. Wall motion was normal; there were no regional wall motion abnormalities. - Aortic valve: There was trivial regurgitation.  Dg Chest Portable 1 View  Result Date: 12/06/2016 CLINICAL DATA:  Acute onset of left upper anterior chest pain and cough. Tachycardia. Initial encounter. EXAM: PORTABLE CHEST 1 VIEW COMPARISON:  Chest radiograph and CTA of the chest performed 04/22/2016 FINDINGS: The lungs are well-aerated. Mild vascular congestion is noted. There is no evidence  of focal opacification, pleural effusion or pneumothorax. The cardiomediastinal silhouette is within normal limits. No acute osseous abnormalities are seen. IMPRESSION: Mild vascular congestion noted.  Lungs remain grossly clear. Electronically Signed   By: Garald Balding M.D.   On: 12/06/2016 03:55      Subjective: - no chest pain, shortness of breath, no abdominal pain, nausea or vomiting.   Discharge Exam: Vitals:   12/07/16 0758 12/07/16 1314  BP:  (!) 110/57  Pulse:  70  Resp:  18  Temp:  97.6 F (36.4 C)  SpO2: 91% 95%   Vitals:   12/06/16 2053 12/07/16 0523 12/07/16 0758 12/07/16 1314  BP: (!) 124/58 138/64  (!) 110/57  Pulse: 80 70  70  Resp: 18 18  18   Temp: 97.8 F (36.6 C) (!) 97.4 F (36.3 C)  97.6 F (36.4 C)  TempSrc: Oral Oral  Oral  SpO2: 95% 97% 91% 95%  Weight:      Height:        General: Pt is alert,  awake, not in acute distress Cardiovascular: RRR, S1/S2 +, no rubs, no gallops Respiratory: CTA bilaterally, no wheezing, no rhonchi   The results of significant diagnostics from this hospitalization (including imaging, microbiology, ancillary and laboratory) are listed below for reference.     Microbiology: No results found for this or any previous visit (from the past 240 hour(s)).   Labs: BNP (last 3 results)  Recent Labs  12/06/16 0325  BNP 20.9   Basic Metabolic Panel:  Recent Labs Lab 12/06/16 0325 12/06/16 1646 12/07/16 0657  NA 132*  --  134*  K 4.0  --  3.8  CL 94*  --  94*  CO2 27  --  30  GLUCOSE 97  --  148*  BUN 17  --  17  CREATININE 0.80 1.00 0.84  CALCIUM 9.2  --  9.1   Liver Function Tests: No results for input(s): AST, ALT, ALKPHOS, BILITOT, PROT, ALBUMIN in the last 168 hours. No results for input(s): LIPASE, AMYLASE in the last 168 hours. No results for input(s): AMMONIA in the last 168 hours. CBC:  Recent Labs Lab 12/06/16 0325 12/06/16 1646 12/07/16 0657  WBC 7.7 10.3 12.1*  NEUTROABS 4.5  --   --   HGB 17.8* 17.9* 16.4  HCT 50.3 50.0 47.8  MCV 92.6 91.9 93.5  PLT 152 163 160   Cardiac Enzymes:  Recent Labs Lab 12/06/16 0325 12/06/16 0635 12/06/16 1225  TROPONINI <0.03 <0.03 <0.03   BNP: Invalid input(s): POCBNP CBG:  Recent Labs Lab 12/07/16 0744  GLUCAP 140*   D-Dimer  Recent Labs  12/06/16 1904  DDIMER <0.27   Hgb A1c No results for input(s): HGBA1C in the last 72 hours. Lipid Profile No results for input(s): CHOL, HDL, LDLCALC, TRIG, CHOLHDL, LDLDIRECT in the last 72 hours. Thyroid function studies No results for input(s): TSH, T4TOTAL, T3FREE, THYROIDAB in the last 72 hours.  Invalid input(s): FREET3 Anemia work up No results for input(s): VITAMINB12, FOLATE, FERRITIN, TIBC, IRON, RETICCTPCT in the last 72 hours. Urinalysis    Component Value Date/Time   COLORURINE YELLOW 11/05/2016 0900    APPEARANCEUR CLEAR 11/05/2016 0900   LABSPEC 1.010 11/05/2016 0900   PHURINE 6.0 11/05/2016 0900   GLUCOSEU NEGATIVE 11/05/2016 0900   HGBUR NEGATIVE 11/05/2016 0900   BILIRUBINUR NEGATIVE 11/05/2016 0900   KETONESUR NEGATIVE 11/05/2016 0900   UROBILINOGEN 0.2 11/05/2016 0900   NITRITE NEGATIVE 11/05/2016 0900   LEUKOCYTESUR NEGATIVE 11/05/2016 0900  Sepsis Labs Invalid input(s): PROCALCITONIN,  WBC,  LACTICIDVEN Microbiology No results found for this or any previous visit (from the past 240 hour(s)).   Time coordinating discharge: 35 minutes  SIGNED:  Marzetta Board, MD  [facility] 12/07/2016, 3:21 PM Pager [phone]  If 7PM-7AM, please contact night-coverage [email or web address] Password [email or web address]

## 2016-12-09 ENCOUNTER — Telehealth: Payer: Self-pay

## 2016-12-09 ENCOUNTER — Encounter: Payer: Self-pay | Admitting: Physician Assistant

## 2016-12-09 NOTE — Telephone Encounter (Signed)
Transition Care Management Follow-up Telephone Call   Date discharged? 12/07/2016   How have you been since you were released from the hospital? "good"   Do you understand why you were in the hospital? yes, "my heart races at night"   Do you understand the discharge instructions? yes   Where were you discharged to? Home. Lives alone.    Items Reviewed:  Medications reviewed: yes  Allergies reviewed: yes  Dietary changes reviewed: yes, heart healthy  Referrals reviewed: yes   Functional Questionnaire:   Activities of Daily Living (ADLs):   He states they are independent in the following: ambulation, bathing and hygiene, feeding, continence, grooming, toileting and dressing States they require assistance with the following: None.    Any transportation issues/concerns?: no   Any patient concerns? no   Confirmed importance and date/time of follow-up visits scheduled yes  Provider Appointment booked with PCP, 12/16/16 @ 11.   Confirmed with patient if condition begins to worsen call PCP or go to the ER.  Patient was given the office number and encouraged to call back with question or concerns.  : yes

## 2016-12-16 ENCOUNTER — Encounter: Payer: Self-pay | Admitting: Physician Assistant

## 2016-12-16 ENCOUNTER — Ambulatory Visit (INDEPENDENT_AMBULATORY_CARE_PROVIDER_SITE_OTHER): Payer: Managed Care, Other (non HMO) | Admitting: Physician Assistant

## 2016-12-16 VITALS — BP 140/62 | HR 72 | Temp 98.2°F | Resp 14 | Ht 68.0 in | Wt 232.0 lb

## 2016-12-16 DIAGNOSIS — R079 Chest pain, unspecified: Secondary | ICD-10-CM

## 2016-12-16 DIAGNOSIS — Z720 Tobacco use: Secondary | ICD-10-CM

## 2016-12-16 DIAGNOSIS — J441 Chronic obstructive pulmonary disease with (acute) exacerbation: Secondary | ICD-10-CM | POA: Diagnosis not present

## 2016-12-16 MED ORDER — LISINOPRIL-HYDROCHLOROTHIAZIDE 20-25 MG PO TABS: 1.0000 | ORAL_TABLET | Freq: Every day | ORAL | 1 refills | Status: DC

## 2016-12-16 NOTE — Patient Instructions (Addendum)
Please stop current dose of lisinopril-hctz and start the new dose sent to local pharmacy. Continue the diltiazem as directed. Continue DASH diet below. Check BP daily and record. Bring BP machine to follow-up in 2-3 weeks.  I will try to get results from Dr. Wynonia Lawman.to review. Please take Spiriva as directed.

## 2016-12-16 NOTE — Progress Notes (Signed)
Pre visit review using our clinic review tool, if applicable. No additional management support is needed unless otherwise documented below in the visit note. 

## 2016-12-18 NOTE — Progress Notes (Signed)
Patient presents to clinic today for hospital follow-up. Patient presented to ER on 12/06/2016 c/o substernal chest pain waking him from sleep. Associated symptoms included tachycardia to 160 bpm on home pulse ox machine. Patient was evaluated and felt to be having a COPD exacerbation. Was given IC steroids and bronchodilators with improvement in oxygen saturations and SOB. Tachycardia resolved in ER but patient was admitted for further evaluation.  During admission, cardiac enzymes remained negative, EKG was non-ischemic and telemetry did not reveal any arrhythmias. Echo revealed normal systolic function with EF at 55-60% but mild LVH noted. Cardiology started 180 mg Diltiazem and OP holter monitor and stress test. For COPD exacerbation, patient was discharged home with prednisone taper, Azithromycin and Spiriva.  Patient states that since discharge he has followed up with Cardiology (Dr. Wynonia Lawman). Has OP stress test and was told everything looked good. Is currently wearing holter monitor. Denies any recurrence of chest pain or tachycardia. Is staying hydrated and getting plenty of rest. Has stopped smoking. Has finished antibiotics and steroid taper. Notes wheezing back to baseline. Denies productive cough, fever, chills or fatigue. Is not using Spiriva as directed.   Past Medical History:  Diagnosis Date  . Cervical adenopathy    Steel plate C5-C6  . Environmental allergies   . History of chicken pox   . Hyperglycemia    Borderline Daibetes  . Hypertension   . Increased pressure in the eye   . Measles   . Mumps   . Seasonal allergies     Current Outpatient Prescriptions on File Prior to Visit  Medication Sig Dispense Refill  . albuterol (PROVENTIL HFA;VENTOLIN HFA) 108 (90 Base) MCG/ACT inhaler Inhale 2 puffs into the lungs every 6 (six) hours as needed for wheezing or shortness of breath. 1 Inhaler 3  . CHANTIX STARTING MONTH PAK 0.5 MG X 11 & 1 MG X 42 tablet TAKE AS DIRECTED ON  PACKAGE 53 tablet 1  . diltiazem (CARDIZEM CD) 180 MG 24 hr capsule Take 1 capsule (180 mg total) by mouth daily. 30 capsule 1  . tiotropium (SPIRIVA HANDIHALER) 18 MCG inhalation capsule Place 1 capsule (18 mcg total) into inhaler and inhale daily. (Patient not taking: Reported on 12/16/2016) 30 capsule 2   No current facility-administered medications on file prior to visit.     No Known Allergies  Family History  Problem Relation Age of Onset  . Brain cancer Mother 70       Deceased  . Liver cancer Mother        Mets  . Anxiety disorder Mother   . Colon cancer Father 73       Deceased  . Alcoholism Father   . Cancer Other        Aunts & Uncles  . Leukemia Sister   . Leukemia Cousin   . Hypertension Brother   . Mental illness Brother     Social History   Social History  . Marital status: Legally Separated    Spouse name: N/A  . Number of children: N/A  . Years of education: N/A   Social History Main Topics  . Smoking status: Former Smoker    Years: 3.00    Types: Cigarettes  . Smokeless tobacco: Never Used  . Alcohol use 3.0 oz/week    5 Standard drinks or equivalent per week     Comment: beer only  . Drug use: No  . Sexual activity: Yes    Partners: Female   Other Topics Concern  .  None   Social History Narrative  . None   Review of Systems - See HPI.  All other ROS are negative.  BP 140/62   Pulse 72   Temp 98.2 F (36.8 C) (Oral)   Resp 14   Ht _0  (1.727 m)   Wt 232 lb (105.2 kg)   SpO2 97%   BMI 35.28 kg/m   Physical Exam  Constitutional: He is oriented to person, place, and time and well-developed, well-nourished, and in no distress.  HENT:  Head: Normocephalic and atraumatic.  Eyes: Conjunctivae are normal.  Neck: Neck supple. No thyromegaly present.  Cardiovascular: Normal rate, regular rhythm, normal heart sounds and intact distal pulses.   Pulmonary/Chest: Effort normal. No respiratory distress. He has wheezes. He has no rales. He  exhibits no tenderness.  Neurological: He is alert and oriented to person, place, and time.  Skin: Skin is warm and dry. No rash noted.  Psychiatric: Affect normal.  Vitals reviewed.  Recent Results (from the past 2160 hour(s))  Comprehensive metabolic panel     Status: Abnormal   Collection Time: 11/05/16  9:00 AM  Result Value Ref Range   Sodium 137 135 - 145 mEq/L   Potassium 4.6 3.5 - 5.1 mEq/L   Chloride 99 96 - 112 mEq/L   CO2 31 19 - 32 mEq/L   Glucose, Bld 111 (H) 70 - 99 mg/dL   BUN 11 6 - 23 mg/dL   Creatinine, Ser 0.87 0.40 - 1.50 mg/dL   Total Bilirubin 0.8 0.2 - 1.2 mg/dL   Alkaline Phosphatase 61 39 - 117 U/L   AST 15 0 - 37 U/L   ALT 20 0 - 53 U/L   Total Protein 7.0 6.0 - 8.3 g/dL   Albumin 4.5 3.5 - 5.2 g/dL   Calcium 9.5 8.4 - 10.5 mg/dL   GFR 94.42 >60.00 mL/min  CBC with Differential/Platelet     Status: Abnormal   Collection Time: 11/05/16  9:00 AM  Result Value Ref Range   WBC 6.2 4.0 - 10.5 K/uL   RBC 5.32 4.22 - 5.81 Mil/uL   Hemoglobin 17.5 (H) 13.0 - 17.0 g/dL   HCT 50.7 39.0 - 52.0 %   MCV 95.3 78.0 - 100.0 fl   MCHC 34.6 30.0 - 36.0 g/dL   RDW 13.5 11.5 - 15.5 %   Platelets 136.0 (L) 150.0 - 400.0 K/uL   Neutrophils Relative % 65.6 43.0 - 77.0 %   Lymphocytes Relative 22.9 12.0 - 46.0 %   Monocytes Relative 9.5 3.0 - 12.0 %   Eosinophils Relative 1.1 0.0 - 5.0 %   Basophils Relative 0.9 0.0 - 3.0 %   Neutro Abs 4.1 1.4 - 7.7 K/uL   Lymphs Abs 1.4 0.7 - 4.0 K/uL   Monocytes Absolute 0.6 0.1 - 1.0 K/uL   Eosinophils Absolute 0.1 0.0 - 0.7 K/uL   Basophils Absolute 0.1 0.0 - 0.1 K/uL  Hemoglobin A1c     Status: None   Collection Time: 11/05/16  9:00 AM  Result Value Ref Range   Hgb A1c MFr Bld 6.2 4.6 - 6.5 %    Comment: Glycemic Control Guidelines for People with Diabetes:Non Diabetic:  <6%Goal of Therapy: <7%Additional Action Suggested:  >8%   Lipid panel     Status: Abnormal   Collection Time: 11/05/16  9:00 AM  Result Value Ref Range     Cholesterol 175 0 - 200 mg/dL    Comment: ATP III Classification  Desirable:  < 200 mg/dL               Borderline High:  200 - 239 mg/dL          High:  > = 240 mg/dL   Triglycerides 123.0 0.0 - 149.0 mg/dL    Comment: Normal:  <150 mg/dLBorderline High:  150 - 199 mg/dL   HDL 48.70 >39.00 mg/dL   VLDL 24.6 0.0 - 40.0 mg/dL   LDL Cholesterol 101 (H) 0 - 99 mg/dL   Total CHOL/HDL Ratio 4     Comment:                Men          Women1/2 Average Risk     3.4          3.3Average Risk          5.0          4.42X Average Risk          9.6          7.13X Average Risk          15.0          11.0                       NonHDL 125.80     Comment: NOTE:  Non-HDL goal should be 30 mg/dL higher than patient's LDL goal (i.e. LDL goal of < 70 mg/dL, would have non-HDL goal of < 100 mg/dL)  TSH     Status: None   Collection Time: 11/05/16  9:00 AM  Result Value Ref Range   TSH 1.05 0.35 - 4.50 uIU/mL  Urinalysis, Routine w reflex microscopic     Status: None   Collection Time: 11/05/16  9:00 AM  Result Value Ref Range   Color, Urine YELLOW Yellow;Lt. Yellow   APPearance CLEAR Clear   Specific Gravity, Urine 1.010 1.000 - 1.030   pH 6.0 5.0 - 8.0   Total Protein, Urine NEGATIVE Negative   Urine Glucose NEGATIVE Negative   Ketones, ur NEGATIVE Negative   Bilirubin Urine NEGATIVE Negative   Hgb urine dipstick NEGATIVE Negative   Urobilinogen, UA 0.2 0.0 - 1.0   Leukocytes, UA NEGATIVE Negative   Nitrite NEGATIVE Negative   WBC, UA 0-2/hpf 0-2/hpf   RBC / HPF none seen 0-2/hpf   Squamous Epithelial / LPF Rare(0-4/hpf) Rare(0-4/hpf)  PSA     Status: None   Collection Time: 11/05/16  9:00 AM  Result Value Ref Range   PSA 0.59 0.10 - 4.00 ng/mL  CBC     Status: Abnormal   Collection Time: 11/19/16  9:29 AM  Result Value Ref Range   WBC 7.0 4.0 - 10.5 K/uL   RBC 5.34 4.22 - 5.81 Mil/uL   Platelets 152.0 150.0 - 400.0 K/uL   Hemoglobin 17.6 (H) 13.0 - 17.0 g/dL   HCT 51.2 39.0 - 52.0 %    MCV 95.8 78.0 - 100.0 fl   MCHC 34.4 30.0 - 36.0 g/dL   RDW 13.8 11.5 - 15.5 %  CBC with Differential/Platelet     Status: Abnormal   Collection Time: 12/06/16  3:25 AM  Result Value Ref Range   WBC 7.7 4.0 - 10.5 K/uL   RBC 5.43 4.22 - 5.81 MIL/uL   Hemoglobin 17.8 (H) 13.0 - 17.0 g/dL   HCT 50.3 39.0 - 52.0 %   MCV 92.6 78.0 - 100.0 fL  MCH 32.8 26.0 - 34.0 pg   MCHC 35.4 30.0 - 36.0 g/dL   RDW 12.6 11.5 - 15.5 %   Platelets 152 150 - 400 K/uL   Neutrophils Relative % 58 %   Neutro Abs 4.5 1.7 - 7.7 K/uL   Lymphocytes Relative 29 %   Lymphs Abs 2.2 0.7 - 4.0 K/uL   Monocytes Relative 10 %   Monocytes Absolute 0.8 0.1 - 1.0 K/uL   Eosinophils Relative 2 %   Eosinophils Absolute 0.2 0.0 - 0.7 K/uL   Basophils Relative 1 %   Basophils Absolute 0.0 0.0 - 0.1 K/uL  Basic metabolic panel     Status: Abnormal   Collection Time: 12/06/16  3:25 AM  Result Value Ref Range   Sodium 132 (L) 135 - 145 mmol/L   Potassium 4.0 3.5 - 5.1 mmol/L   Chloride 94 (L) 101 - 111 mmol/L   CO2 27 22 - 32 mmol/L   Glucose, Bld 97 65 - 99 mg/dL   BUN 17 6 - 20 mg/dL   Creatinine, Ser 0.80 0.61 - 1.24 mg/dL   Calcium 9.2 8.9 - 10.3 mg/dL   GFR calc non Af Amer >60 >60 mL/min   GFR calc Af Amer >60 >60 mL/min    Comment: (NOTE) The eGFR has been calculated using the CKD EPI equation. This calculation has not been validated in all clinical situations. eGFR's persistently <60 mL/min signify possible Chronic Kidney Disease.    Anion gap 11 5 - 15  Troponin I     Status: None   Collection Time: 12/06/16  3:25 AM  Result Value Ref Range   Troponin I <0.03 <0.03 ng/mL  Brain natriuretic peptide     Status: None   Collection Time: 12/06/16  3:25 AM  Result Value Ref Range   B Natriuretic Peptide 21.9 0.0 - 100.0 pg/mL  Rapid urine drug screen (hospital performed)     Status: None   Collection Time: 12/06/16  3:29 AM  Result Value Ref Range   Opiates NONE DETECTED NONE DETECTED   Cocaine NONE  DETECTED NONE DETECTED   Benzodiazepines NONE DETECTED NONE DETECTED   Amphetamines NONE DETECTED NONE DETECTED   Tetrahydrocannabinol NONE DETECTED NONE DETECTED   Barbiturates NONE DETECTED NONE DETECTED    Comment:        DRUG SCREEN FOR MEDICAL PURPOSES ONLY.  IF CONFIRMATION IS NEEDED FOR ANY PURPOSE, NOTIFY LAB WITHIN 5 DAYS.        LOWEST DETECTABLE LIMITS FOR URINE DRUG SCREEN Drug Class       Cutoff (ng/mL) Amphetamine      1000 Barbiturate      200 Benzodiazepine   638 Tricyclics       177 Opiates          300 Cocaine          300 THC              50   Troponin I (q 6hr x 3)     Status: None   Collection Time: 12/06/16  6:35 AM  Result Value Ref Range   Troponin I <0.03 <0.03 ng/mL  Troponin I (q 6hr x 3)     Status: None   Collection Time: 12/06/16 12:25 PM  Result Value Ref Range   Troponin I <0.03 <0.03 ng/mL  HIV antibody (Routine Testing)     Status: None   Collection Time: 12/06/16  4:46 PM  Result Value Ref Range   HIV  Screen 4th Generation wRfx Non Reactive Non Reactive    Comment: (NOTE) Performed At: [facility] [place], [place] [id number] Lindon Romp MD OI:[id number]   CBC     Status: Abnormal   Collection Time: 12/06/16  4:46 PM  Result Value Ref Range   WBC 10.3 4.0 - 10.5 K/uL   RBC 5.44 4.22 - 5.81 MIL/uL   Hemoglobin 17.9 (H) 13.0 - 17.0 g/dL   HCT 50.0 39.0 - 52.0 %   MCV 91.9 78.0 - 100.0 fL   MCH 32.9 26.0 - 34.0 pg   MCHC 35.8 30.0 - 36.0 g/dL   RDW 12.7 11.5 - 15.5 %   Platelets 163 150 - 400 K/uL  Creatinine, serum     Status: None   Collection Time: 12/06/16  4:46 PM  Result Value Ref Range   Creatinine, Ser 1.00 0.61 - 1.24 mg/dL   GFR calc non Af Amer >60 >60 mL/min   GFR calc Af Amer >60 >60 mL/min    Comment: (NOTE) The eGFR has been calculated using the CKD EPI equation. This calculation has not been validated in all clinical situations. eGFR's persistently <60 mL/min signify possible  Chronic Kidney Disease.   D-dimer, quantitative (not at [facility])     Status: None   Collection Time: 12/06/16  7:04 PM  Result Value Ref Range   D-Dimer, Quant <0.27 0.00 - 0.50 ug/mL-FEU    Comment: (NOTE) At the manufacturer cut-off of 0.50 ug/mL FEU, this assay has been documented to exclude PE with a sensitivity and negative predictive value of 97 to 99%.  At this time, this assay has not been approved by the FDA to exclude DVT/VTE. Results should be correlated with clinical presentation.   Basic metabolic panel     Status: Abnormal   Collection Time: 12/07/16  6:57 AM  Result Value Ref Range   Sodium 134 (L) 135 - 145 mmol/L   Potassium 3.8 3.5 - 5.1 mmol/L   Chloride 94 (L) 101 - 111 mmol/L   CO2 30 22 - 32 mmol/L   Glucose, Bld 148 (H) 65 - 99 mg/dL   BUN 17 6 - 20 mg/dL   Creatinine, Ser 0.84 0.61 - 1.24 mg/dL   Calcium 9.1 8.9 - 10.3 mg/dL   GFR calc non Af Amer >60 >60 mL/min   GFR calc Af Amer >60 >60 mL/min    Comment: (NOTE) The eGFR has been calculated using the CKD EPI equation. This calculation has not been validated in all clinical situations. eGFR's persistently <60 mL/min signify possible Chronic Kidney Disease.    Anion gap 10 5 - 15  CBC     Status: Abnormal   Collection Time: 12/07/16  6:57 AM  Result Value Ref Range   WBC 12.1 (H) 4.0 - 10.5 K/uL   RBC 5.11 4.22 - 5.81 MIL/uL   Hemoglobin 16.4 13.0 - 17.0 g/dL   HCT 47.8 39.0 - 52.0 %   MCV 93.5 78.0 - 100.0 fL   MCH 32.1 26.0 - 34.0 pg   MCHC 34.3 30.0 - 36.0 g/dL   RDW 12.9 11.5 - 15.5 %   Platelets 160 150 - 400 K/uL  Glucose, capillary     Status: Abnormal   Collection Time: 12/07/16  7:44 AM  Result Value Ref Range   Glucose-Capillary 140 (H) 65 - 99 mg/dL  ECHOCARDIOGRAM COMPLETE     Status: None   Collection Time: 12/07/16 12:30 PM  Result Value Ref Range  Weight 3,520 oz   Height 68 in   BP 138/64 mmHg    Assessment/Plan: 1. COPD with acute exacerbation (HCC) Exacerbation  resolved. Mild wheeze noted on exam. Patient has stopped smoking. Encouraged patient to restart Spiriva. Referral to Pulmonology placed.  2. Tobacco abuse disorder Patient has stopped smoking which is to be commended. Will set up for lung cancer screening.   3. Chest pain, unspecified type ER workup negative. Holter study in process. Continue current regimen. Follow-up with Cardiology.    Leeanne Rio, PA-C

## 2017-01-17 ENCOUNTER — Other Ambulatory Visit: Payer: Self-pay | Admitting: Physician Assistant

## 2017-01-20 ENCOUNTER — Encounter: Payer: Self-pay | Admitting: Emergency Medicine

## 2017-03-06 ENCOUNTER — Telehealth: Payer: Self-pay | Admitting: Emergency Medicine

## 2017-03-06 NOTE — Telephone Encounter (Signed)
LMOVM advising patient he is due for a follow up appt for Blood pressure.

## 2017-03-10 ENCOUNTER — Other Ambulatory Visit: Payer: Self-pay | Admitting: Physician Assistant

## 2017-03-12 ENCOUNTER — Other Ambulatory Visit: Payer: Self-pay | Admitting: Physician Assistant

## 2017-04-01 ENCOUNTER — Other Ambulatory Visit: Payer: Self-pay | Admitting: Emergency Medicine

## 2017-04-01 MED ORDER — LISINOPRIL-HYDROCHLOROTHIAZIDE 20-25 MG PO TABS: 1.0000 | ORAL_TABLET | Freq: Every day | ORAL | 0 refills | Status: DC

## 2017-04-19 ENCOUNTER — Other Ambulatory Visit: Payer: Self-pay | Admitting: Physician Assistant

## 2017-06-07 ENCOUNTER — Other Ambulatory Visit: Payer: Self-pay | Admitting: Physician Assistant

## 2017-06-23 ENCOUNTER — Encounter: Payer: Self-pay | Admitting: Emergency Medicine

## 2017-07-02 ENCOUNTER — Ambulatory Visit: Payer: Managed Care, Other (non HMO) | Admitting: Physician Assistant

## 2017-07-02 ENCOUNTER — Encounter: Payer: Self-pay | Admitting: Physician Assistant

## 2017-07-02 ENCOUNTER — Other Ambulatory Visit: Payer: Self-pay

## 2017-07-02 VITALS — BP 140/72 | HR 82 | Temp 98.5°F | Resp 17 | Ht 68.0 in | Wt 246.0 lb

## 2017-07-02 DIAGNOSIS — I119 Hypertensive heart disease without heart failure: Secondary | ICD-10-CM

## 2017-07-02 DIAGNOSIS — Z720 Tobacco use: Secondary | ICD-10-CM

## 2017-07-02 LAB — BASIC METABOLIC PANEL
BUN: 11 mg/dL (ref 6–23)
CHLORIDE: 96 meq/L (ref 96–112)
CO2: 33 meq/L — AB (ref 19–32)
Calcium: 9.4 mg/dL (ref 8.4–10.5)
Creatinine, Ser: 0.86 mg/dL (ref 0.40–1.50)
GFR: 95.49 mL/min (ref >60.00)
GLUCOSE: 129 mg/dL — AB (ref 70–99)
Potassium: 4.1 mEq/L (ref 3.5–5.1)
Sodium: 134 mEq/L — ABNORMAL LOW (ref 135–145)

## 2017-07-02 LAB — LIPID PANEL
CHOL/HDL RATIO: 5
Cholesterol: 189 mg/dL (ref 0–200)
HDL: 40.5 mg/dL (ref >39.00)
LDL CALC: 119 mg/dL — AB (ref 0–99)
NONHDL: 148.31
Triglycerides: 148 mg/dL (ref 0.0–149.0)
VLDL: 29.6 mg/dL (ref 0.0–40.0)

## 2017-07-02 MED ORDER — ALBUTEROL SULFATE HFA 108 (90 BASE) MCG/ACT IN AERS: 2.0000 | INHALATION_SPRAY | Freq: Four times a day (QID) | RESPIRATORY_TRACT | 3 refills | Status: DC | PRN

## 2017-07-02 MED ORDER — LISINOPRIL-HYDROCHLOROTHIAZIDE 20-12.5 MG PO TABS: 2.0000 | ORAL_TABLET | Freq: Every day | ORAL | 0 refills | Status: DC

## 2017-07-02 MED ORDER — VARENICLINE TARTRATE 0.5 MG X 11 & 1 MG X 42 PO MISC: ORAL | 1 refills | Status: DC

## 2017-07-02 NOTE — Assessment & Plan Note (Addendum)
BP log reviewed. Is above goal most days. Is elevated today. Asymptomatic. Will check BMP today. Increase dose to lisinopril-HCTZ 20-12.5 mg tablet, taking 2 tablets daily. Follow-up 2 weeks for reassessment.

## 2017-07-02 NOTE — Assessment & Plan Note (Signed)
Has restarted smoking. Chantix will be restarted. Follow-up scheduled.

## 2017-07-02 NOTE — Patient Instructions (Signed)
Please go to the lab today for blood work.  I will call you with your results. We will alter treatment regimen(s) if indicated by your results.   When you get your new BP medication dose from ES, start daily as directed. Will be two tablets daily. Keep a check on home BP measurements. Goal is 110-120/70-80. Call me for BP lower than 110/60.   Follow-up in 2 weeks for reassessment.

## 2017-07-02 NOTE — Progress Notes (Signed)
Patient presents to clinic today for hypertension follow-up.  Patient is currently on a regimen of lisinopril-hydrochlorothiazide 20-12.5 mg daily.  Patient endorses taking medication as directed.  Is tolerating well without side effects.  Patient is checking blood pressure at home.  Has brought a log of blood pressures for review today. Patient denies chest pain, palpitations, lightheadedness, dizziness, vision changes or frequent headaches.  Patient endorses that he had quit smoking but has restarted over the past couple months due to some significant stressors at home. Is wanting to restart Chantix as this helped him previously.   Patient is trying to work harder on diet and exercise. Is excited that Spring is near as he loves to be outdoors and has some activities planned.   Past Medical History:  Diagnosis Date  . Cervical adenopathy    Steel plate C5-C6  . Environmental allergies   . History of chicken pox   . Hyperglycemia    Borderline Daibetes  . Hypertension   . Increased pressure in the eye   . Measles   . Mumps   . Seasonal allergies     Current Outpatient Medications on File Prior to Visit  Medication Sig Dispense Refill  . ibuprofen (ADVIL,MOTRIN) 600 MG tablet      No current facility-administered medications on file prior to visit.     No Known Allergies  Family History  Problem Relation Age of Onset  . Brain cancer Mother 39       Deceased  . Liver cancer Mother        Mets  . Anxiety disorder Mother   . Colon cancer Father 24       Deceased  . Alcoholism Father   . Cancer Other        Aunts & Uncles  . Leukemia Sister   . Leukemia Cousin   . Hypertension Brother   . Mental illness Brother     Social History   Socioeconomic History  . Marital status: Legally Separated    Spouse name: None  . Number of children: None  . Years of education: None  . Highest education level: None  Social Needs  . Financial resource strain: None  . Food  insecurity - worry: None  . Food insecurity - inability: None  . Transportation needs - medical: None  . Transportation needs - non-medical: None  Occupational History  . None  Tobacco Use  . Smoking status: Former Smoker    Years: 3.00    Types: Cigarettes  . Smokeless tobacco: Never Used  Substance and Sexual Activity  . Alcohol use: Yes    Alcohol/week: 3.0 oz    Types: 5 Standard drinks or equivalent per week    Comment: beer only  . Drug use: No  . Sexual activity: Yes    Partners: Female  Other Topics Concern  . None  Social History Narrative  . None   Review of Systems - See HPI.  All other ROS are negative.  BP 140/72 (BP Location: Left Arm, Cuff Size: Normal)   Pulse 82   Temp 98.5 F (36.9 C) (Oral)   Resp 17   Ht 5\' 8"  (1.727 m)   Wt 246 lb (111.6 kg)   SpO2 92%   BMI 37.40 kg/m   Physical Exam  Constitutional: He is oriented to person, place, and time and well-developed, well-nourished, and in no distress.  HENT:  Head: Normocephalic and atraumatic.  Eyes: Conjunctivae are normal.  Cardiovascular: Normal rate, regular  rhythm, normal heart sounds and intact distal pulses.  Pulmonary/Chest: Effort normal and breath sounds normal. No respiratory distress. He has no wheezes. He has no rales. He exhibits no tenderness.  Neurological: He is alert and oriented to person, place, and time.  Skin: Skin is warm and dry. No rash noted.  Psychiatric: Affect normal.  Vitals reviewed.  Assessment/Plan: Hypertensive heart disease without CHF BP log reviewed. Is above goal most days. Is elevated today. Asymptomatic. Will check BMP today. Increase dose to lisinopril-HCTZ 20-12.5 mg tablet, taking 2 tablets daily. Follow-up 2 weeks for reassessment.   Tobacco abuse disorder Has restarted smoking. Chantix will be restarted. Follow-up scheduled.     Leeanne Rio, PA-C

## 2017-07-03 ENCOUNTER — Other Ambulatory Visit (INDEPENDENT_AMBULATORY_CARE_PROVIDER_SITE_OTHER): Payer: Managed Care, Other (non HMO)

## 2017-07-03 DIAGNOSIS — R7309 Other abnormal glucose: Secondary | ICD-10-CM | POA: Diagnosis not present

## 2017-07-03 LAB — HEMOGLOBIN A1C: Hgb A1c MFr Bld: 6.5 % (ref 4.6–6.5)

## 2017-07-04 ENCOUNTER — Other Ambulatory Visit: Payer: Self-pay | Admitting: Physician Assistant

## 2017-07-04 ENCOUNTER — Encounter: Payer: Self-pay | Admitting: Emergency Medicine

## 2017-07-04 DIAGNOSIS — E785 Hyperlipidemia, unspecified: Secondary | ICD-10-CM

## 2017-07-04 MED ORDER — ATORVASTATIN CALCIUM 10 MG PO TABS: 10.0000 mg | ORAL_TABLET | Freq: Every day | ORAL | 1 refills | Status: DC

## 2017-07-04 MED ORDER — COQ10 100 MG PO CAPS: 1.0000 | ORAL_CAPSULE | Freq: Every day | ORAL | 0 refills | Status: DC

## 2017-07-05 ENCOUNTER — Other Ambulatory Visit: Payer: Self-pay | Admitting: Physician Assistant

## 2017-07-16 ENCOUNTER — Encounter: Payer: Self-pay | Admitting: Physician Assistant

## 2017-07-16 ENCOUNTER — Ambulatory Visit: Payer: Managed Care, Other (non HMO) | Admitting: Physician Assistant

## 2017-07-16 ENCOUNTER — Other Ambulatory Visit: Payer: Self-pay

## 2017-07-16 VITALS — BP 110/68 | HR 75 | Temp 98.1°F | Resp 16 | Ht 68.0 in | Wt 251.0 lb

## 2017-07-16 DIAGNOSIS — I119 Hypertensive heart disease without heart failure: Secondary | ICD-10-CM

## 2017-07-16 LAB — BASIC METABOLIC PANEL
BUN: 16 mg/dL (ref 6–23)
CHLORIDE: 98 meq/L (ref 96–112)
CO2: 27 meq/L (ref 19–32)
CREATININE: 0.81 mg/dL (ref 0.40–1.50)
Calcium: 10 mg/dL (ref 8.4–10.5)
GFR: 102.31 mL/min (ref >60.00)
GLUCOSE: 167 mg/dL — AB (ref 70–99)
POTASSIUM: 4.6 meq/L (ref 3.5–5.1)
Sodium: 138 mEq/L (ref 135–145)

## 2017-07-16 NOTE — Patient Instructions (Signed)
Please go to the lab today for blood work.  I will call you with your results. We will alter treatment regimen(s) if indicated by your results.   I will check on the Chantix.  Keep an eye on BP measurements. Send me a copy every other week.  If staying stable, we will keep on current regimen and follow-up in 3 months.

## 2017-07-16 NOTE — Progress Notes (Signed)
Patient presents to clinic today for follow-up of hypertension. At last visit, regimen was changed to lisinopril 20-12.5 mg tablet, taking 2 daily. Patient is taking medications as directed. Patient denies chest pain, palpitations, lightheadedness, dizziness, vision changes or frequent headaches.  BP Readings from Last 3 Encounters:  07/16/17 110/68  07/02/17 140/72  12/16/16 140/62   Past Medical History:  Diagnosis Date  . Cervical adenopathy    Steel plate C5-C6  . Environmental allergies   . History of chicken pox   . Hyperglycemia    Borderline Daibetes  . Hypertension   . Increased pressure in the eye   . Measles   . Mumps   . Seasonal allergies     Current Outpatient Medications on File Prior to Visit  Medication Sig Dispense Refill  . albuterol (PROVENTIL HFA;VENTOLIN HFA) 108 (90 Base) MCG/ACT inhaler Inhale 2 puffs into the lungs every 6 (six) hours as needed for wheezing or shortness of breath. 1 Inhaler 3  . atorvastatin (LIPITOR) 10 MG tablet Take 1 tablet (10 mg total) by mouth daily. 30 tablet 1  . Coenzyme Q10 (COQ10) 100 MG CAPS Take 1 capsule by mouth daily. Take along with cholesterol medication to help prevent muscle aches 30 each 0  . lisinopril-hydrochlorothiazide (ZESTORETIC) 20-12.5 MG tablet Take 2 tablets by mouth daily. 180 tablet 0  . CHANTIX STARTING MONTH PAK 0.5 MG X 11 & 1 MG X 42 tablet TAKE AS DIRECTED ON PACKAGE (Patient not taking: Reported on 07/16/2017) 53 tablet 1   No current facility-administered medications on file prior to visit.     No Known Allergies  Family History  Problem Relation Age of Onset  . Brain cancer Mother 18       Deceased  . Liver cancer Mother        Mets  . Anxiety disorder Mother   . Colon cancer Father 71       Deceased  . Alcoholism Father   . Cancer Other        Aunts & Uncles  . Leukemia Sister   . Leukemia Cousin   . Hypertension Brother   . Mental illness Brother     Social History    Socioeconomic History  . Marital status: Legally Separated    Spouse name: None  . Number of children: None  . Years of education: None  . Highest education level: None  Social Needs  . Financial resource strain: None  . Food insecurity - worry: None  . Food insecurity - inability: None  . Transportation needs - medical: None  . Transportation needs - non-medical: None  Occupational History  . None  Tobacco Use  . Smoking status: Former Smoker    Years: 3.00    Types: Cigarettes  . Smokeless tobacco: Never Used  Substance and Sexual Activity  . Alcohol use: Yes    Alcohol/week: 3.0 oz    Types: 5 Standard drinks or equivalent per week    Comment: beer only  . Drug use: No  . Sexual activity: Yes    Partners: Female  Other Topics Concern  . None  Social History Narrative  . None   Review of Systems - See HPI.  All other ROS are negative.  BP 110/68   Pulse 75   Temp 98.1 F (36.7 C) (Oral)   Resp 16   Ht 5\' 8"  (1.727 m)   Wt 251 lb (113.9 kg)   SpO2 94%   BMI 38.16 kg/m  Physical Exam  Constitutional: He is oriented to person, place, and time and well-developed, well-nourished, and in no distress.  HENT:  Head: Normocephalic and atraumatic.  Eyes: Conjunctivae are normal.  Neck: Neck supple.  Cardiovascular: Normal rate, regular rhythm, normal heart sounds and intact distal pulses.  Pulmonary/Chest: Effort normal and breath sounds normal. No respiratory distress. He has no wheezes. He has no rales. He exhibits no tenderness.  Neurological: He is alert and oriented to person, place, and time.  Skin: Skin is warm and dry. No rash noted.  Psychiatric: Affect normal.  Vitals reviewed.   Recent Results (from the past 2160 hour(s))  Basic metabolic panel     Status: Abnormal   Collection Time: 07/02/17 10:24 AM  Result Value Ref Range   Sodium 134 (L) 135 - 145 mEq/L   Potassium 4.1 3.5 - 5.1 mEq/L   Chloride 96 96 - 112 mEq/L   CO2 33 (H) 19 - 32 mEq/L    Glucose, Bld 129 (H) 70 - 99 mg/dL   BUN 11 6 - 23 mg/dL   Creatinine, Ser 0.86 0.40 - 1.50 mg/dL   Calcium 9.4 8.4 - 10.5 mg/dL   GFR 95.49 >60.00 mL/min  Lipid Profile     Status: Abnormal   Collection Time: 07/02/17 10:24 AM  Result Value Ref Range   Cholesterol 189 0 - 200 mg/dL    Comment: ATP III Classification       Desirable:  < 200 mg/dL               Borderline High:  200 - 239 mg/dL          High:  > = 240 mg/dL   Triglycerides 148.0 0.0 - 149.0 mg/dL    Comment: Normal:  <150 mg/dLBorderline High:  150 - 199 mg/dL   HDL 40.50 >39.00 mg/dL   VLDL 29.6 0.0 - 40.0 mg/dL   LDL Cholesterol 119 (H) 0 - 99 mg/dL   Total CHOL/HDL Ratio 5     Comment:                Men          Women1/2 Average Risk     3.4          3.3Average Risk          5.0          4.42X Average Risk          9.6          7.13X Average Risk          15.0          11.0                       NonHDL 148.31     Comment: NOTE:  Non-HDL goal should be 30 mg/dL higher than patient's LDL goal (i.e. LDL goal of < 70 mg/dL, would have non-HDL goal of < 100 mg/dL)  Hemoglobin A1c     Status: None   Collection Time: 07/03/17 12:22 PM  Result Value Ref Range   Hgb A1c MFr Bld 6.5 4.6 - 6.5 %    Comment: Glycemic Control Guidelines for People with Diabetes:Non Diabetic:  <6%Goal of Therapy: <7%Additional Action Suggested:  >8%     Assessment/Plan: 1. Hypertensive heart disease without CHF BP improved. Repeat BMP today. Keep working on diet and exercise regimen. Upload BP measurements to MyChart every 2 weeks. Follow-up 3 months.  -  Basic metabolic panel   Leeanne Rio, PA-C

## 2017-07-17 ENCOUNTER — Ambulatory Visit: Payer: Self-pay | Admitting: *Deleted

## 2017-08-13 ENCOUNTER — Other Ambulatory Visit (INDEPENDENT_AMBULATORY_CARE_PROVIDER_SITE_OTHER): Payer: Managed Care, Other (non HMO)

## 2017-08-13 DIAGNOSIS — E785 Hyperlipidemia, unspecified: Secondary | ICD-10-CM | POA: Diagnosis not present

## 2017-08-13 LAB — HEPATIC FUNCTION PANEL
ALBUMIN: 4.7 g/dL (ref 3.5–5.2)
ALT: 38 U/L (ref 0–53)
AST: 19 U/L (ref 0–37)
Alkaline Phosphatase: 61 U/L (ref 39–117)
BILIRUBIN DIRECT: 0.1 mg/dL (ref 0.0–0.3)
Total Bilirubin: 0.6 mg/dL (ref 0.2–1.2)
Total Protein: 6.8 g/dL (ref 6.0–8.3)

## 2017-08-13 LAB — LIPID PANEL
Cholesterol: 136 mg/dL (ref 0–200)
HDL: 41.4 mg/dL (ref >39.00)
LDL Cholesterol: 77 mg/dL (ref 0–99)
NonHDL: 94.99
TRIGLYCERIDES: 90 mg/dL (ref 0.0–149.0)
Total CHOL/HDL Ratio: 3
VLDL: 18 mg/dL (ref 0.0–40.0)

## 2017-08-31 ENCOUNTER — Encounter: Payer: Self-pay | Admitting: Physician Assistant

## 2017-09-08 ENCOUNTER — Other Ambulatory Visit: Payer: Self-pay | Admitting: Physician Assistant

## 2017-09-08 DIAGNOSIS — E785 Hyperlipidemia, unspecified: Secondary | ICD-10-CM

## 2017-10-15 ENCOUNTER — Other Ambulatory Visit: Payer: Self-pay

## 2017-10-15 ENCOUNTER — Other Ambulatory Visit: Payer: Self-pay | Admitting: Physician Assistant

## 2017-10-15 ENCOUNTER — Ambulatory Visit: Payer: Managed Care, Other (non HMO) | Admitting: Physician Assistant

## 2017-10-15 ENCOUNTER — Encounter: Payer: Self-pay | Admitting: Physician Assistant

## 2017-10-15 VITALS — BP 104/62 | HR 76 | Temp 98.2°F | Resp 14 | Ht 68.0 in | Wt 243.0 lb

## 2017-10-15 DIAGNOSIS — I119 Hypertensive heart disease without heart failure: Secondary | ICD-10-CM | POA: Diagnosis not present

## 2017-10-15 LAB — BASIC METABOLIC PANEL
BUN: 11 mg/dL (ref 6–23)
CHLORIDE: 98 meq/L (ref 96–112)
CO2: 33 meq/L — AB (ref 19–32)
Calcium: 9.3 mg/dL (ref 8.4–10.5)
Creatinine, Ser: 0.76 mg/dL (ref 0.40–1.50)
GFR: 110.03 mL/min (ref >60.00)
GLUCOSE: 145 mg/dL — AB (ref 70–99)
POTASSIUM: 4.5 meq/L (ref 3.5–5.1)
SODIUM: 135 meq/L (ref 135–145)

## 2017-10-15 LAB — MAGNESIUM: Magnesium: 2.1 mg/dL (ref 1.5–2.5)

## 2017-10-15 MED ORDER — LISINOPRIL 20 MG PO TABS: 20.0000 mg | ORAL_TABLET | Freq: Every day | ORAL | 3 refills | Status: DC

## 2017-10-15 NOTE — Patient Instructions (Signed)
Please go to the lab today for blood work.  I will call you with your results. We will alter treatment regimen(s) if indicated by your results.   Please stop the current BP medication. I have sent in the plain Lisinopril 20 mg for you to take daily.  Continue low salt diet. Try to restart your yoga practice. Let me know if cramps are not improving.   Make sure to watch positioning when you are sitting as it is causing some nerve irritation.    DASH Eating Plan DASH stands for "Dietary Approaches to Stop Hypertension." The DASH eating plan is a healthy eating plan that has been shown to reduce high blood pressure (hypertension). It may also reduce your risk for type 2 diabetes, heart disease, and stroke. The DASH eating plan may also help with weight loss. What are tips for following this plan? General guidelines  Avoid eating more than 2,300 mg (milligrams) of salt (sodium) a day. If you have hypertension, you may need to reduce your sodium intake to 1,500 mg a day.  Limit alcohol intake to no more than 1 drink a day for nonpregnant women and 2 drinks a day for men. One drink equals 12 oz of beer, 5 oz of wine, or 1 oz of hard liquor.  Work with your health care provider to maintain a healthy body weight or to lose weight. Ask what an ideal weight is for you.  Get at least 30 minutes of exercise that causes your heart to beat faster (aerobic exercise) most days of the week. Activities may include walking, swimming, or biking.  Work with your health care provider or diet and nutrition specialist (dietitian) to adjust your eating plan to your individual calorie needs. Reading food labels  Check food labels for the amount of sodium per serving. Choose foods with less than 5 percent of the Daily Value of sodium. Generally, foods with less than 300 mg of sodium per serving fit into this eating plan.  To find whole grains, look for the word "whole" as the first word in the ingredient  list. Shopping  Buy products labeled as "low-sodium" or "no salt added."  Buy fresh foods. Avoid canned foods and premade or frozen meals. Cooking  Avoid adding salt when cooking. Use salt-free seasonings or herbs instead of table salt or sea salt. Check with your health care provider or pharmacist before using salt substitutes.  Do not fry foods. Cook foods using healthy methods such as baking, boiling, grilling, and broiling instead.  Cook with heart-healthy oils, such as olive, canola, soybean, or sunflower oil. Meal planning   Eat a balanced diet that includes: ? 5 or more servings of fruits and vegetables each day. At each meal, try to fill half of your plate with fruits and vegetables. ? Up to 6-8 servings of whole grains each day. ? Less than 6 oz of lean meat, poultry, or fish each day. A 3-oz serving of meat is about the same size as a deck of cards. One egg equals 1 oz. ? 2 servings of low-fat dairy each day. ? A serving of nuts, seeds, or beans 5 times each week. ? Heart-healthy fats. Healthy fats called Omega-3 fatty acids are found in foods such as flaxseeds and coldwater fish, like sardines, salmon, and mackerel.  Limit how much you eat of the following: ? Canned or prepackaged foods. ? Food that is high in trans fat, such as fried foods. ? Food that is high in saturated  fat, such as fatty meat. ? Sweets, desserts, sugary drinks, and other foods with added sugar. ? Full-fat dairy products.  Do not salt foods before eating.  Try to eat at least 2 vegetarian meals each week.  Eat more home-cooked food and less restaurant, buffet, and fast food.  When eating at a restaurant, ask that your food be prepared with less salt or no salt, if possible. What foods are recommended? The items listed may not be a complete list. Talk with your dietitian about what dietary choices are best for you. Grains Whole-grain or whole-wheat bread. Whole-grain or whole-wheat pasta. Brown  rice. [place]. Bulgur. Whole-grain and low-sodium cereals. Pita bread. Low-fat, low-sodium crackers. Whole-wheat flour tortillas. Vegetables Fresh or frozen vegetables (raw, steamed, roasted, or grilled). Low-sodium or reduced-sodium tomato and vegetable juice. Low-sodium or reduced-sodium tomato sauce and tomato paste. Low-sodium or reduced-sodium canned vegetables. Fruits All fresh, dried, or frozen fruit. Canned fruit in natural juice (without added sugar). Meat and other protein foods Skinless chicken or [place]. Ground chicken or [place]. Pork with fat trimmed off. Fish and seafood. Egg whites. Dried beans, peas, or lentils. Unsalted nuts, nut butters, and seeds. Unsalted canned beans. Lean cuts of beef with fat trimmed off. Low-sodium, lean deli meat. Dairy Low-fat (1%) or fat-free (skim) milk. Fat-free, low-fat, or reduced-fat cheeses. Nonfat, low-sodium ricotta or cottage cheese. Low-fat or nonfat yogurt. Low-fat, low-sodium cheese. Fats and oils Soft margarine without trans fats. Vegetable oil. Low-fat, reduced-fat, or light mayonnaise and salad dressings (reduced-sodium). Canola, safflower, olive, soybean, and sunflower oils. Avocado. Seasoning and other foods Herbs. Spices. Seasoning mixes without salt. Unsalted popcorn and pretzels. Fat-free sweets. What foods are not recommended? The items listed may not be a complete list. Talk with your dietitian about what dietary choices are best for you. Grains Baked goods made with fat, such as croissants, muffins, or some breads. Dry pasta or rice meal packs. Vegetables Creamed or fried vegetables. Vegetables in a cheese sauce. Regular canned vegetables (not low-sodium or reduced-sodium). Regular canned tomato sauce and paste (not low-sodium or reduced-sodium). Regular tomato and vegetable juice (not low-sodium or reduced-sodium). Angie Fava. Olives. Fruits Canned fruit in a light or heavy syrup. Fried fruit. Fruit in cream or butter  sauce. Meat and other protein foods Fatty cuts of meat. Ribs. Fried meat. Berniece Salines. Sausage. Bologna and other processed lunch meats. Salami. Fatback. Hotdogs. Bratwurst. Salted nuts and seeds. Canned beans with added salt. Canned or smoked fish. Whole eggs or egg yolks. Chicken or [place] with skin. Dairy Whole or 2% milk, cream, and half-and-half. Whole or full-fat cream cheese. Whole-fat or sweetened yogurt. Full-fat cheese. Nondairy creamers. Whipped toppings. Processed cheese and cheese spreads. Fats and oils Butter. Stick margarine. Lard. Shortening. Ghee. Bacon fat. Tropical oils, such as coconut, palm kernel, or palm oil. Seasoning and other foods Salted popcorn and pretzels. Onion salt, garlic salt, seasoned salt, table salt, and sea salt. Worcestershire sauce. Tartar sauce. Barbecue sauce. Teriyaki sauce. Soy sauce, including reduced-sodium. Steak sauce. Canned and packaged gravies. Fish sauce. Oyster sauce. Cocktail sauce. Horseradish that you find on the shelf. Ketchup. Mustard. Meat flavorings and tenderizers. Bouillon cubes. Hot sauce and Tabasco sauce. Premade or packaged marinades. Premade or packaged taco seasonings. Relishes. Regular salad dressings. Where to find more information:  National Heart, Lung, and [place]: https://wilson-eaton.com/  American Heart Association: www.heart.org Summary  The DASH eating plan is a healthy eating plan that has been shown to reduce high blood pressure (hypertension). It may also reduce  your risk for type 2 diabetes, heart disease, and stroke.  With the DASH eating plan, you should limit salt (sodium) intake to 2,300 mg a day. If you have hypertension, you may need to reduce your sodium intake to 1,500 mg a day.  When on the DASH eating plan, aim to eat more fresh fruits and vegetables, whole grains, lean proteins, low-fat dairy, and heart-healthy fats.  Work with your health care provider or diet and nutrition specialist (dietitian) to adjust  your eating plan to your individual calorie needs. This information is not intended to replace advice given to you by your health care provider. Make sure you discuss any questions you have with your health care provider. Document Released: 04/04/2011 Document Revised: 04/08/2016 Document Reviewed: 04/08/2016 Elsevier Interactive Patient Education  [employer].

## 2017-10-15 NOTE — Assessment & Plan Note (Signed)
BP low end of normotensive without medication being taken yet this morning. Is getting orthostatic lightheadedness and cramping. Is working hard on diet and exercise with noted weight loss. Will stop lisinopril-HCTZ and have him just take the 20 mg Lisinopril daily. Follow-up for repeat BP check reviewed. Will check BMP and magnesium today.

## 2017-10-15 NOTE — Progress Notes (Signed)
Patient presents to clinic today for follow-up of hypertension. Patient is currently on a regimen of lisinopril-hctz 20-12.5 mg. Endorses taking as directed. Notes some positional lightheadedness and cramping. Recent BMP shows normal electrolytes. He notes he is hydrating very well and trying to watch his diet. Has lost some weight since last visit. Patient denies chest pain, palpitations, dizziness, vision changes or frequent headaches.  BP Readings from Last 3 Encounters:  10/15/17 104/62  07/16/17 110/68  07/02/17 140/72    Past Medical History:  Diagnosis Date  . Cervical adenopathy    Steel plate C5-C6  . Environmental allergies   . History of chicken pox   . Hyperglycemia    Borderline Daibetes  . Hypertension   . Increased pressure in the eye   . Measles   . Mumps   . Seasonal allergies     Current Outpatient Medications on File Prior to Visit  Medication Sig Dispense Refill  . albuterol (PROVENTIL HFA;VENTOLIN HFA) 108 (90 Base) MCG/ACT inhaler Inhale 2 puffs into the lungs every 6 (six) hours as needed for wheezing or shortness of breath. 1 Inhaler 3  . atorvastatin (LIPITOR) 10 MG tablet TAKE 1 TABLET BY MOUTH EVERY DAY 30 tablet 1  . Coenzyme Q10 (COQ10) 100 MG CAPS Take 1 capsule by mouth daily. Take along with cholesterol medication to help prevent muscle aches 30 each 0   No current facility-administered medications on file prior to visit.     No Known Allergies  Family History  Problem Relation Age of Onset  . Brain cancer Mother 78       Deceased  . Liver cancer Mother        Mets  . Anxiety disorder Mother   . Colon cancer Father 78       Deceased  . Alcoholism Father   . Cancer Other        Aunts & Uncles  . Leukemia Sister   . Leukemia Cousin   . Hypertension Brother   . Mental illness Brother     Social History   Socioeconomic History  . Marital status: Legally Separated    Spouse name: Not on file  . Number of children: Not on file  .  Years of education: Not on file  . Highest education level: Not on file  Occupational History  . Not on file  Social Needs  . Financial resource strain: Not on file  . Food insecurity:    Worry: Not on file    Inability: Not on file  . Transportation needs:    Medical: Not on file    Non-medical: Not on file  Tobacco Use  . Smoking status: Former Smoker    Years: 3.00    Types: Cigarettes  . Smokeless tobacco: Never Used  Substance and Sexual Activity  . Alcohol use: Yes    Alcohol/week: 3.0 oz    Types: 5 Standard drinks or equivalent per week    Comment: beer only  . Drug use: No  . Sexual activity: Yes    Partners: Female  Lifestyle  . Physical activity:    Days per week: Not on file    Minutes per session: Not on file  . Stress: Not on file  Relationships  . Social connections:    Talks on phone: Not on file    Gets together: Not on file    Attends religious service: Not on file    Active member of club or organization: Not on file  Attends meetings of clubs or organizations: Not on file    Relationship status: Not on file  Other Topics Concern  . Not on file  Social History Narrative  . Not on file   Review of Systems - See HPI.  All other ROS are negative.  BP 104/62   Pulse 76   Temp 98.2 F (36.8 C) (Oral)   Resp 14   Ht 5\' 8"  (1.727 m)   Wt 243 lb (110.2 kg)   SpO2 96%   BMI 36.95 kg/m   Physical Exam  Constitutional: He is oriented to person, place, and time. He appears well-developed and well-nourished.  HENT:  Head: Normocephalic and atraumatic.  Eyes: Conjunctivae are normal.  Cardiovascular: Normal rate, regular rhythm, normal heart sounds and intact distal pulses.  Pulmonary/Chest: Effort normal and breath sounds normal.  Neurological: He is alert and oriented to person, place, and time.  Psychiatric: He has a normal mood and affect.  Vitals reviewed.   Recent Results (from the past 2160 hour(s))  Hepatic function panel     Status:  None   Collection Time: 08/13/17  8:07 AM  Result Value Ref Range   Total Bilirubin 0.6 0.2 - 1.2 mg/dL   Bilirubin, Direct 0.1 0.0 - 0.3 mg/dL   Alkaline Phosphatase 61 39 - 117 U/L   AST 19 0 - 37 U/L   ALT 38 0 - 53 U/L   Total Protein 6.8 6.0 - 8.3 g/dL   Albumin 4.7 3.5 - 5.2 g/dL  Lipid panel     Status: None   Collection Time: 08/13/17  8:07 AM  Result Value Ref Range   Cholesterol 136 0 - 200 mg/dL    Comment: ATP III Classification       Desirable:  < 200 mg/dL               Borderline High:  200 - 239 mg/dL          High:  > = 240 mg/dL   Triglycerides 90.0 0.0 - 149.0 mg/dL    Comment: Normal:  <150 mg/dLBorderline High:  150 - 199 mg/dL   HDL 41.40 >39.00 mg/dL   VLDL 18.0 0.0 - 40.0 mg/dL   LDL Cholesterol 77 0 - 99 mg/dL   Total CHOL/HDL Ratio 3     Comment:                Men          Women1/2 Average Risk     3.4          3.3Average Risk          5.0          4.42X Average Risk          9.6          7.13X Average Risk          15.0          11.0                       NonHDL 94.99     Comment: NOTE:  Non-HDL goal should be 30 mg/dL higher than patient's LDL goal (i.e. LDL goal of < 70 mg/dL, would have non-HDL goal of < 100 mg/dL)   Assessment/Plan: Hypertensive heart disease without CHF BP low end of normotensive without medication being taken yet this morning. Is getting orthostatic lightheadedness and cramping. Is working hard on diet and exercise with noted  weight loss. Will stop lisinopril-HCTZ and have him just take the 20 mg Lisinopril daily. Follow-up for repeat BP check reviewed. Will check BMP and magnesium today.     Leeanne Rio, PA-C

## 2017-10-17 ENCOUNTER — Other Ambulatory Visit: Payer: Self-pay | Admitting: Emergency Medicine

## 2017-10-17 DIAGNOSIS — I119 Hypertensive heart disease without heart failure: Secondary | ICD-10-CM

## 2017-10-17 DIAGNOSIS — E785 Hyperlipidemia, unspecified: Secondary | ICD-10-CM

## 2017-10-17 MED ORDER — LISINOPRIL 20 MG PO TABS: 20.0000 mg | ORAL_TABLET | Freq: Every day | ORAL | 1 refills | Status: DC

## 2017-10-17 MED ORDER — ATORVASTATIN CALCIUM 10 MG PO TABS: 10.0000 mg | ORAL_TABLET | Freq: Every day | ORAL | 1 refills | Status: DC

## 2017-11-10 ENCOUNTER — Other Ambulatory Visit: Payer: Self-pay | Admitting: Physician Assistant

## 2017-11-10 DIAGNOSIS — E785 Hyperlipidemia, unspecified: Secondary | ICD-10-CM

## 2017-12-15 ENCOUNTER — Telehealth: Payer: Self-pay | Admitting: Physician Assistant

## 2017-12-15 NOTE — Telephone Encounter (Unsigned)
Copied from [place] [phone]. Topic: Quick Communication - Rx Refill/Question >> Dec 15, 2017 12:03 PM Scott Jensen wrote: Medication: lisinopril (PRINIVIL,ZESTRIL) 20 MG tablet [[id number]]  Has the patient contacted their pharmacy?Yes  (Agent: If no, request that the patient contact the pharmacy for the refill.) (Agent: If yes, when and what did the pharmacy advise?)  Preferred Pharmacy (with phone number or street name): [facility].  Per pt he stated it was called in incorrectly the month before.  He thinks it suppose to be 2 tabs daily   Agent: Please be advised that RX refills may take up to 3 business days. We ask that you follow-up with your pharmacy.

## 2017-12-15 NOTE — Telephone Encounter (Signed)
Spoke with pt regarding correct dosage of Lisinopril. Current prescription was written for Lisinopril 20 mg daily which was also noted in OV notes from 6/19 with Cody,PA. Pt states he has been taking 2-20 mg tabs daily since June because he saw that on his last prescription bottle of Lisinopril-HCTZ he was taking 2 tabs daily. Pt states that his SBP has been ranging between the 120s-140s. Advised pt that he should only be taking 1- 20 mg tab of Lisinopril and that Cody,PA would be notified for confirmation. Pt also notes that even though he was given a 90 day prescription of Lisinopril he has been taking 2 tabs since June and he may run out of medication sooner than 90 days. Pt advised to contact [facility] for refill of medication and to have the pharmacy to contact the office if any issue arises regarding refill. Pt verbalized understanding.

## 2018-02-22 IMAGING — DX DG CHEST 1V PORT
1 series · 2 of 2 positions shown · non-contrast
Comparison: Chest radiograph and CTA of the chest performed
04/22/2016

CLINICAL DATA: Acute onset of left upper anterior chest pain and
cough. Tachycardia. Initial encounter.

EXAM:
PORTABLE CHEST 1 VIEW

[Series 1: chest ap · 0.14mm/px · 2 of 2 slices shown]
[im 1/2]
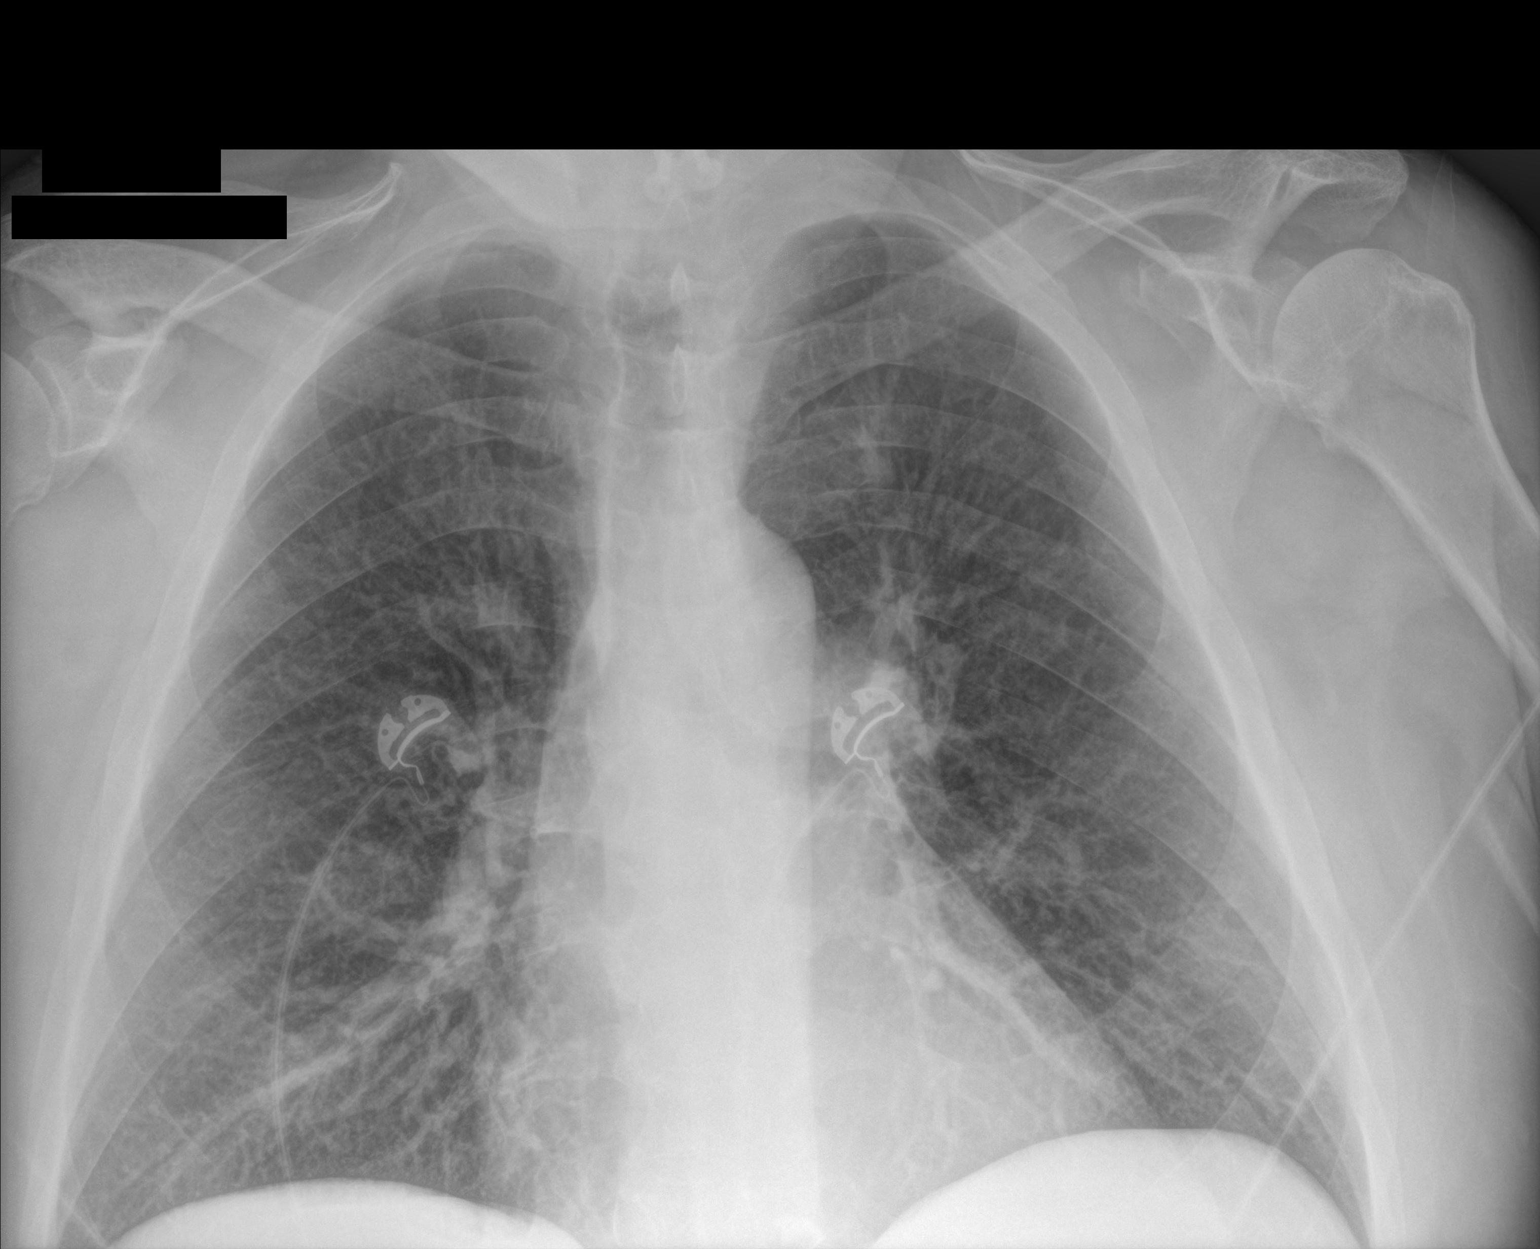
[im 2/2]
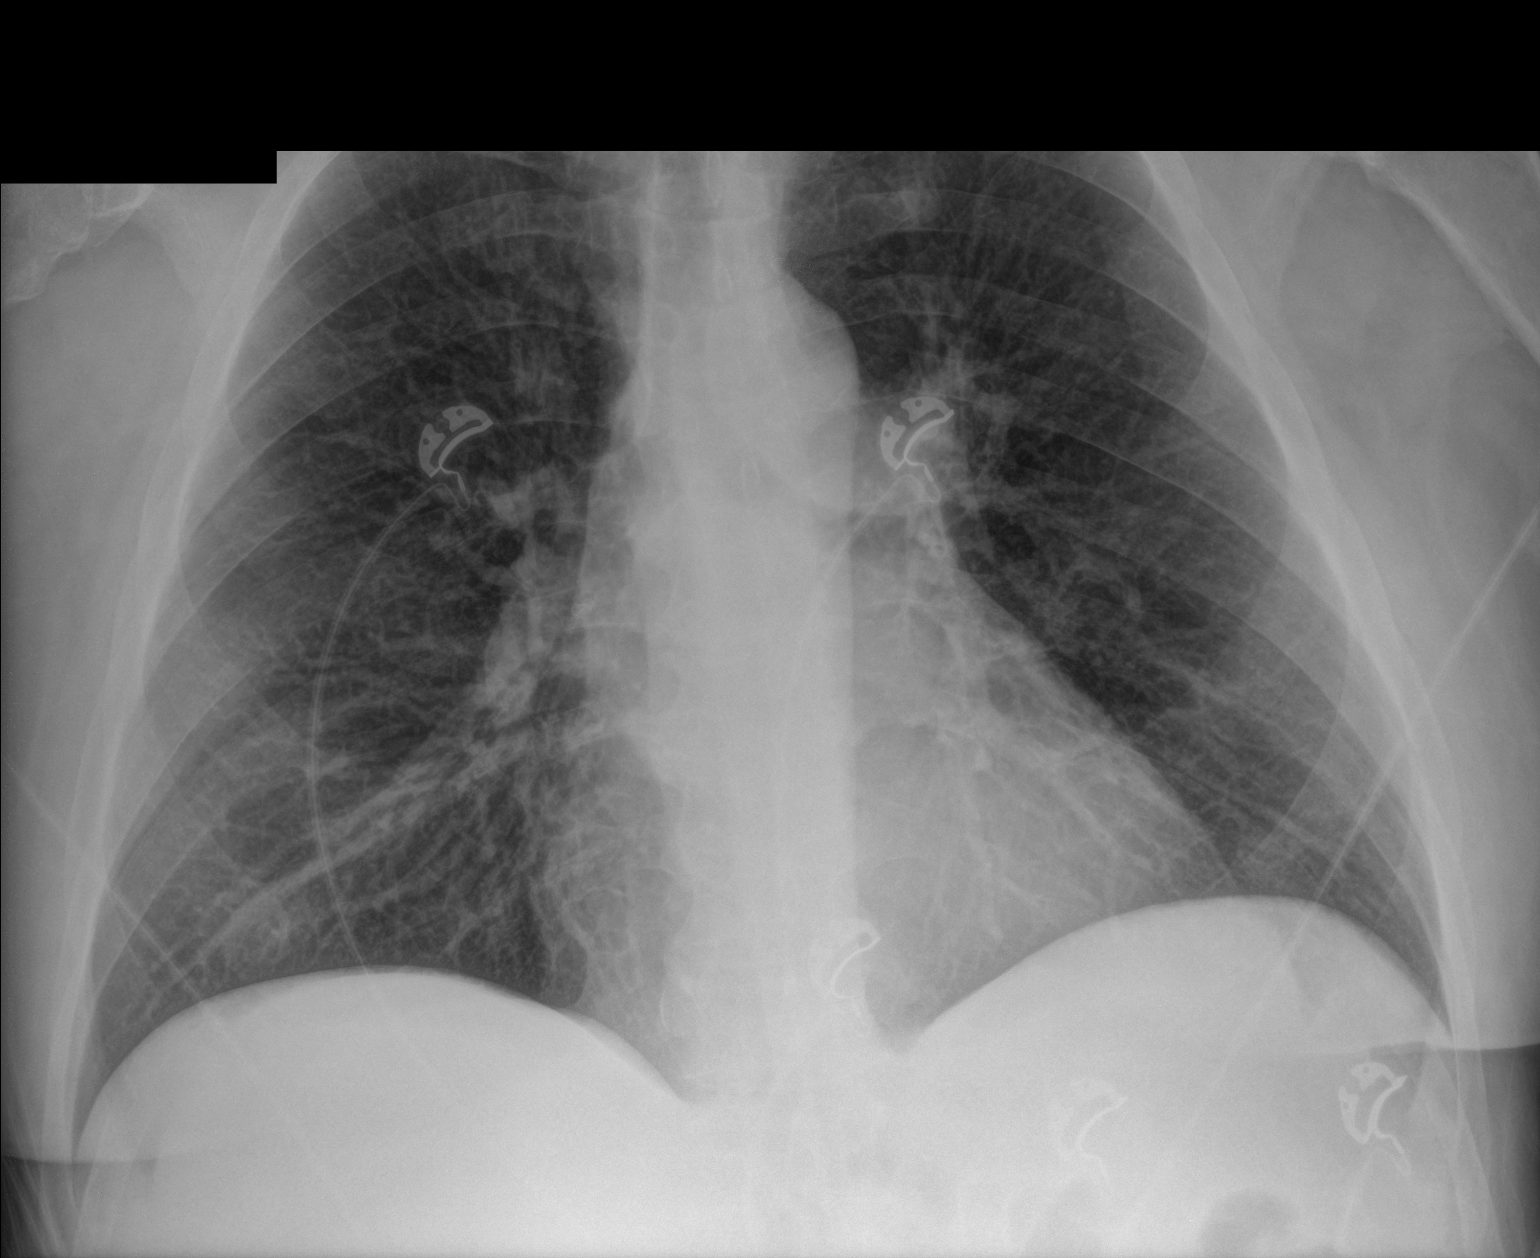

[2 of 2 positions shown; findings below may reference images not displayed]

FINDINGS: The lungs are well-aerated. Mild vascular congestion is noted. There
is no evidence of focal opacification, pleural effusion or
pneumothorax.

The cardiomediastinal silhouette is within normal limits. No acute
osseous abnormalities are seen.
IMPRESSION: Mild vascular congestion noted.  Lungs remain grossly clear.

## 2018-03-13 ENCOUNTER — Other Ambulatory Visit: Payer: Self-pay | Admitting: Physician Assistant

## 2018-03-13 DIAGNOSIS — I119 Hypertensive heart disease without heart failure: Secondary | ICD-10-CM

## 2018-07-11 ENCOUNTER — Other Ambulatory Visit: Payer: Self-pay | Admitting: Physician Assistant

## 2018-07-11 DIAGNOSIS — Z720 Tobacco use: Secondary | ICD-10-CM

## 2018-07-11 DIAGNOSIS — I119 Hypertensive heart disease without heart failure: Secondary | ICD-10-CM

## 2018-11-06 DIAGNOSIS — J432 Centrilobular emphysema: Secondary | ICD-10-CM | POA: Insufficient documentation

## 2018-11-06 HISTORY — DX: Centrilobular emphysema: J43.2

## 2019-12-24 ENCOUNTER — Other Ambulatory Visit: Payer: Self-pay

## 2019-12-24 ENCOUNTER — Other Ambulatory Visit: Payer: Self-pay | Admitting: Physician Assistant

## 2019-12-24 ENCOUNTER — Encounter: Payer: Self-pay | Admitting: Physician Assistant

## 2019-12-24 ENCOUNTER — Ambulatory Visit (HOSPITAL_BASED_OUTPATIENT_CLINIC_OR_DEPARTMENT_OTHER)
Admission: RE | Admit: 2019-12-24 | Discharge: 2019-12-24 | Disposition: A | Discharge status: Home / Self Care | Payer: Medicare Other | Source: Ambulatory Visit | Attending: Physician Assistant | Admitting: Physician Assistant

## 2019-12-24 ENCOUNTER — Telehealth (INDEPENDENT_AMBULATORY_CARE_PROVIDER_SITE_OTHER): Payer: Medicare Other | Admitting: Physician Assistant

## 2019-12-24 DIAGNOSIS — Z720 Tobacco use: Secondary | ICD-10-CM

## 2019-12-24 DIAGNOSIS — G4733 Obstructive sleep apnea (adult) (pediatric): Secondary | ICD-10-CM

## 2019-12-24 DIAGNOSIS — M7989 Other specified soft tissue disorders: Secondary | ICD-10-CM | POA: Insufficient documentation

## 2019-12-24 DIAGNOSIS — J441 Chronic obstructive pulmonary disease with (acute) exacerbation: Secondary | ICD-10-CM | POA: Diagnosis not present

## 2019-12-24 DIAGNOSIS — L03116 Cellulitis of left lower limb: Secondary | ICD-10-CM

## 2019-12-24 DIAGNOSIS — I119 Hypertensive heart disease without heart failure: Secondary | ICD-10-CM | POA: Diagnosis not present

## 2019-12-24 DIAGNOSIS — J432 Centrilobular emphysema: Secondary | ICD-10-CM | POA: Diagnosis not present

## 2019-12-24 DIAGNOSIS — E785 Hyperlipidemia, unspecified: Secondary | ICD-10-CM

## 2019-12-24 MED ORDER — FUROSEMIDE 20 MG PO TABS: 20.0000 mg | ORAL_TABLET | Freq: Every day | ORAL | 0 refills | Status: DC

## 2019-12-24 MED ORDER — ALBUTEROL SULFATE HFA 108 (90 BASE) MCG/ACT IN AERS: 1.0000 | INHALATION_SPRAY | Freq: Four times a day (QID) | RESPIRATORY_TRACT | 6 refills | Status: AC | PRN

## 2019-12-24 MED ORDER — TELMISARTAN 40 MG PO TABS: 40.0000 mg | ORAL_TABLET | Freq: Every day | ORAL | 1 refills | Status: DC

## 2019-12-24 MED ORDER — FLUTICASONE-SALMETEROL 250-50 MCG/DOSE IN AEPB: 1.0000 | INHALATION_SPRAY | Freq: Two times a day (BID) | RESPIRATORY_TRACT | 3 refills | Status: AC

## 2019-12-24 MED ORDER — FLUTICASONE-SALMETEROL 250-50 MCG/DOSE IN AEPB: 1.0000 | INHALATION_SPRAY | Freq: Two times a day (BID) | RESPIRATORY_TRACT | 3 refills | Status: DC

## 2019-12-24 MED ORDER — ALBUTEROL SULFATE HFA 108 (90 BASE) MCG/ACT IN AERS: 1.0000 | INHALATION_SPRAY | Freq: Four times a day (QID) | RESPIRATORY_TRACT | 6 refills | Status: DC | PRN

## 2019-12-24 MED ORDER — CEPHALEXIN 500 MG PO CAPS: 500.0000 mg | ORAL_CAPSULE | Freq: Two times a day (BID) | ORAL | 0 refills | Status: AC

## 2019-12-24 NOTE — Patient Instructions (Signed)
Please restart your BP medication (Telmisartan) daily.  Increase fluids. Limit salt intake.   Start the Advair as directed.  The Albuterol is to use as directed if needed for breakthrough chest tightness.  I am getting you back in with our Pulmonology group ASAP for further management.

## 2019-12-24 NOTE — Progress Notes (Signed)
I have discussed the procedure for the virtual visit with the patient who has given consent to proceed with assessment and treatment.   Kinzee Happel S Jomari Bartnik, CMA     

## 2019-12-24 NOTE — Progress Notes (Signed)
Virtual Visit via Video   I connected with patient on 12/24/19 at  2:00 PM EDT by a video enabled telemedicine application and verified that I am speaking with the correct person using two identifiers.  Location patient: Home Location provider: [facility], Office Persons participating in the virtual visit: Patient, Provider, [place] (Patina Moore)  I discussed the limitations of evaluation and management by telemedicine and the availability of in person appointments. The patient expressed understanding and agreed to proceed.  Subjective:   HPI:   Patient presents via [place] today to re-establish care. Patient has been followed by Primary Care with [facility] since 2020 due to their location being easier for him. Records available in [place].  Patient endorses 2 weeks of left lower leg swelling now with redness and tenderness.  Denies any swelling of right leg.  Denies recent surgical procedure or long distance traveling.  Denies any chest pain.  Denies any increased shortness of breath of above his baseline for COPD over the past year.  Hypertension --history of hypertension, noting to most recently being on a regimen of Micardis 40 mg daily.  Has been out of medication since February. Patient denies chest pain, palpitations, lightheadedness, dizziness, vision changes or frequent headaches. Was previously a heavy tobacco smoker but has completely quit since last year.  Tries to keep well-hydrated.  No regular exercise.  CAD --patient previously on statin medication but states he has been off of this since seeing different provider.  Is not sure why he was taken off of this.  Will obtain records to review to see if there was any contraindication to statin medication.  If not we will restart.  COPD -- Longstanding history but was doing well overall. Has stopped smoking.  Notes he was hospitalized with Covid in spring of last year.  States since that time he has had quite a bit  of an issue with his breathing even despite stopping smoking.  Notes chest tightness and windedness with wheezing on a daily basis.  States his oxygen level will stay 89-93% on a regular basis.  Has obstructive sleep apnea and has been wearing CPAP at night. Still notes O2 dropping to 89 with CPAP.  Was recently seen by his pulmonologist through [facility].  States no changes were made.  He is only on albuterol inhaler as needed but has ran out.  Has not been on a maintenance inhaler.  Diabetes --history of diet-controlled type 2 diabetes.  On reviewing care everywhere since last A1c was in December and A1c was at 6.5.  Denies any vision changes or numbness tingling in hands or feet.  ROS:   See pertinent positives and negatives per HPI.  Patient Active Problem List   Diagnosis Date Noted  . Centrilobular emphysema ([place]) 11/06/2018  . CAD (coronary artery disease), native coronary artery 12/07/2016  . OSA (obstructive sleep apnea) 12/07/2016  . Tachycardia 12/07/2016  . COPD with acute exacerbation ([place]) 12/06/2016  . Hyperlipidemia 11/05/2016  . Hypertensive heart disease without CHF 06/07/2015  . Cardiomegaly 06/07/2015  . Lipoma of arm 05/31/2015  . Tobacco abuse disorder 05/22/2014    Social History   Tobacco Use  . Smoking status: Former Smoker    Years: 3.00    Types: Cigarettes  . Smokeless tobacco: Never Used  Substance Use Topics  . Alcohol use: Yes    Alcohol/week: 5.0 standard drinks    Types: 5 Standard drinks or equivalent per week    Comment: beer only  Current Outpatient Medications:  .  albuterol (PROVENTIL) (2.5 MG/3ML) 0.083% nebulizer solution, Inhale into the lungs., Disp: , Rfl:  .  Albuterol Sulfate (PROAIR RESPICLICK) 539 (90 Base) MCG/ACT AEPB, Inhale 2 puffs into the lungs every 6 (six) hours as needed. For shortness of breath and wheezing, Disp: 1 each, Rfl: 6 .  telmisartan (MICARDIS) 40 MG tablet, Take 40 mg by mouth daily., Disp: , Rfl:  .   Coenzyme Q10 (COQ10) 100 MG CAPS, Take 1 capsule by mouth daily. Take along with cholesterol medication to help prevent muscle aches (Patient not taking: Reported on 12/24/2019), Disp: 30 each, Rfl: 0  No Known Allergies  Objective:   There were no vitals taken for this visit.  Patient is well-developed, well-nourished in no acute distress.  Resting comfortably at home.  Head is normocephalic, atraumatic.  No labored breathing.  Speech is clear and coherent with logical content.  Patient is alert and oriented at baseline.  Left leg noted to be quite swollen especially compared to right leg.  There is evidence of significant distal lower erythema and warmth, concerning for cellulitis.  Unable to assess Bevelyn Buckles' sign.  Assessment and Plan:   1. COPD with acute exacerbation ([place]) 2. Centrilobular emphysema ([place]) Referral placed back to pulmonology for further evaluation and PFTs.  Very mild exacerbation at present.  Is not short of breath while talking.  Did go outside to assess patient as he was doing video visit being on a parking lot.  O2 saturations are 94%.  Patient restarted on Advair.  Albuterol inhaler sent to pharmacy.  Strict ER precautions reviewed with patient. - Ambulatory referral to Pulmonology  3. OSA (obstructive sleep apnea) Longstanding history.  Needs follow-up with pulmonology. - Ambulatory referral to Pulmonology  4. Hypertensive heart disease without CHF 5. Hyperlipidemia, unspecified hyperlipidemia type Prior history.  Has been out of his blood pressure medication since February with elevated blood pressure measurements.  Will restart telmisartan 40 mg daily.  DASH diet discussed.  Strict ER precautions reviewed.  Close in office follow-up scheduled for next week.  6. Left leg swelling Recent onset.  Unilateral and left.  Significant swelling noted with erythema.  Concern for clot and cellulitis.  Stat ultrasound will be obtained today to rule out DVT.  Will alter  treatment based on these findings.  Rx Keflex 500 mg twice daily x7 days. - US Venous Img Lower Unilateral Left; Future  7. Cellulitis of left lower leg Rx Keflex 500 mg twice daily x7 days.  Ultrasound to rule out DVT today as noted above.  Supportive measures reviewed with patient.  8. Tobacco abuse disorder Patient has successfully quit smoking.  This is great news and he is to be commended.  We will monitor.   Leeanne Rio, [place] 12/24/2019

## 2019-12-27 ENCOUNTER — Encounter: Payer: Self-pay | Admitting: Physician Assistant

## 2019-12-29 ENCOUNTER — Encounter: Payer: Self-pay | Admitting: Physician Assistant

## 2019-12-31 ENCOUNTER — Ambulatory Visit (INDEPENDENT_AMBULATORY_CARE_PROVIDER_SITE_OTHER): Payer: Medicare Other | Admitting: Physician Assistant

## 2019-12-31 ENCOUNTER — Encounter: Payer: Self-pay | Admitting: Physician Assistant

## 2019-12-31 ENCOUNTER — Other Ambulatory Visit: Payer: Self-pay

## 2019-12-31 VITALS — BP 140/70 | HR 86 | Temp 98.1°F | Resp 16 | Ht 68.0 in | Wt 251.0 lb

## 2019-12-31 DIAGNOSIS — I119 Hypertensive heart disease without heart failure: Secondary | ICD-10-CM | POA: Diagnosis not present

## 2019-12-31 DIAGNOSIS — J432 Centrilobular emphysema: Secondary | ICD-10-CM

## 2019-12-31 DIAGNOSIS — R0602 Shortness of breath: Secondary | ICD-10-CM

## 2019-12-31 DIAGNOSIS — E119 Type 2 diabetes mellitus without complications: Secondary | ICD-10-CM

## 2019-12-31 DIAGNOSIS — R609 Edema, unspecified: Secondary | ICD-10-CM | POA: Diagnosis not present

## 2019-12-31 DIAGNOSIS — J441 Chronic obstructive pulmonary disease with (acute) exacerbation: Secondary | ICD-10-CM

## 2019-12-31 DIAGNOSIS — L03116 Cellulitis of left lower limb: Secondary | ICD-10-CM

## 2019-12-31 LAB — CBC WITH DIFFERENTIAL/PLATELET
Basophils Absolute: 0.1 10*3/uL (ref 0.0–0.1)
Basophils Relative: 1.1 % (ref 0.0–3.0)
Eosinophils Absolute: 0.1 10*3/uL (ref 0.0–0.7)
Eosinophils Relative: 1.9 % (ref 0.0–5.0)
HCT: 51.6 % (ref 39.0–52.0)
Hemoglobin: 17.1 g/dL — ABNORMAL HIGH (ref 13.0–17.0)
Lymphocytes Relative: 18.9 % (ref 12.0–46.0)
Lymphs Abs: 1.1 10*3/uL (ref 0.7–4.0)
MCHC: 33.2 g/dL (ref 30.0–36.0)
MCV: 97.3 fl (ref 78.0–100.0)
Monocytes Absolute: 0.7 10*3/uL (ref 0.1–1.0)
Monocytes Relative: 11.5 % (ref 3.0–12.0)
Neutro Abs: 3.9 10*3/uL (ref 1.4–7.7)
Neutrophils Relative %: 66.6 % (ref 43.0–77.0)
Platelets: 139 10*3/uL — ABNORMAL LOW (ref 150.0–400.0)
RBC: 5.31 Mil/uL (ref 4.22–5.81)
RDW: 14.6 % (ref 11.5–15.5)
WBC: 5.8 10*3/uL (ref 4.0–10.5)

## 2019-12-31 LAB — COMPREHENSIVE METABOLIC PANEL
ALT: 32 U/L (ref 0–53)
AST: 26 U/L (ref 0–37)
Albumin: 4.3 g/dL (ref 3.5–5.2)
Alkaline Phosphatase: 77 U/L (ref 39–117)
BUN: 14 mg/dL (ref 6–23)
CO2: 33 mEq/L — ABNORMAL HIGH (ref 19–32)
Calcium: 8.9 mg/dL (ref 8.4–10.5)
Chloride: 95 mEq/L — ABNORMAL LOW (ref 96–112)
Creatinine, Ser: 0.71 mg/dL (ref 0.40–1.50)
GFR: 111.2 mL/min (ref >60.00)
Glucose, Bld: 111 mg/dL — ABNORMAL HIGH (ref 70–99)
Potassium: 5 mEq/L (ref 3.5–5.1)
Sodium: 133 mEq/L — ABNORMAL LOW (ref 135–145)
Total Bilirubin: 0.8 mg/dL (ref 0.2–1.2)
Total Protein: 6.4 g/dL (ref 6.0–8.3)

## 2019-12-31 LAB — LIPID PANEL
Cholesterol: 122 mg/dL (ref 0–200)
HDL: 42 mg/dL (ref >39.00)
LDL Cholesterol: 64 mg/dL (ref 0–99)
NonHDL: 79.56
Total CHOL/HDL Ratio: 3
Triglycerides: 80 mg/dL (ref 0.0–149.0)
VLDL: 16 mg/dL (ref 0.0–40.0)

## 2019-12-31 LAB — BRAIN NATRIURETIC PEPTIDE: Pro B Natriuretic peptide (BNP): 151 pg/mL — ABNORMAL HIGH (ref 0.0–100.0)

## 2019-12-31 LAB — HEMOGLOBIN A1C: Hgb A1c MFr Bld: 6.8 % — ABNORMAL HIGH (ref 4.6–6.5)

## 2019-12-31 NOTE — Patient Instructions (Addendum)
Please go to the lab today for blood work.  I will call you with your results. We will alter treatment regimen(s) if indicated by your results.   You will be contacted by Pulmonology as previously discussed.  You will also be contacted to schedule an echocardiogram.   I am increasing your Micardis to 80 mg daily. A new prescription has been sent in.  For now take 2 of your 40 mg tablets daily.  Keep a low salt diet.  I am increasing your Lasix to 20 mg twice daily over the weekend to help further with swelling.   Continue chronic medications as directed.   Call me Monday to let me know how the swelling is.

## 2019-12-31 NOTE — Progress Notes (Signed)
Patient presents to clinic today for follow-up regarding lower extremity swelling and cellulitis of LLE. Patient has negative [place] for DVT of LLE. Was started on diuretic and Keflex. Patient endorses taking antibiotic as directed with 1 tablet left. Notes redness and pain is significantly improved, mostly resolved. Still having some swelling despite fluid pill although he notes was improved. Denies any chest pain or SOB. Breathing at baseline.   Past Medical History:  Diagnosis Date  . Cervical adenopathy    Steel plate C5-C6  . Environmental allergies   . History of chicken pox   . Hyperglycemia    Borderline Daibetes  . Hypertension   . Increased pressure in the eye   . Measles   . Mumps   . Seasonal allergies     Current Outpatient Medications on File Prior to Visit  Medication Sig Dispense Refill  . albuterol (VENTOLIN HFA) 108 (90 Base) MCG/ACT inhaler Inhale 1-2 puffs into the lungs every 6 (six) hours as needed for wheezing or shortness of breath. 18 each 6  . cephALEXin (KEFLEX) 500 MG capsule Take 1 capsule (500 mg total) by mouth 2 (two) times daily for 7 days. 14 capsule 0  . Coenzyme Q10 (COQ10) 100 MG CAPS Take 1 capsule by mouth daily. Take along with cholesterol medication to help prevent muscle aches 30 each 0  . Fluticasone-Salmeterol (ADVAIR DISKUS) 250-50 MCG/DOSE AEPB Inhale 1 puff into the lungs 2 (two) times daily. 1 each 3  . furosemide (LASIX) 20 MG tablet Take 1 tablet (20 mg total) by mouth daily. 10 tablet 0  . telmisartan (MICARDIS) 40 MG tablet Take 1 tablet (40 mg total) by mouth daily. 30 tablet 1   No current facility-administered medications on file prior to visit.    No Known Allergies  Family History  Problem Relation Age of Onset  . Brain cancer Mother 57       Deceased  . Liver cancer Mother        Mets  . Anxiety disorder Mother   . Colon cancer Father 81       Deceased  . Alcoholism Father   . Cancer Other        Aunts & Uncles  .  Leukemia Sister   . Leukemia Cousin   . Hypertension Brother   . Mental illness Brother     Social History   Socioeconomic History  . Marital status: Divorced    Spouse name: Not on file  . Number of children: Not on file  . Years of education: Not on file  . Highest education level: Not on file  Occupational History  . Not on file  Tobacco Use  . Smoking status: Former Smoker    Years: 3.00    Types: Cigarettes  . Smokeless tobacco: Never Used  Vaping Use  . Vaping Use: Never used  Substance and Sexual Activity  . Alcohol use: Yes    Alcohol/week: 5.0 standard drinks    Types: 5 Standard drinks or equivalent per week    Comment: beer only  . Drug use: No  . Sexual activity: Yes    Partners: Female  Other Topics Concern  . Not on file  Social History Narrative  . Not on file   Social Determinants of Health   Financial Resource Strain:   . Difficulty of Paying Living Expenses: Not on file  Food Insecurity:   . Worried About Charity fundraiser in the Last Year: Not on file  .  Ran Out of Food in the Last Year: Not on file  Transportation Needs:   . Lack of Transportation (Medical): Not on file  . Lack of Transportation (Non-Medical): Not on file  Physical Activity:   . Days of Exercise per Week: Not on file  . Minutes of Exercise per Session: Not on file  Stress:   . Feeling of Stress : Not on file  Social Connections:   . Frequency of Communication with Friends and Family: Not on file  . Frequency of Social Gatherings with Friends and Family: Not on file  . Attends Religious Services: Not on file  . Active Member of Clubs or Organizations: Not on file  . Attends Archivist Meetings: Not on file  . Marital Status: Not on file   Review of Systems - See HPI.  All other ROS are negative.  Wt 251 lb (113.9 kg)   BMI 38.16 kg/m   Physical Exam Vitals reviewed.  Constitutional:      Appearance: Normal appearance.  HENT:     Head: Normocephalic  and atraumatic.  Cardiovascular:     Rate and Rhythm: Normal rate and regular rhythm.     Pulses: Normal pulses.          Dorsalis pedis pulses are 2+ on the right side and 2+ on the left side.       Posterior tibial pulses are 2+ on the right side and 2+ on the left side.     Heart sounds: Normal heart sounds.     Comments: LLE without continued induration or tenderness. Erythema mostly resolved -- some postinflammatory changes still present. No sign of ulceration. LLE swelling still slightly > RLE.  Pulmonary:     Effort: Pulmonary effort is normal.     Breath sounds: Normal breath sounds.  Musculoskeletal:     Cervical back: Neck supple.     Right lower leg: 1+ Edema present.     Left lower leg: 1+ Edema present.  Neurological:     Mental Status: He is alert.  Psychiatric:        Mood and Affect: Mood normal.      Assessment/Plan: 1. Hypertensive heart disease without known CHF BP improved from previous check. Giving peripheral edema and SOBOE noted by patient will check BNP and obtain echo. Will check fasting labs today including Lipids, CMP and A1C to further risk stratify.  - Comp Met (CMET) - B Nat Peptide - Lipid Profile - Hemoglobin A1c - ECHOCARDIOGRAM COMPLETE; Future  2. COPD with acute exacerbation ([place]) 3. Centrilobular emphysema ([facility]) Exacerbation resolved. Lungs CTAB today. Appt with Pulmonology pending. Continue current regimen.   4. Cellulitis of left lower leg Secondary to edema. Resolved. Finish entire course of antibiotic.  - CBC w/Diff  5. Peripheral edema 6. SOBOE (shortness of breath on exertion) [place] LLE negative for DVT. Some improvement with furosemide. Will increase to BID. Repeat labs today. Will also check BNP and obtaine echocardiogram. - B Nat Peptide - ECHOCARDIOGRAM COMPLETE; Future  7. Controlled type 2 diabetes mellitus without complication, without long-term current use of insulin ([place]) Diagnosed when seeing another provider. Slightly  diabetic per patient. Will repeat A1C to further assess.  - Hemoglobin A1c  This visit occurred during the SARS-CoV-2 public health emergency.  Safety protocols were in place, including screening questions prior to the visit, additional usage of staff PPE, and extensive cleaning of exam room while observing appropriate contact time as indicated for disinfecting solutions.  Zora Glendenning Cody Lameshia Hypolite, PA-C 

## 2020-01-02 ENCOUNTER — Other Ambulatory Visit: Payer: Self-pay | Admitting: Physician Assistant

## 2020-01-03 ENCOUNTER — Encounter: Payer: Self-pay | Admitting: Physician Assistant

## 2020-01-03 DIAGNOSIS — M7989 Other specified soft tissue disorders: Secondary | ICD-10-CM

## 2020-01-04 ENCOUNTER — Encounter: Payer: Self-pay | Admitting: Physician Assistant

## 2020-01-04 NOTE — Telephone Encounter (Signed)
Medication not on patient's current medication list but patient was on medication recently. Please advise.

## 2020-01-05 MED ORDER — FUROSEMIDE 20 MG PO TABS: 20.0000 mg | ORAL_TABLET | Freq: Two times a day (BID) | ORAL | 3 refills | Status: DC

## 2020-01-10 ENCOUNTER — Other Ambulatory Visit: Payer: Self-pay | Admitting: Physician Assistant

## 2020-01-10 MED ORDER — TELMISARTAN 80 MG PO TABS: 80.0000 mg | ORAL_TABLET | Freq: Every day | ORAL | 0 refills | Status: DC

## 2020-01-18 ENCOUNTER — Other Ambulatory Visit: Payer: Self-pay

## 2020-01-18 ENCOUNTER — Ambulatory Visit (HOSPITAL_COMMUNITY): Payer: Medicare Other | Attending: Cardiovascular Disease

## 2020-01-18 DIAGNOSIS — R609 Edema, unspecified: Secondary | ICD-10-CM | POA: Insufficient documentation

## 2020-01-18 DIAGNOSIS — I119 Hypertensive heart disease without heart failure: Secondary | ICD-10-CM | POA: Insufficient documentation

## 2020-01-18 DIAGNOSIS — R0602 Shortness of breath: Secondary | ICD-10-CM | POA: Diagnosis not present

## 2020-01-18 LAB — ECHOCARDIOGRAM COMPLETE
AR max vel: 2.78 cm2
AV Area VTI: 2.51 cm2
AV Area mean vel: 2.54 cm2
AV Mean grad: 8 mmHg
AV Peak grad: 14.4 mmHg
Ao pk vel: 1.9 m/s
Area-P 1/2: 4.6 cm2
S' Lateral: 3.4 cm

## 2020-01-18 MED ORDER — PERFLUTREN LIPID MICROSPHERE
1.0000 mL | INTRAVENOUS | Status: AC | PRN
  Administered 2020-01-18: 1 mL via INTRAVENOUS

## 2020-01-19 ENCOUNTER — Other Ambulatory Visit: Payer: Self-pay | Admitting: General Practice

## 2020-01-19 DIAGNOSIS — I270 Primary pulmonary hypertension: Secondary | ICD-10-CM

## 2020-01-26 ENCOUNTER — Other Ambulatory Visit: Payer: Self-pay

## 2020-01-26 DIAGNOSIS — I824Y9 Acute embolism and thrombosis of unspecified deep veins of unspecified proximal lower extremity: Secondary | ICD-10-CM

## 2020-01-31 ENCOUNTER — Other Ambulatory Visit: Payer: Self-pay

## 2020-01-31 ENCOUNTER — Other Ambulatory Visit: Payer: Self-pay | Admitting: Surgery

## 2020-01-31 ENCOUNTER — Ambulatory Visit (INDEPENDENT_AMBULATORY_CARE_PROVIDER_SITE_OTHER): Payer: Medicare Other | Admitting: Physician Assistant

## 2020-01-31 ENCOUNTER — Ambulatory Visit (HOSPITAL_COMMUNITY)
Admission: RE | Admit: 2020-01-31 | Discharge: 2020-01-31 | Disposition: A | Discharge status: Home / Self Care | Payer: Medicare Other | Source: Ambulatory Visit | Attending: Surgery | Admitting: Surgery

## 2020-01-31 ENCOUNTER — Encounter: Payer: Self-pay | Admitting: Physician Assistant

## 2020-01-31 VITALS — BP 156/83 | HR 78 | Temp 98.3°F | Resp 20 | Ht 68.0 in | Wt 249.0 lb

## 2020-01-31 DIAGNOSIS — M7989 Other specified soft tissue disorders: Secondary | ICD-10-CM

## 2020-01-31 DIAGNOSIS — L03116 Cellulitis of left lower limb: Secondary | ICD-10-CM | POA: Diagnosis not present

## 2020-01-31 DIAGNOSIS — I824Y9 Acute embolism and thrombosis of unspecified deep veins of unspecified proximal lower extremity: Secondary | ICD-10-CM

## 2020-01-31 NOTE — Progress Notes (Signed)
[facility] [place]   Reason for referral: cellulitis  History of Present Illness  Scott Jensen is a 64 y.o. male who presents with chief complaint: swollen leg.  Patient notes, onset of swelling over the lat 2 months months ago, associated with unknown cause.  The patient has had no history of DVT, no history of varicose vein, no history of venous stasis ulcers, no history of  Lymphedema and no history of skin changes in lower legs.  There is no family history of venous disorders.  The patient has not used compression stockings in the past.  He was seen by his primary care Physician assistant for left LE edema with erythema.  He was started on Keflex and and a diuretic.  At his f/u visit the patients redness and pain had improved.  He had a negative DVT study.  He continued to have some edema.    Past Medical History:  Diagnosis Date  . Cervical adenopathy    Steel plate C5-C6  . Environmental allergies   . History of chicken pox   . Hyperglycemia    Borderline Daibetes  . Hypertension   . Increased pressure in the eye   . Measles   . Mumps   . Seasonal allergies     Past Surgical History:  Procedure Laterality Date  . Cervical Hardware Placement     . CHOLECYSTECTOMY    . COLONOSCOPY     Q5 yrs  . TONSILLECTOMY    . WISDOM TOOTH EXTRACTION      Social History   Socioeconomic History  . Marital status: Divorced    Spouse name: Not on file  . Number of children: Not on file  . Years of education: Not on file  . Highest education level: Not on file  Occupational History  . Not on file  Tobacco Use  . Smoking status: Light Tobacco Smoker    Years: 3.00    Types: Cigarettes  . Smokeless tobacco: Never Used  Vaping Use  . Vaping Use: Never used  Substance and Sexual Activity  . Alcohol use: Yes    Alcohol/week: 5.0 standard drinks    Types: 5 Standard drinks or equivalent per week    Comment: beer only  . Drug use: No  . Sexual activity:  Yes    Partners: Female  Other Topics Concern  . Not on file  Social History Narrative  . Not on file   Social Determinants of Health   Financial Resource Strain:   . Difficulty of Paying Living Expenses: Not on file  Food Insecurity:   . Worried About Charity fundraiser in the Last Year: Not on file  . Ran Out of Food in the Last Year: Not on file  Transportation Needs:   . Lack of Transportation (Medical): Not on file  . Lack of Transportation (Non-Medical): Not on file  Physical Activity:   . Days of Exercise per Week: Not on file  . Minutes of Exercise per Session: Not on file  Stress:   . Feeling of Stress : Not on file  Social Connections:   . Frequency of Communication with Friends and Family: Not on file  . Frequency of Social Gatherings with Friends and Family: Not on file  . Attends Religious Services: Not on file  . Active Member of Clubs or Organizations: Not on file  . Attends Archivist Meetings: Not on file  . Marital Status: Not on file  Intimate Partner  Violence:   . Fear of Current or Ex-Partner: Not on file  . Emotionally Abused: Not on file  . Physically Abused: Not on file  . Sexually Abused: Not on file    Family History  Problem Relation Age of Onset  . Brain cancer Mother 14       Deceased  . Liver cancer Mother        Mets  . Anxiety disorder Mother   . Colon cancer Father 31       Deceased  . Alcoholism Father   . Cancer Other        Aunts & Uncles  . Leukemia Sister   . Leukemia Cousin   . Hypertension Brother   . Mental illness Brother     Current Outpatient Medications on File Prior to Visit  Medication Sig Dispense Refill  . albuterol (VENTOLIN HFA) 108 (90 Base) MCG/ACT inhaler Inhale 1-2 puffs into the lungs every 6 (six) hours as needed for wheezing or shortness of breath. 18 each 6  . Coenzyme Q10 (COQ10) 100 MG CAPS Take 1 capsule by mouth daily. Take along with cholesterol medication to help prevent muscle aches  30 each 0  . Fluticasone-Salmeterol (ADVAIR DISKUS) 250-50 MCG/DOSE AEPB Inhale 1 puff into the lungs 2 (two) times daily. 1 each 3  . furosemide (LASIX) 20 MG tablet Take 1 tablet (20 mg total) by mouth 2 (two) times daily. 30 tablet 3  . telmisartan (MICARDIS) 80 MG tablet Take 1 tablet (80 mg total) by mouth daily. 90 tablet 0   No current facility-administered medications on file prior to visit.    Allergies as of 01/31/2020  . (No Known Allergies)     ROS:   General:  No weight loss, Fever, chills  HEENT: No recent headaches, no nasal bleeding, no visual changes, no sore throat  Neurologic: No dizziness, blackouts, seizures. No recent symptoms of stroke or mini- stroke. No recent episodes of slurred speech, or temporary blindness.  Cardiac: No recent episodes of chest pain/pressure, no shortness of breath at rest.  No shortness of breath with exertion.  Denies history of atrial fibrillation or irregular heartbeat  Vascular: No history of rest pain in feet.  No history of claudication.  No history of non-healing ulcer, No history of DVT   Pulmonary: No home oxygen, no productive cough, no hemoptysis,  History of asthma or wheezing  Musculoskeletal:  [ ]  Arthritis, [ ]  Low back pain,  [ ]  Joint pain  Hematologic:No history of hypercoagulable state.  No history of easy bleeding.  No history of anemia  Gastrointestinal: No hematochezia or melena,  No gastroesophageal reflux, no trouble swallowing  Urinary: [ ]  chronic Kidney disease, [ ]  on HD - [ ]  MWF or [ ]  TTHS, [ ]  Burning with urination, [ ]  Frequent urination, [ ]  Difficulty urinating;   Skin: No rashes  Psychological: No history of anxiety,  No history of depression  Physical Examination  Vitals:   01/31/20 1222  BP: (!) 156/83  Pulse: 78  Resp: 20  Temp: 98.3 F (36.8 C)  TempSrc: Temporal  SpO2: 93%  Weight: 249 lb (112.9 kg)  Height: 5\' 8"  (1.727 m)    Body mass index is 37.86 kg/m.  General:   Alert and oriented, no acute distress HEENT: Normal Neck: No bruit or JVD Pulmonary: Clear to auscultation bilaterally Cardiac: Regular Rate and Rhythm without murmur Abdomen: Soft, non-tender, non-distended, no mass, no scars Skin: No rash Extremity Pulses:  2+ radial, brachial, femoral, dorsalis pedis, posterior tibial pulses bilaterally Musculoskeletal:minimal edema left LE without erythema Neurologic: Upper and lower extremity motor 5/5 and symmetric  DATA:   +--------------+---------+------+-----------+------------+--------+  LEFT     Reflux NoRefluxReflux TimeDiameter cmsComments               Yes                   +--------------+---------+------+-----------+------------+--------+  CFV      no                         +--------------+---------+------+-----------+------------+--------+  FV mid    no                         +--------------+---------+------+-----------+------------+--------+  Popliteal   no                         +--------------+---------+------+-----------+------------+--------+  GSV at [facility]  no               0.74        +--------------+---------+------+-----------+------------+--------+  GSV prox thighno               0.52        +--------------+---------+------+-----------+------------+--------+  GSV dist thighno               0.44        +--------------+---------+------+-----------+------------+--------+  GSV prox calf no                         +--------------+---------+------+-----------+------------+--------+  SSV Pop Fossa no               0.26        +--------------+---------+------+-----------+------------+--------+    Assessment: His left Le duplex is negative for DVT and venous  reflux of deep or superficial vein reflux in the left lower  extremity.  His cellulitis has resolved after Po antibiotics for 30 days.     Plan:He does have off and on edema we placed him in knee high compression 15-20 mmhg  To be worn daily until he has recovered.  He will elevate his legs when at rest.  He will f/u PRN  Roxy Horseman PA-C [facility] [place]: [phone]  MD in clinic [place]

## 2020-02-15 ENCOUNTER — Other Ambulatory Visit: Payer: Self-pay

## 2020-02-15 ENCOUNTER — Encounter: Payer: Self-pay | Admitting: Pulmonary Disease

## 2020-02-15 ENCOUNTER — Encounter: Payer: Medicare Other | Admitting: Pulmonary Disease

## 2020-04-10 ENCOUNTER — Other Ambulatory Visit: Payer: Self-pay | Admitting: Physician Assistant

## 2020-06-30 ENCOUNTER — Other Ambulatory Visit: Payer: Self-pay | Admitting: Physician Assistant

## 2020-10-05 ENCOUNTER — Other Ambulatory Visit: Payer: Self-pay | Admitting: Family

## 2020-10-10 ENCOUNTER — Other Ambulatory Visit: Payer: Self-pay

## 2020-10-10 DIAGNOSIS — I270 Primary pulmonary hypertension: Secondary | ICD-10-CM

## 2020-10-10 MED ORDER — TELMISARTAN 80 MG PO TABS: 80.0000 mg | ORAL_TABLET | Freq: Every day | ORAL | 0 refills | Status: DC

## 2020-11-13 ENCOUNTER — Other Ambulatory Visit: Payer: Self-pay

## 2020-11-13 ENCOUNTER — Emergency Department (HOSPITAL_BASED_OUTPATIENT_CLINIC_OR_DEPARTMENT_OTHER): Payer: Medicare Other

## 2020-11-13 ENCOUNTER — Encounter (HOSPITAL_BASED_OUTPATIENT_CLINIC_OR_DEPARTMENT_OTHER): Payer: Self-pay | Admitting: Radiology

## 2020-11-13 ENCOUNTER — Inpatient Hospital Stay (HOSPITAL_BASED_OUTPATIENT_CLINIC_OR_DEPARTMENT_OTHER)
Admission: EM | Admit: 2020-11-13 | Discharge: 2020-11-18 | DRG: 190 | Disposition: A | Discharge status: Home / Self Care | Payer: Medicare Other | Attending: Internal Medicine | Admitting: Internal Medicine

## 2020-11-13 DIAGNOSIS — I5082 Biventricular heart failure: Secondary | ICD-10-CM | POA: Diagnosis not present

## 2020-11-13 DIAGNOSIS — K7469 Other cirrhosis of liver: Secondary | ICD-10-CM

## 2020-11-13 DIAGNOSIS — R079 Chest pain, unspecified: Secondary | ICD-10-CM | POA: Diagnosis not present

## 2020-11-13 DIAGNOSIS — E871 Hypo-osmolality and hyponatremia: Secondary | ICD-10-CM | POA: Diagnosis not present

## 2020-11-13 DIAGNOSIS — E785 Hyperlipidemia, unspecified: Secondary | ICD-10-CM | POA: Diagnosis not present

## 2020-11-13 DIAGNOSIS — J9621 Acute and chronic respiratory failure with hypoxia: Secondary | ICD-10-CM | POA: Diagnosis not present

## 2020-11-13 DIAGNOSIS — I2721 Secondary pulmonary arterial hypertension: Secondary | ICD-10-CM | POA: Diagnosis not present

## 2020-11-13 DIAGNOSIS — Z20822 Contact with and (suspected) exposure to covid-19: Secondary | ICD-10-CM | POA: Diagnosis present

## 2020-11-13 DIAGNOSIS — Z79899 Other long term (current) drug therapy: Secondary | ICD-10-CM

## 2020-11-13 DIAGNOSIS — G4733 Obstructive sleep apnea (adult) (pediatric): Secondary | ICD-10-CM | POA: Diagnosis present

## 2020-11-13 DIAGNOSIS — J9601 Acute respiratory failure with hypoxia: Secondary | ICD-10-CM

## 2020-11-13 DIAGNOSIS — F17210 Nicotine dependence, cigarettes, uncomplicated: Secondary | ICD-10-CM | POA: Diagnosis not present

## 2020-11-13 DIAGNOSIS — I214 Non-ST elevation (NSTEMI) myocardial infarction: Secondary | ICD-10-CM | POA: Diagnosis not present

## 2020-11-13 DIAGNOSIS — Z6836 Body mass index (BMI) 36.0-36.9, adult: Secondary | ICD-10-CM

## 2020-11-13 DIAGNOSIS — I10 Essential (primary) hypertension: Secondary | ICD-10-CM

## 2020-11-13 DIAGNOSIS — J9622 Acute and chronic respiratory failure with hypercapnia: Secondary | ICD-10-CM | POA: Diagnosis not present

## 2020-11-13 DIAGNOSIS — I160 Hypertensive urgency: Secondary | ICD-10-CM | POA: Diagnosis present

## 2020-11-13 DIAGNOSIS — I25110 Atherosclerotic heart disease of native coronary artery with unstable angina pectoris: Secondary | ICD-10-CM | POA: Diagnosis present

## 2020-11-13 DIAGNOSIS — J441 Chronic obstructive pulmonary disease with (acute) exacerbation: Principal | ICD-10-CM | POA: Diagnosis present

## 2020-11-13 DIAGNOSIS — Z720 Tobacco use: Secondary | ICD-10-CM | POA: Diagnosis not present

## 2020-11-13 DIAGNOSIS — I4892 Unspecified atrial flutter: Secondary | ICD-10-CM | POA: Diagnosis present

## 2020-11-13 DIAGNOSIS — R519 Headache, unspecified: Secondary | ICD-10-CM | POA: Diagnosis present

## 2020-11-13 DIAGNOSIS — Z8616 Personal history of COVID-19: Secondary | ICD-10-CM | POA: Diagnosis not present

## 2020-11-13 DIAGNOSIS — I5033 Acute on chronic diastolic (congestive) heart failure: Secondary | ICD-10-CM | POA: Diagnosis not present

## 2020-11-13 DIAGNOSIS — I5032 Chronic diastolic (congestive) heart failure: Secondary | ICD-10-CM

## 2020-11-13 DIAGNOSIS — E1165 Type 2 diabetes mellitus with hyperglycemia: Secondary | ICD-10-CM | POA: Diagnosis present

## 2020-11-13 DIAGNOSIS — Z66 Do not resuscitate: Secondary | ICD-10-CM | POA: Diagnosis present

## 2020-11-13 DIAGNOSIS — I4891 Unspecified atrial fibrillation: Secondary | ICD-10-CM | POA: Diagnosis present

## 2020-11-13 DIAGNOSIS — I110 Hypertensive heart disease with heart failure: Secondary | ICD-10-CM | POA: Diagnosis not present

## 2020-11-13 DIAGNOSIS — R0902 Hypoxemia: Secondary | ICD-10-CM | POA: Diagnosis present

## 2020-11-13 DIAGNOSIS — Z8249 Family history of ischemic heart disease and other diseases of the circulatory system: Secondary | ICD-10-CM

## 2020-11-13 DIAGNOSIS — Z9119 Patient's noncompliance with other medical treatment and regimen: Secondary | ICD-10-CM

## 2020-11-13 DIAGNOSIS — J9602 Acute respiratory failure with hypercapnia: Secondary | ICD-10-CM

## 2020-11-13 DIAGNOSIS — I7777 Dissection of artery of lower extremity: Secondary | ICD-10-CM | POA: Diagnosis not present

## 2020-11-13 DIAGNOSIS — D751 Secondary polycythemia: Secondary | ICD-10-CM | POA: Diagnosis present

## 2020-11-13 DIAGNOSIS — I119 Hypertensive heart disease without heart failure: Secondary | ICD-10-CM | POA: Diagnosis present

## 2020-11-13 HISTORY — DX: Chronic diastolic (congestive) heart failure: I50.32

## 2020-11-13 HISTORY — DX: Acute respiratory failure with hypoxia: J96.01

## 2020-11-13 HISTORY — DX: Other cirrhosis of liver: K74.69

## 2020-11-13 LAB — BASIC METABOLIC PANEL
Anion gap: 9 (ref 5–15)
BUN: 11 mg/dL (ref 8–23)
CO2: 30 mmol/L (ref 22–32)
Calcium: 9 mg/dL (ref 8.9–10.3)
Chloride: 95 mmol/L — ABNORMAL LOW (ref 98–111)
Creatinine, Ser: 0.7 mg/dL (ref 0.61–1.24)
GFR, Estimated: >60 mL/min (ref >60)
Glucose, Bld: 162 mg/dL — ABNORMAL HIGH (ref 70–99)
Potassium: 4.6 mmol/L (ref 3.5–5.1)
Sodium: 134 mmol/L — ABNORMAL LOW (ref 135–145)

## 2020-11-13 LAB — CBC WITH DIFFERENTIAL/PLATELET
Abs Immature Granulocytes: 0.04 10*3/uL (ref 0.00–0.07)
Basophils Absolute: 0.1 10*3/uL (ref 0.0–0.1)
Basophils Relative: 1 %
Eosinophils Absolute: 0.1 10*3/uL (ref 0.0–0.5)
Eosinophils Relative: 1 %
HCT: 60.5 % — ABNORMAL HIGH (ref 39.0–52.0)
Hemoglobin: 19.5 g/dL — ABNORMAL HIGH (ref 13.0–17.0)
Immature Granulocytes: 0 %
Lymphocytes Relative: 16 %
Lymphs Abs: 1.6 10*3/uL (ref 0.7–4.0)
MCH: 30.5 pg (ref 26.0–34.0)
MCHC: 32.2 g/dL (ref 30.0–36.0)
MCV: 94.5 fL (ref 80.0–100.0)
Monocytes Absolute: 1.3 10*3/uL — ABNORMAL HIGH (ref 0.1–1.0)
Monocytes Relative: 13 %
Neutro Abs: 7.2 10*3/uL (ref 1.7–7.7)
Neutrophils Relative %: 69 %
Platelets: 178 10*3/uL (ref 150–400)
RBC: 6.4 MIL/uL — ABNORMAL HIGH (ref 4.22–5.81)
RDW: 15 % (ref 11.5–15.5)
WBC: 10.3 10*3/uL (ref 4.0–10.5)
nRBC: 0 % (ref 0.0–0.2)

## 2020-11-13 LAB — RESP PANEL BY RT-PCR (FLU A&B, COVID) ARPGX2
Influenza A by PCR: NEGATIVE
Influenza B by PCR: NEGATIVE
SARS Coronavirus 2 by RT PCR: NEGATIVE

## 2020-11-13 LAB — BRAIN NATRIURETIC PEPTIDE: B Natriuretic Peptide: 136.4 pg/mL — ABNORMAL HIGH (ref 0.0–100.0)

## 2020-11-13 LAB — TROPONIN I (HIGH SENSITIVITY)
Troponin I (High Sensitivity): 18 ng/L — ABNORMAL HIGH (ref <18)
Troponin I (High Sensitivity): 15 ng/L (ref <18)

## 2020-11-13 MED ORDER — INSULIN ASPART 100 UNIT/ML IJ SOLN
0.0000 [IU] | INTRAMUSCULAR | Status: DC
  Administered 2020-11-14: 3 [IU] via SUBCUTANEOUS
  Administered 2020-11-14 (×2): 5 [IU] via SUBCUTANEOUS
  Administered 2020-11-14 (×2): 8 [IU] via SUBCUTANEOUS
  Administered 2020-11-14: 3 [IU] via SUBCUTANEOUS
  Administered 2020-11-15: 8 [IU] via SUBCUTANEOUS
  Administered 2020-11-15: 3 [IU] via SUBCUTANEOUS
  Administered 2020-11-15: 2 [IU] via SUBCUTANEOUS
  Administered 2020-11-15: 5 [IU] via SUBCUTANEOUS
  Administered 2020-11-15 – 2020-11-16 (×2): 2 [IU] via SUBCUTANEOUS
  Administered 2020-11-16 (×2): 3 [IU] via SUBCUTANEOUS
  Administered 2020-11-16: 2 [IU] via SUBCUTANEOUS
  Administered 2020-11-17: 3 [IU] via SUBCUTANEOUS
  Administered 2020-11-17: 5 [IU] via SUBCUTANEOUS

## 2020-11-13 MED ORDER — NITROGLYCERIN 0.4 MG SL SUBL
0.4000 mg | SUBLINGUAL_TABLET | Freq: Once | SUBLINGUAL | Status: AC
  Administered 2020-11-13: 0.4 mg via SUBLINGUAL
  Filled 2020-11-13: qty 1

## 2020-11-13 MED ORDER — FOLIC ACID 1 MG PO TABS
1.0000 mg | ORAL_TABLET | Freq: Every day | ORAL | Status: DC
  Administered 2020-11-14 – 2020-11-18 (×5): 1 mg via ORAL
  Filled 2020-11-13 (×5): qty 1

## 2020-11-13 MED ORDER — SODIUM CHLORIDE 0.9% FLUSH
3.0000 mL | Freq: Two times a day (BID) | INTRAVENOUS | Status: DC
  Administered 2020-11-14 – 2020-11-15 (×4): 3 mL via INTRAVENOUS

## 2020-11-13 MED ORDER — SODIUM CHLORIDE 0.9% FLUSH: 3.0000 mL | INTRAVENOUS | Status: DC | PRN

## 2020-11-13 MED ORDER — SPIRONOLACTONE 12.5 MG HALF TABLET
12.5000 mg | ORAL_TABLET | Freq: Every day | ORAL | Status: DC
  Administered 2020-11-14 – 2020-11-15 (×2): 12.5 mg via ORAL
  Filled 2020-11-13 (×2): qty 1

## 2020-11-13 MED ORDER — ALBUTEROL SULFATE (2.5 MG/3ML) 0.083% IN NEBU: 2.5000 mg | INHALATION_SOLUTION | RESPIRATORY_TRACT | Status: DC | PRN

## 2020-11-13 MED ORDER — PREDNISONE 20 MG PO TABS
40.0000 mg | ORAL_TABLET | Freq: Every day | ORAL | Status: DC
  Administered 2020-11-15 – 2020-11-18 (×2): 40 mg via ORAL
  Filled 2020-11-13 (×3): qty 2

## 2020-11-13 MED ORDER — FENTANYL CITRATE (PF) 100 MCG/2ML IJ SOLN
50.0000 ug | Freq: Once | INTRAMUSCULAR | Status: AC
Start: 2020-11-13 — End: 2020-11-13
  Administered 2020-11-13: 50 ug via INTRAVENOUS
  Filled 2020-11-13: qty 2

## 2020-11-13 MED ORDER — METHYLPREDNISOLONE SODIUM SUCC 125 MG IJ SOLR
60.0000 mg | Freq: Two times a day (BID) | INTRAMUSCULAR | Status: AC
  Administered 2020-11-14 (×2): 60 mg via INTRAVENOUS
  Filled 2020-11-13 (×2): qty 2

## 2020-11-13 MED ORDER — IPRATROPIUM-ALBUTEROL 0.5-2.5 (3) MG/3ML IN SOLN
3.0000 mL | Freq: Once | RESPIRATORY_TRACT | Status: AC
  Administered 2020-11-13: 3 mL via RESPIRATORY_TRACT
  Filled 2020-11-13: qty 3

## 2020-11-13 MED ORDER — ALBUTEROL (5 MG/ML) CONTINUOUS INHALATION SOLN
5.0000 mg/h | INHALATION_SOLUTION | Freq: Once | RESPIRATORY_TRACT | Status: DC
Start: 2020-11-13 — End: 2020-11-18
  Filled 2020-11-13: qty 40

## 2020-11-13 MED ORDER — MOMETASONE FURO-FORMOTEROL FUM 200-5 MCG/ACT IN AERO
2.0000 | INHALATION_SPRAY | Freq: Two times a day (BID) | RESPIRATORY_TRACT | Status: DC
  Administered 2020-11-14 – 2020-11-17 (×5): 2 via RESPIRATORY_TRACT
  Filled 2020-11-13: qty 8.8

## 2020-11-13 MED ORDER — METHYLPREDNISOLONE SODIUM SUCC 125 MG IJ SOLR
125.0000 mg | Freq: Once | INTRAMUSCULAR | Status: AC
  Administered 2020-11-13: 125 mg via INTRAVENOUS
  Filled 2020-11-13: qty 2

## 2020-11-13 MED ORDER — ONDANSETRON HCL 4 MG/2ML IJ SOLN
4.0000 mg | Freq: Once | INTRAMUSCULAR | Status: DC
  Filled 2020-11-13: qty 2

## 2020-11-13 MED ORDER — THIAMINE HCL 100 MG PO TABS
100.0000 mg | ORAL_TABLET | Freq: Every day | ORAL | Status: DC
  Administered 2020-11-14 – 2020-11-18 (×5): 100 mg via ORAL
  Filled 2020-11-13 (×5): qty 1

## 2020-11-13 MED ORDER — DOXYCYCLINE HYCLATE 100 MG PO TABS
100.0000 mg | ORAL_TABLET | Freq: Two times a day (BID) | ORAL | Status: AC
  Administered 2020-11-14 – 2020-11-18 (×10): 100 mg via ORAL
  Filled 2020-11-13 (×10): qty 1

## 2020-11-13 MED ORDER — FENTANYL CITRATE (PF) 100 MCG/2ML IJ SOLN
50.0000 ug | INTRAMUSCULAR | Status: DC | PRN
  Administered 2020-11-13 – 2020-11-14 (×2): 50 ug via INTRAVENOUS
  Filled 2020-11-13 (×2): qty 2

## 2020-11-13 MED ORDER — IPRATROPIUM-ALBUTEROL 0.5-2.5 (3) MG/3ML IN SOLN
3.0000 mL | Freq: Four times a day (QID) | RESPIRATORY_TRACT | Status: DC
  Administered 2020-11-14 – 2020-11-15 (×3): 3 mL via RESPIRATORY_TRACT
  Filled 2020-11-13 (×5): qty 3

## 2020-11-13 MED ORDER — SODIUM CHLORIDE 0.9 % IV SOLN: 250.0000 mL | INTRAVENOUS | Status: DC | PRN

## 2020-11-13 MED ORDER — ALBUTEROL (5 MG/ML) CONTINUOUS INHALATION SOLN
10.0000 mg/h | INHALATION_SOLUTION | Freq: Once | RESPIRATORY_TRACT | Status: AC
  Administered 2020-11-13: 10 mg/h via RESPIRATORY_TRACT

## 2020-11-13 MED ORDER — ALBUTEROL SULFATE (2.5 MG/3ML) 0.083% IN NEBU
INHALATION_SOLUTION | RESPIRATORY_TRACT | Status: AC
  Filled 2020-11-13: qty 6

## 2020-11-13 MED ORDER — FUROSEMIDE 10 MG/ML IJ SOLN
40.0000 mg | Freq: Every day | INTRAMUSCULAR | Status: DC
  Administered 2020-11-14 – 2020-11-15 (×2): 40 mg via INTRAVENOUS
  Filled 2020-11-13 (×2): qty 4

## 2020-11-13 MED ORDER — TRAMADOL HCL 50 MG PO TABS
50.0000 mg | ORAL_TABLET | Freq: Four times a day (QID) | ORAL | Status: DC | PRN
  Administered 2020-11-14: 50 mg via ORAL
  Filled 2020-11-13: qty 1

## 2020-11-13 MED ORDER — IOHEXOL 350 MG/ML SOLN
100.0000 mL | Freq: Once | INTRAVENOUS | Status: AC | PRN
  Administered 2020-11-13: 100 mL via INTRAVENOUS

## 2020-11-13 NOTE — ED Notes (Signed)
Patient given macaroni and cheese to eat.

## 2020-11-13 NOTE — Progress Notes (Signed)
Arrived from [place] to 3E-16. Denies chest pain, dizziness, shortness of breath. C/O HA 5/10 aching. Contacted patient placement for admitting hospitalist to assess and place orders. Awaiting Dr Roel Cluck for orders. VS WNL but BP elevated systolic-See VS trends. On 2L [place], RR even/non labored and sitting on the side of the bed. PIV LFA flushed and assessed. Skin assessment and head to toe assessment completed. Call light in reach. Awaiting orders.

## 2020-11-13 NOTE — ED Triage Notes (Signed)
Pt states woke up with chest pain this morning, radiating to neck. Also c/o SOB. SpO2 82% on RA at arrival. Pain with deep breath.

## 2020-11-13 NOTE — ED Provider Notes (Signed)
[facility] Provider Note   CSN: [id number] Arrival date & time: 11/13/20  1043     History Chief Complaint  Patient presents with   Shortness of Breath   Chest Pain    Scott Jensen is a 65 y.o. male.  Presenting to the emergency room with concern for shortness of breath.  Patient reports that yesterday he noted slight shortness of breath but this morning was much more severe.  Also this morning started having central chest pain.  Radiates up towards neck.  Currently moderate.  Sharp pain.  Pain also gets worse when time to take a deep breath.  States he has chronic lower leg swelling, no change in legs today.  Has had prior DVT study which she said was normal.  Patient states that he may have COPD but he is unsure if he has been formally diagnosed.  Per review of chart has history of emphysema, COPD, hypertension, hyperlipidemia, CAD, tobacco abuse.  HPI     Past Medical History:  Diagnosis Date   Cervical adenopathy    Steel plate C5-C6   Environmental allergies    History of chicken pox    Hyperglycemia    Borderline Daibetes   Hypertension    Increased pressure in the eye    Measles    Mumps    Seasonal allergies     Patient Active Problem List   Diagnosis Date Noted   Centrilobular emphysema ([place]) 11/06/2018   CAD (coronary artery disease), native coronary artery 12/07/2016   OSA (obstructive sleep apnea) 12/07/2016   Tachycardia 12/07/2016   COPD with acute exacerbation ([place]) 12/06/2016   Hyperlipidemia 11/05/2016   Hypertensive heart disease without CHF 06/07/2015   Cardiomegaly 06/07/2015   Lipoma of arm 05/31/2015   Tobacco abuse disorder 05/22/2014    Past Surgical History:  Procedure Laterality Date   Cervical Hardware Placement      CHOLECYSTECTOMY     COLONOSCOPY     Q5 yrs   TONSILLECTOMY     WISDOM TOOTH EXTRACTION         Family History  Problem Relation Age of Onset   Brain cancer Mother 52        Deceased   Liver cancer Mother        Mets   Anxiety disorder Mother    Colon cancer Father 70       Deceased   Alcoholism Father    Cancer Other        Aunts & Uncles   Leukemia Sister    Leukemia Cousin    Hypertension Brother    Mental illness Brother     Social History   Tobacco Use   Smoking status: Light Smoker    Years: 3.00    Types: Cigarettes   Smokeless tobacco: Never  Vaping Use   Vaping Use: Never used  Substance Use Topics   Alcohol use: Yes    Alcohol/week: 5.0 standard drinks    Types: 5 Standard drinks or equivalent per week    Comment: beer only   Drug use: No    Home Medications Prior to Admission medications   Medication Sig Start Date End Date Taking? Authorizing Provider  albuterol (VENTOLIN HFA) 108 (90 Base) MCG/ACT inhaler Inhale 1-2 puffs into the lungs every 6 (six) hours as needed for wheezing or shortness of breath. 12/24/19   Brunetta Jeans, PA-C  Coenzyme Q10 (COQ10) 100 MG CAPS Take 1 capsule by mouth daily. Take along with  cholesterol medication to help prevent muscle aches 07/04/17   Brunetta Jeans, PA-C  Fluticasone-Salmeterol (ADVAIR DISKUS) 250-50 MCG/DOSE AEPB Inhale 1 puff into the lungs 2 (two) times daily. 12/24/19   Brunetta Jeans, PA-C  telmisartan (MICARDIS) 80 MG tablet Take 1 tablet (80 mg total) by mouth daily. 10/10/20   Midge Minium, MD    Allergies    Patient has no known allergies.  Review of Systems   Review of Systems  Constitutional:  Positive for fatigue. Negative for chills and fever.  HENT:  Negative for ear pain and sore throat.   Eyes:  Negative for pain and visual disturbance.  Respiratory:  Positive for shortness of breath. Negative for cough.   Cardiovascular:  Positive for chest pain. Negative for palpitations.  Gastrointestinal:  Negative for abdominal pain and vomiting.  Genitourinary:  Negative for dysuria and hematuria.  Musculoskeletal:  Negative for arthralgias and back pain.  Skin:   Negative for color change and rash.  Neurological:  Negative for seizures and syncope.  All other systems reviewed and are negative.  Physical Exam Updated Vital Signs BP (!) 170/70   Pulse 96   Temp 98 F (36.7 C)   Resp (!) 21   Ht 5\' 8"  (1.727 m)   Wt 108.9 kg   SpO2 97%   BMI 36.49 kg/m   Physical Exam Vitals and nursing note reviewed.  Constitutional:      Appearance: He is well-developed.  HENT:     Head: Normocephalic and atraumatic.  Eyes:     Conjunctiva/sclera: Conjunctivae normal.  Cardiovascular:     Rate and Rhythm: Normal rate and regular rhythm.     Heart sounds: No murmur heard. Pulmonary:     Comments: Some tachypnea present but speaks in full sentences, inspiratory and expiratory wheeze appreciated bilaterally Abdominal:     Palpations: Abdomen is soft.     Tenderness: There is no abdominal tenderness.  Musculoskeletal:     Cervical back: Neck supple.     Comments: Bilateral nonpitting mild edema in lower legs  Skin:    General: Skin is warm and dry.  Neurological:     Mental Status: He is alert.    ED Results / Procedures / Treatments   Labs (all labs ordered are listed, but only abnormal results are displayed) Labs Reviewed  CBC WITH DIFFERENTIAL/PLATELET - Abnormal; Notable for the following components:      Result Value   RBC 6.40 (*)    Hemoglobin 19.5 (*)    HCT 60.5 (*)    Monocytes Absolute 1.3 (*)    All other components within normal limits  BASIC METABOLIC PANEL - Abnormal; Notable for the following components:   Sodium 134 (*)    Chloride 95 (*)    Glucose, Bld 162 (*)    All other components within normal limits  BRAIN NATRIURETIC PEPTIDE - Abnormal; Notable for the following components:   B Natriuretic Peptide 136.4 (*)    All other components within normal limits  TROPONIN I (HIGH SENSITIVITY) - Abnormal; Notable for the following components:   Troponin I (High Sensitivity) 18 (*)    All other components within normal  limits  RESP PANEL BY RT-PCR (FLU A&B, COVID) ARPGX2  TROPONIN I (HIGH SENSITIVITY)    EKG EKG Interpretation  Date/Time:  Monday November 13 2020 12:09:04 EDT Ventricular Rate:  91 PR Interval:  166 QRS Duration: 96 QT Interval:  364 QTC Calculation: 448 R Axis:   111  Text Interpretation: Sinus rhythm Atrial premature complex Probable left atrial enlargement Consider left ventricular hypertrophy ST elevation, consider anterolateral injury no acute stemi Confirmed by Madalyn Rob [phone]) on 11/13/2020 3:13:01 PM  Radiology DG Chest Portable 1 View  Result Date: 11/13/2020 CLINICAL DATA:  65 year old male woke with chest pain radiating to the neck. Hypoxia on room air. EXAM: PORTABLE CHEST 1 VIEW COMPARISON:  Chest radiograph 11/06/2018 and earlier. FINDINGS: Portable AP semi upright view at 1104 hours. Stable mild cardiomegaly and mediastinal contours. Visualized tracheal air column is within normal limits. Mildly lower lung volumes. Allowing for portable technique the lungs are clear. No pneumothorax. Chronic cervical ACDF. No acute osseous abnormality identified. IMPRESSION: No acute cardiopulmonary abnormality. Electronically Signed   By: Genevie Ann M.D.   On: 11/13/2020 11:43   CT Angio Chest/Abd/Pel for Dissection W and/or Wo Contrast  Result Date: 11/13/2020 CLINICAL DATA:  Chest pain radiating to the neck. Suspect aortic dissection. EXAM: CT ANGIOGRAPHY CHEST, ABDOMEN AND PELVIS TECHNIQUE: Non-contrast CT of the chest was initially obtained. Multidetector CT imaging through the chest, abdomen and pelvis was performed using the standard protocol during bolus administration of intravenous contrast. Multiplanar reconstructed images and MIPs were obtained and reviewed to evaluate the vascular anatomy. CONTRAST:  130mL OMNIPAQUE IOHEXOL 350 MG/ML SOLN COMPARISON:  CT angiography chest 07/23/2018 FINDINGS: CTA CHEST FINDINGS Cardiovascular: Heart size is within normal limits. Extensive coronary  calcifications. No aortic intramural hematoma identified on noncontrast images. Thoracic aorta is normal in caliber. No significant stenosis. Mediastinum/Nodes: Nonenlarged lymph nodes seen throughout the mediastinum. No enlarged axillary or hilar lymph nodes. Lungs/Pleura: Lungs are clear. No pleural effusion or pneumothorax. Musculoskeletal: No chest wall abnormality. No acute or significant osseous findings. Review of the MIP images confirms the above findings. CTA ABDOMEN AND PELVIS FINDINGS VASCULAR Aorta: Scattered atheromatous plaque without aneurysmal dilatation or significant stenosis. Celiac: Patent without evidence of aneurysm, dissection, vasculitis or significant stenosis. SMA: Patent without evidence of aneurysm, dissection, vasculitis or significant stenosis. Renals: Both renal arteries are patent without evidence of aneurysm, dissection, vasculitis, fibromuscular dysplasia or significant stenosis. IMA: Patent. Inflow: Scattered calcified atheromatous plaque of the common, internal common external iliac arteries without high-grade stenosis. 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 The right profunda femoris artery originates above the level of the hip. No significant abnormality of the visualized left outflow arteries. Veins: No obvious venous abnormality within the limitations of this arterial phase study. Review of the MIP images confirms the above findings. NON-VASCULAR Hepatobiliary: Postsurgical changes of cholecystectomy are seen. Hepatic contours appear nodular, suspicious for cirrhosis. No significant biliary dilatation. Pancreas: Unremarkable. No pancreatic ductal dilatation or surrounding inflammatory changes. Spleen: Normal in size without focal abnormality. Adrenals/Urinary Tract: Adrenal glands are normal. 1.9 cm simple cyst present in the lower pole of the right kidney. Kidneys, ureters, and bladder otherwise  unremarkable. Stomach/Bowel: No bowel dilatation to indicate ileus or obstruction. Appendix is normal. Extensive diverticulosis of the descending and sigmoid colon. Lymphatic: No enlarged abdominal or pelvic lymph nodes. Multiple nonenlarged lymph nodes are seen in the retroperitoneum. Insert most likely reactive. Reproductive: Mildly enlarged prostate. Other: No abdominal wall hernia or abnormality. No abdominopelvic ascites. Musculoskeletal: No acute or significant osseous findings. Review of the MIP images confirms the above findings. IMPRESSION: 1. No acute abnormality of the chest, abdomen, or pelvis. No aortic dissection. 2. 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 3. Nodular hepatic contours  are suspicious for cirrhosis. 4. Distal colonic diverticulosis without evidence of acute diverticulitis. Electronically Signed   By: Miachel Roux M.D.   On: 11/13/2020 13:50    Procedures .Critical Care  Date/Time: 11/13/2020 3:11 PM Performed by: Lucrezia Starch, MD Authorized by: Lucrezia Starch, MD   Critical care provider statement:    Critical care time (minutes):  42   Critical care was necessary to treat or prevent imminent or life-threatening deterioration of the following conditions:  Cardiac failure and respiratory failure   Critical care was time spent personally by me on the following activities:  Discussions with consultants, evaluation of patient's response to treatment, examination of patient, ordering and performing treatments and interventions, ordering and review of laboratory studies, ordering and review of radiographic studies, pulse oximetry, re-evaluation of patient's condition, obtaining history from patient or surrogate and review of old charts Ultrasound ED Echo  Date/Time: 11/13/2020 3:12 PM Performed by: Lucrezia Starch, MD Authorized by: Lucrezia Starch, MD   Procedure details:    Indications: chest pain     Views:  parasternal long axis view, parasternal short axis view and apical 4 chamber view     Images: archived     Limitations:  Patient compliance, positioning and body habitus Findings:    Pericardium: no pericardial effusion     LV Function: normal (>50% EF)     RV Diameter: normal   Impression:    Impression: normal     Medications Ordered in ED Medications  fentaNYL (SUBLIMAZE) injection 50 mcg (50 mcg Intravenous Given 11/13/20 1247)  albuterol (PROVENTIL,VENTOLIN) solution continuous neb (5 mg/hr Nebulization Not Given 11/13/20 1359)  nitroGLYCERIN (NITROSTAT) SL tablet 0.4 mg (0.4 mg Sublingual Given 11/13/20 1114)  ipratropium-albuterol (DUONEB) 0.5-2.5 (3) MG/3ML nebulizer solution 3 mL (3 mLs Nebulization Given 11/13/20 1126)  fentaNYL (SUBLIMAZE) injection 50 mcg (50 mcg Intravenous Given 11/13/20 1113)  nitroGLYCERIN (NITROSTAT) SL tablet 0.4 mg (0.4 mg Sublingual Given 11/13/20 1228)  iohexol (OMNIPAQUE) 350 MG/ML injection 100 mL (100 mLs Intravenous Contrast Given 11/13/20 1254)  albuterol (PROVENTIL,VENTOLIN) solution continuous neb (10 mg/hr Nebulization Given 11/13/20 1358)  albuterol (PROVENTIL) (2.5 MG/3ML) 0.083% nebulizer solution (  Given 11/13/20 1359)  methylPREDNISolone sodium succinate (SOLU-MEDROL) 125 mg/2 mL injection 125 mg (125 mg Intravenous Given 11/13/20 1503)    ED Course  I have reviewed the triage vital signs and the nursing notes.  Pertinent labs & imaging results that were available during my care of the patient were reviewed by me and considered in my medical decision making (see chart for details).    MDM Rules/Calculators/A&P                          65 year old male presenting to ER with concern for chest pain as well as shortness of breath.  On exam, patient appeared somewhat uncomfortable from pain, hypertensive, hypoxic, some wheezing.  Broad differential.  EKG unchanged from prior.  Troponin x2 within normal limits, therefore lower suspicion for ACS.   Bedside echo performed, no obvious pericardial effusion, no obvious right heart strain.  CTA chest abdomen pelvis was negative for dissection or pulmonary embolism.  Given the wheeze appreciated on exam, symptoms may be related to COPD exacerbation.  This was treated with steroids, neb treatments.  BP and pain treated with sublingual nitro and fentanyl.  Patient on last reassessment appeared quite comfortable actually, had no ongoing pain but he still is having significant oxygen requirement, currently  on 5 to 6 L nasal cannula.  Will consult [facility] for admit.  Final Clinical Impression(s) / ED Diagnoses Final diagnoses:  Hypoxia  Acute respiratory failure with hypoxia (HCC)  Hypertension, unspecified type  Chest pain, unspecified type    Rx / DC Orders ED Discharge Orders     None        Lucrezia Starch, MD 11/13/20 1517

## 2020-11-13 NOTE — H&P (Addendum)
Scott Jensen JHE:[id number] DOB: 1955/06/01 DOA: 11/13/2020     PCP: Brunetta Jeans, PA-C   Outpatient Specialists:     Pulmonary "did not work out"    Patient arrived to ER on 11/13/20 at 101 Referred by Attending Ezekiel Slocumb, DO   Patient coming from: home Lives alone,       Chief Complaint:   Chief Complaint  Patient presents with   Shortness of Breath   Chest Pain    HPI: Scott Jensen is a 65 y.o. male with medical history significant of Pulmonary hypertension HLD, HTN, asthma/COPD, diastolic CHF, tobacco abuse    Presented with   CP this AM that radiated to his neck also associated shortness of breath noted to be satting 82% on room air upon arrival. Shortness of breath started yesterday and today became more severe.  Also some central chest pain radiating towards the neck.  Worse with deep inspiration. He has chronic leg edema which has been studied in the past and negative for DVT last year    Has been smoking on off. Now not really Case of beer lasts week Reports he has regular loose stools Has daily headaches after taking his BP and takes tylenol 3 tabs at a time times a day  Reports wheezing, coughing  Reports chronic hypoxia with O2 running in low 80's at baseline He was supposed to be on oxygen some time ago but did not qualify bc he was borderline at the time and never readdressed it since.  Has   been vaccinated against COVID     Initial COVID TEST  NEGATIVE   Lab Results  Component Value Date   Scott Jensen NEGATIVE 11/13/2020    Regarding pertinent Chronic problems:  Hyperlipidemia - not on statins Lipid Panel     Component Value Date/Time   CHOL 122 12/31/2019 1031   TRIG 80.0 12/31/2019 1031   HDL 42.00 12/31/2019 1031   CHOLHDL 3 12/31/2019 1031   VLDL 16.0 12/31/2019 1031   LDLCALC 64 12/31/2019 1031    HTN on Telmisartan   chronic CHF diastolic - last echo Sep 1856    DM 2 -  Lab Results  Component Value Date    HGBA1C 6.8 (H) 12/31/2019    diet controlled    obesity-   BMI Readings from Last 1 Encounters:  11/13/20 36.49 kg/m      Asthma -well  controlled on home inhalers     While in ER: Noted to have hemoglobin up to 19.5 BNP 136 troponin 18 Was given DuoNeb and sublingual nitro CTA done chest and abdomen pelvis negative for dissection and pulmonary embolism Was started on Solu-Medrol Bedside echo was done showed no evidence of pericardial effusion or right heart strain Felt that symptoms more consistent with COPD exacerbation Started with steroids and nebulizer treatments patient's symptoms improved She did have some headache after nitro.  Required up to 5 to 6 L of nasal cannula  ED Triage Vitals  Enc Vitals Group     BP 11/13/20 1055 (!) 205/131     Pulse Rate 11/13/20 1054 98     Resp 11/13/20 1055 (!) 26     Temp 11/13/20 1055 98 F (36.7 C)     Temp Source 11/13/20 1930 Oral     SpO2 11/13/20 1047 (!) 82 %     Weight 11/13/20 1053 240 lb (108.9 kg)     Height 11/13/20 1053 5\' 8"  (1.727 m)     Head  Circumference --      Peak Flow --      Pain Score 11/13/20 1053 9     Pain Loc --      Pain Edu? --      Excl. in [place]? --   TMAX(24)@     _________________________________________ Significant initial  Findings: Abnormal Labs Reviewed  CBC WITH DIFFERENTIAL/PLATELET - Abnormal; Notable for the following components:      Result Value   RBC 6.40 (*)    Hemoglobin 19.5 (*)    HCT 60.5 (*)    Monocytes Absolute 1.3 (*)    All other components within normal limits  BASIC METABOLIC PANEL - Abnormal; Notable for the following components:   Sodium 134 (*)    Chloride 95 (*)    Glucose, Bld 162 (*)    All other components within normal limits  BRAIN NATRIURETIC PEPTIDE - Abnormal; Notable for the following components:   B Natriuretic Peptide 136.4 (*)    All other components within normal limits  TROPONIN I (HIGH SENSITIVITY) - Abnormal; Notable for the following components:    Troponin I (High Sensitivity) 18 (*)    All other components within normal limits   ____________________________________________ Ordered  CXR -  NON acute    CTA chest - non acute, no PE, no evidence of infiltrate  CT abdomen - suspicious for cirrhosis _________________________ Troponin 18  - 15 ECG: Ordered Personally reviewed by me showing: HR : 91 Rhythm: NSR, left ventricular hypertrophy  nonspecific changes,  QTC 448 _______________ The recent clinical data is shown below. Vitals:   11/13/20 1745 11/13/20 1815 11/13/20 1845 11/13/20 1930  BP: (!) 150/84 (!) 162/93  (!) 165/95  Pulse: 92 87 85 88  Resp: (!) 22 (!) 28 (!) 24 (!) 23  Temp:    98.2 F (36.8 C)  TempSrc:    Oral  SpO2: 92% 92% 92% 92%  Weight:      Height:       WBC     Component Value Date/Time   WBC 10.3 11/13/2020 1105   LYMPHSABS 1.6 11/13/2020 1105   MONOABS 1.3 (H) 11/13/2020 1105   EOSABS 0.1 11/13/2020 1105   BASOSABS 0.1 11/13/2020 1105    Lactic Acid, Venous No results found for: LATICACIDVEN    UA ordered   Urine analysis:    Component Value Date/Time   COLORURINE YELLOW 11/05/2016 0900   APPEARANCEUR CLEAR 11/05/2016 0900   LABSPEC 1.010 11/05/2016 0900   PHURINE 6.0 11/05/2016 0900   GLUCOSEU NEGATIVE 11/05/2016 0900   HGBUR NEGATIVE 11/05/2016 0900   BILIRUBINUR NEGATIVE 11/05/2016 0900   KETONESUR NEGATIVE 11/05/2016 0900   UROBILINOGEN 0.2 11/05/2016 0900   NITRITE NEGATIVE 11/05/2016 0900   LEUKOCYTESUR NEGATIVE 11/05/2016 0900    Results for orders placed or performed during the hospital encounter of 11/13/20  Resp Panel by RT-PCR (Flu A&B, Covid) Nasopharyngeal Swab     Status: None   Collection Time: 11/13/20 11:05 AM   Specimen: Nasopharyngeal Swab; Nasopharyngeal(NP) swabs in vial transport medium  Result Value Ref Range Status   SARS Coronavirus 2 by RT PCR NEGATIVE NEGATIVE Final        Influenza A by PCR NEGATIVE NEGATIVE Final   Influenza B by PCR  NEGATIVE NEGATIVE Final        _______________________________________________ Hospitalist was called for admission for  COPD exacerbation  The following Work up has been ordered so far:  Orders Placed This Encounter  Procedures   Critical  Care   ED Echo US Bedside   Resp Panel by RT-PCR (Flu A&B, Covid) Nasopharyngeal Swab   DG Chest Portable 1 View   CT Angio Chest/Abd/Pel for Dissection W and/or Wo Contrast   CBC with Differential   Basic metabolic panel   Brain natriuretic peptide   Cardiac monitoring   Consult to hospitalist   EKG 12-Lead   EKG 12-Lead   Place in observation (patient's expected length of stay will be less than 2 midnights)   Following Medications were ordered in ER: Medications  fentaNYL (SUBLIMAZE) injection 50 mcg (50 mcg Intravenous Given 11/13/20 1247)  albuterol (PROVENTIL,VENTOLIN) solution continuous neb (5 mg/hr Nebulization Not Given 11/13/20 1359)  nitroGLYCERIN (NITROSTAT) SL tablet 0.4 mg (0.4 mg Sublingual Given 11/13/20 1114)  ipratropium-albuterol (DUONEB) 0.5-2.5 (3) MG/3ML nebulizer solution 3 mL (3 mLs Nebulization Given 11/13/20 1126)  fentaNYL (SUBLIMAZE) injection 50 mcg (50 mcg Intravenous Given 11/13/20 1113)  nitroGLYCERIN (NITROSTAT) SL tablet 0.4 mg (0.4 mg Sublingual Given 11/13/20 1228)  iohexol (OMNIPAQUE) 350 MG/ML injection 100 mL (100 mLs Intravenous Contrast Given 11/13/20 1254)  albuterol (PROVENTIL,VENTOLIN) solution continuous neb (10 mg/hr Nebulization Given 11/13/20 1358)  albuterol (PROVENTIL) (2.5 MG/3ML) 0.083% nebulizer solution (  Given 11/13/20 1359)  methylPREDNISolone sodium succinate (SOLU-MEDROL) 125 mg/2 mL injection 125 mg (125 mg Intravenous Given 11/13/20 1503)        Consult Orders  (From admission, onward)           Start     Ordered   11/13/20 1437  Consult to hospitalist  Called, spoke with Jamiw @ 239  Once       Provider:  (Not yet assigned)  Question Answer Comment  Place call to: [facility]   Reason for Consult Admit      11/13/20 1436            OTHER Significant initial  Findings:  labs showing: Recent Labs  Lab 11/13/20 1105 11/13/20 2313  NA 134* 132*  K 4.6 4.7  CO2 30 30  GLUCOSE 162* 230*  BUN 11 7*  CREATININE 0.70 0.69  CALCIUM 9.0 9.1  MG  --  1.8  PHOS  --  3.1    Cr  stable,   Lab Results  Component Value Date   CREATININE 0.70 11/13/2020   CREATININE 0.71 12/31/2019   CREATININE 0.76 10/15/2017    Recent Labs  Lab 11/13/20 2313  AST 19  ALT 22  ALKPHOS 63  BILITOT 1.3*  PROT 7.4  ALBUMIN 4.0   Lab Results  Component Value Date   CALCIUM 9.1 11/13/2020   PHOS 3.1 11/13/2020      Plt: Lab Results  Component Value Date   PLT 178 11/13/2020    COVID-19 Labs  No results for input(s): DDIMER, FERRITIN, LDH, CRP in the last 72 hours.  Lab Results  Component Value Date   [place] NEGATIVE 11/13/2020     Venous  Blood Gas result:  pH  7.296  pCO2 64.6 .   Recent Labs  Lab 11/13/20 1105  WBC 10.3  NEUTROABS 7.2  HGB 19.5*  HCT 60.5*  MCV 94.5  PLT 178    HG/HCT Up from baseline see below    Component Value Date/Time   HGB 19.5 (H) 11/13/2020 1105   HCT 60.5 (H) 11/13/2020 1105   MCV 94.5 11/13/2020 1105      No results for input(s): LIPASE, AMYLASE in the last 168 hours. Recent Labs  Lab 11/13/20 2313  AMMONIA 36*     Cardiac Panel (last 3 results) Recent Labs    11/13/20 2313  CKTOTAL 42*     BNP (last 3 results) Recent Labs    11/13/20 1105  BNP 136.4*      DM  labs:  HbA1C: Recent Labs    12/31/19 1031  HGBA1C 6.8*    CBG (last 3)  Recent Labs    11/14/20 0112  GLUCAP 228*          Cultures: No results found for: SDES, SPECREQUEST, CULT, REPTSTATUS   Radiological Exams on Admission: DG Chest Portable 1 View  Result Date: 11/13/2020 CLINICAL DATA:  65 year old male woke with chest pain radiating to the neck. Hypoxia on room air. EXAM: PORTABLE CHEST 1  VIEW COMPARISON:  Chest radiograph 11/06/2018 and earlier. FINDINGS: Portable AP semi upright view at 1104 hours. Stable mild cardiomegaly and mediastinal contours. Visualized tracheal air column is within normal limits. Mildly lower lung volumes. Allowing for portable technique the lungs are clear. No pneumothorax. Chronic cervical ACDF. No acute osseous abnormality identified. IMPRESSION: No acute cardiopulmonary abnormality. Electronically Signed   By: Genevie Ann M.D.   On: 11/13/2020 11:43   CT Angio Chest/Abd/Pel for Dissection W and/or Wo Contrast  Result Date: 11/13/2020 CLINICAL DATA:  Chest pain radiating to the neck. Suspect aortic dissection. EXAM: CT ANGIOGRAPHY CHEST, ABDOMEN AND PELVIS TECHNIQUE: Non-contrast CT of the chest was initially obtained. Multidetector CT imaging through the chest, abdomen and pelvis was performed using the standard protocol during bolus administration of intravenous contrast. Multiplanar reconstructed images and MIPs were obtained and reviewed to evaluate the vascular anatomy. CONTRAST:  160mL OMNIPAQUE IOHEXOL 350 MG/ML SOLN COMPARISON:  CT angiography chest 07/23/2018 FINDINGS: CTA CHEST FINDINGS Cardiovascular: Heart size is within normal limits. Extensive coronary calcifications. No aortic intramural hematoma identified on noncontrast images. Thoracic aorta is normal in caliber. No significant stenosis. Mediastinum/Nodes: Nonenlarged lymph nodes seen throughout the mediastinum. No enlarged axillary or hilar lymph nodes. Lungs/Pleura: Lungs are clear. No pleural effusion or pneumothorax. Musculoskeletal: No chest wall abnormality. No acute or significant osseous findings. Review of the MIP images confirms the above findings. CTA ABDOMEN AND PELVIS FINDINGS VASCULAR Aorta: Scattered atheromatous plaque without aneurysmal dilatation or significant stenosis. Celiac: Patent without evidence of aneurysm, dissection, vasculitis or significant stenosis. SMA: Patent without  evidence of aneurysm, dissection, vasculitis or significant stenosis. Renals: Both renal arteries are patent without evidence of aneurysm, dissection, vasculitis, fibromuscular dysplasia or significant stenosis. IMA: Patent. Inflow: Scattered calcified atheromatous plaque of the common, internal common external iliac arteries without high-grade stenosis. 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 The right profunda femoris artery originates above the level of the hip. No significant abnormality of the visualized left outflow arteries. Veins: No obvious venous abnormality within the limitations of this arterial phase study. Review of the MIP images confirms the above findings. NON-VASCULAR Hepatobiliary: Postsurgical changes of cholecystectomy are seen. Hepatic contours appear nodular, suspicious for cirrhosis. No significant biliary dilatation. Pancreas: Unremarkable. No pancreatic ductal dilatation or surrounding inflammatory changes. Spleen: Normal in size without focal abnormality. Adrenals/Urinary Tract: Adrenal glands are normal. 1.9 cm simple cyst present in the lower pole of the right kidney. Kidneys, ureters, and bladder otherwise unremarkable. Stomach/Bowel: No bowel dilatation to indicate ileus or obstruction. Appendix is normal. Extensive diverticulosis of the descending and sigmoid colon. Lymphatic: No enlarged abdominal or pelvic lymph nodes. Multiple nonenlarged lymph nodes are seen in the  retroperitoneum. Insert most likely reactive. Reproductive: Mildly enlarged prostate. Other: No abdominal wall hernia or abnormality. No abdominopelvic ascites. Musculoskeletal: No acute or significant osseous findings. Review of the MIP images confirms the above findings. IMPRESSION: 1. No acute abnormality of the chest, abdomen, or pelvis. No aortic dissection. 2. 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 3. Nodular hepatic contours are suspicious for cirrhosis. 4. Distal colonic diverticulosis without evidence of acute diverticulitis. Electronically Signed   By: Miachel Roux M.D.   On: 11/13/2020 13:50   _______________________________________________________________________________________________________ Latest  Blood pressure (!) 165/95, pulse 88, temperature 98.2 F (36.8 C), temperature source Oral, resp. rate (!) 23, height 5\' 8"  (1.727 m), weight 108.9 kg, SpO2 92 %.   Review of Systems:    Pertinent positives include: Bilateral lower extremity swelling   Constitutional:  No weight loss, night sweats, Fevers, chills, fatigue, weight loss  HEENT:  No headaches, Difficulty swallowing,Tooth/dental problems,Sore throat,  No sneezing, itching, ear ache, nasal congestion, post nasal drip,  Cardio-vascular:  No chest pain, Orthopnea, PND, anasarca, dizziness, palpitations.no  GI:  No heartburn, indigestion, abdominal pain, nausea, vomiting, diarrhea, change in bowel habits, loss of appetite, melena, blood in stool, hematemesis Resp:  no shortness of breath at rest. No dyspnea on exertion, No excess mucus, no productive cough, No non-productive cough, No coughing up of blood.No change in color of mucus. No wheezing. Skin:  no rash or lesions. No jaundice GU:  no dysuria, change in color of urine, no urgency or frequency. No straining to urinate.  No flank pain.  Musculoskeletal:  No joint pain or no joint swelling. No decreased range of motion. No back pain.  Psych:  No change in mood or affect. No depression or anxiety. No memory loss.  Neuro: no localizing neurological complaints, no tingling, no weakness, no double vision, no gait abnormality, no slurred speech, no confusion  All systems reviewed and apart from [place] all are negative _______________________________________________________________________________________________ Past Medical History:   Past Medical  History:  Diagnosis Date   Cervical adenopathy    Steel plate C5-C6   Environmental allergies    History of chicken pox    Hyperglycemia    Borderline Daibetes   Hypertension    Increased pressure in the eye    Measles    Mumps    Seasonal allergies      Past Surgical History:  Procedure Laterality Date   Cervical Hardware Placement      CHOLECYSTECTOMY     COLONOSCOPY     Q5 yrs   TONSILLECTOMY     WISDOM TOOTH EXTRACTION      Social History:  Ambulatory  independently         reports that he has been smoking cigarettes. He has never used smokeless tobacco. He reports current alcohol use of about 5.0 standard drinks of alcohol per week. He reports that he does not use drugs.    Family History:   Family History  Problem Relation Age of Onset   Brain cancer Mother 58       Deceased   Liver cancer Mother        Mets   Anxiety disorder Mother    Colon cancer Father 2       Deceased   Alcoholism Father    Cancer Other        Aunts & Uncles   Leukemia Sister    Leukemia Cousin    Hypertension Brother  Mental illness Brother    ______________________________________________________________________________________________ Allergies: No Known Allergies   Prior to Admission medications   Medication Sig Start Date End Date Taking? Authorizing Provider  albuterol (VENTOLIN HFA) 108 (90 Base) MCG/ACT inhaler Inhale 1-2 puffs into the lungs every 6 (six) hours as needed for wheezing or shortness of breath. 12/24/19   Brunetta Jeans, PA-C  Coenzyme Q10 (COQ10) 100 MG CAPS Take 1 capsule by mouth daily. Take along with cholesterol medication to help prevent muscle aches 07/04/17   Brunetta Jeans, PA-C  Fluticasone-Salmeterol (ADVAIR DISKUS) 250-50 MCG/DOSE AEPB Inhale 1 puff into the lungs 2 (two) times daily. 12/24/19   Brunetta Jeans, PA-C  telmisartan (MICARDIS) 80 MG tablet Take 1 tablet (80 mg total) by mouth daily. 10/10/20   Midge Minium, MD     ___________________________________________________________________________________________________ Physical Exam: Vitals with BMI 11/13/2020 11/13/2020 11/13/2020  Height - - -  Weight - - -  BMI - - -  Systolic 709 - 628  Diastolic 95 - 93  Pulse 88 85 87     1. General:  in   distress increased work of breathing   Chronically ill -appearing 2. Psychological: Alert and  Oriented 3. Head/ENT:   Moist   Mucous Membranes                          Head Non traumatic, neck supple                          Poor Dentition 4. SKIN: decreased Skin turgor,  Skin clean Dry and intact no rash 5. Heart: Regular rate and rhythm no  Murmur, no Rub or gallop 6. Lungs:  no wheezes or crackles   7. Abdomen: Soft,  non-tender, Non distended  obese  bowel sounds present 8. Lower extremities: no clubbing, cyanosis, no edema 9. Neurologically Grossly intact, moving all 4 extremities equally  10. MSK: Normal range of motion    Chart has been reviewed  ______________________________________________________________________________________________  Assessment/Plan 65 y.o. male with medical history significant of Pulmonary hypertension HLD, HTN, asthma/COPD, diastolic CHF, tobacco abuse      Admitted for COPD exacerbation  Present on Admission:  Acute respiratory failure with hypoxia and hypercarbia ([place]) -patient likely has chronic hypoxia, Note hemoglobin of 19.  Patient self describes that he has chronic hypoxia for the past 2 years.  Discussed importance of staying on home oxygen. Will need to have oxygen provided at time of discharge Chronic hypoxia likely secondary to emphysema/COPD VBG showing evidence of hypercarbia but appears to be compensated BiPAP as needed patient will benefit from evaluation for hypoventilation and possible home BiPAP   COPD with acute exacerbation ([place]) -   Will initiate: Steroid taper  -  Antibiotics  Doxycycline, - Albuterol  PRN, - scheduled duoneb,  -  Breo  or Dulera at discharge   -  Mucinex.  Titrate O2 to saturation >90%. Follow patients respiratory status.  VBG showing chronic hypercarbia  Currently mentating well no evidence of symptomatic hypercarbia   Tobacco abuse disorder -  Spoke about importance of quitting spent 5 minutes discussing options for treatment, prior attempts at quitting, and dangers of smoking  -At this point patient is   interested in quitting  - nursing tobacco cessation protocol  Alcohol use.  Patient denies heavy alcohol abuse.  Denies withdrawals.  Recommended avoiding alcohol given possibility of liver cirrhosis  CT imaging showing  cirrhosis of the liver.-Patient with alcohol use and Tylenol use As well as untreated hyperlipidemia Will need further investigation.  Will need to follow-up with GI Obtain hepatitis serologies LFTs, INR ammonia level and acetaminophen LFTs appear to be within normal limits as well as INR ammonia only slightly elevated unclear significance patient appears to be alert and oriented there is a slight asterixis that could be related to hypercarbia rather than ammonia elevation   Hyperlipidemia patient refuses to use statins check lipid panel   Chronic diastolic CHF (congestive heart failure) ([place]) obtain echogram start Lasix Patient with peripheral edema appears to be fluid overload check albumin as well. Gently diurese and monitor renal function  Headache -could be related to chronic hypoxia.  Patient states it happens every time he takes his blood pressure medications will hold off on Micardis Instead use Lasix and spironolactone Patient has been using heavily acetaminophen check acetaminophen level  DM 2-  - Order Sensitive  SSI   -  check TSH and HgA1C  Chest pain currently resolved troponin unremarkable CTA negative for PE but showing significant COPD and emphysema.  Obtain echogram pending result may need cardiology consult   Hyponatremia in the setting of fluid overload.   Obtain urine electrolytes check TSH  Other plan as per orders.  DVT prophylaxis:  SCD     Code Status:    Code Status: Prior  DNR/DNI   as per patient   I had personally discussed CODE STATUS with patient    Family Communication:   Family not at  Bedside    Disposition Plan:     To home once workup is complete and patient is stable   Following barriers for discharge:                                                       Will likely need home health, home O2, set up                            Would benefit from PT/OT eval prior to DC  Ordered                                      Transition of care consulted                   Nutrition    consulted                                  Consults called: none pending results of echogram may benefit from cardiology consult Pending results of work-up may need GI consult for follow-up of cirrhosis  Admission status:  ED Disposition     ED Disposition  Admit   Condition  --   [place]: [place] [[id number]]  Level of Care: Telemetry Cardiac [[id number]]  Interfacility transfer: Yes  May place patient in observation at [facility] or [place] if equivalent level of care is available:: Yes  Covid Evaluation: Confirmed COVID Negative  Diagnosis: Acute respiratory failure with hypoxia [facility]) [[id number]]  Admitting Physician: Ezekiel Slocumb [[id number]]  Attending Physician:  Nicole Kindred A [[id number]]           Obs     Level of care  progressive tele indefinitely please discontinue once patient no longer qualifies COVID-19 Labs    Lab Results  Component Value Date   [place] NEGATIVE 11/13/2020     Precautions: admitted as  Covid Negative      PPE: Used by the provider:   N95   eye Goggles,  Gloves      Fraser Busche 11/14/2020, 1:59 AM    [facility]     after 2 AM please page floor coverage PA If 7AM-7PM, please contact the day team taking care of the patient  using [email or web address]   Patient was evaluated in the context of the global COVID-19 pandemic, which necessitated consideration that the patient might be at risk for infection with the SARS-CoV-2 virus that causes COVID-19. Institutional protocols and algorithms that pertain to the evaluation of patients at risk for COVID-19 are in a state of rapid change based on information released by regulatory bodies including the CDC and federal and state organizations. These policies and algorithms were followed during the patient's care.

## 2020-11-14 ENCOUNTER — Observation Stay (HOSPITAL_COMMUNITY): Payer: Medicare Other

## 2020-11-14 DIAGNOSIS — Z720 Tobacco use: Secondary | ICD-10-CM | POA: Diagnosis not present

## 2020-11-14 DIAGNOSIS — I4892 Unspecified atrial flutter: Secondary | ICD-10-CM | POA: Diagnosis present

## 2020-11-14 DIAGNOSIS — E1165 Type 2 diabetes mellitus with hyperglycemia: Secondary | ICD-10-CM | POA: Diagnosis present

## 2020-11-14 DIAGNOSIS — I480 Paroxysmal atrial fibrillation: Secondary | ICD-10-CM | POA: Diagnosis not present

## 2020-11-14 DIAGNOSIS — I5031 Acute diastolic (congestive) heart failure: Secondary | ICD-10-CM | POA: Diagnosis not present

## 2020-11-14 DIAGNOSIS — J309 Allergic rhinitis, unspecified: Secondary | ICD-10-CM

## 2020-11-14 DIAGNOSIS — I25110 Atherosclerotic heart disease of native coronary artery with unstable angina pectoris: Secondary | ICD-10-CM | POA: Diagnosis present

## 2020-11-14 DIAGNOSIS — J9601 Acute respiratory failure with hypoxia: Secondary | ICD-10-CM | POA: Diagnosis not present

## 2020-11-14 DIAGNOSIS — J441 Chronic obstructive pulmonary disease with (acute) exacerbation: Secondary | ICD-10-CM | POA: Diagnosis present

## 2020-11-14 DIAGNOSIS — E785 Hyperlipidemia, unspecified: Secondary | ICD-10-CM | POA: Diagnosis present

## 2020-11-14 DIAGNOSIS — F17210 Nicotine dependence, cigarettes, uncomplicated: Secondary | ICD-10-CM | POA: Diagnosis present

## 2020-11-14 DIAGNOSIS — R519 Headache, unspecified: Secondary | ICD-10-CM | POA: Diagnosis present

## 2020-11-14 DIAGNOSIS — I272 Pulmonary hypertension, unspecified: Secondary | ICD-10-CM | POA: Diagnosis not present

## 2020-11-14 DIAGNOSIS — I214 Non-ST elevation (NSTEMI) myocardial infarction: Secondary | ICD-10-CM | POA: Diagnosis not present

## 2020-11-14 DIAGNOSIS — D751 Secondary polycythemia: Secondary | ICD-10-CM | POA: Diagnosis present

## 2020-11-14 DIAGNOSIS — E871 Hypo-osmolality and hyponatremia: Secondary | ICD-10-CM | POA: Diagnosis present

## 2020-11-14 DIAGNOSIS — Z6836 Body mass index (BMI) 36.0-36.9, adult: Secondary | ICD-10-CM | POA: Diagnosis not present

## 2020-11-14 DIAGNOSIS — I5033 Acute on chronic diastolic (congestive) heart failure: Secondary | ICD-10-CM | POA: Diagnosis present

## 2020-11-14 DIAGNOSIS — I351 Nonrheumatic aortic (valve) insufficiency: Secondary | ICD-10-CM | POA: Diagnosis not present

## 2020-11-14 DIAGNOSIS — J9622 Acute and chronic respiratory failure with hypercapnia: Secondary | ICD-10-CM

## 2020-11-14 DIAGNOSIS — I110 Hypertensive heart disease with heart failure: Secondary | ICD-10-CM | POA: Diagnosis present

## 2020-11-14 DIAGNOSIS — R0902 Hypoxemia: Secondary | ICD-10-CM | POA: Diagnosis present

## 2020-11-14 DIAGNOSIS — I160 Hypertensive urgency: Secondary | ICD-10-CM | POA: Diagnosis present

## 2020-11-14 DIAGNOSIS — I483 Typical atrial flutter: Secondary | ICD-10-CM | POA: Diagnosis not present

## 2020-11-14 DIAGNOSIS — I2721 Secondary pulmonary arterial hypertension: Secondary | ICD-10-CM | POA: Diagnosis present

## 2020-11-14 DIAGNOSIS — I5082 Biventricular heart failure: Secondary | ICD-10-CM | POA: Diagnosis present

## 2020-11-14 DIAGNOSIS — J9621 Acute and chronic respiratory failure with hypoxia: Secondary | ICD-10-CM

## 2020-11-14 DIAGNOSIS — G4733 Obstructive sleep apnea (adult) (pediatric): Secondary | ICD-10-CM

## 2020-11-14 DIAGNOSIS — Z20822 Contact with and (suspected) exposure to covid-19: Secondary | ICD-10-CM | POA: Diagnosis present

## 2020-11-14 DIAGNOSIS — I5032 Chronic diastolic (congestive) heart failure: Secondary | ICD-10-CM | POA: Diagnosis not present

## 2020-11-14 DIAGNOSIS — I7777 Dissection of artery of lower extremity: Secondary | ICD-10-CM | POA: Diagnosis present

## 2020-11-14 DIAGNOSIS — Z66 Do not resuscitate: Secondary | ICD-10-CM | POA: Diagnosis present

## 2020-11-14 DIAGNOSIS — I2510 Atherosclerotic heart disease of native coronary artery without angina pectoris: Secondary | ICD-10-CM | POA: Diagnosis not present

## 2020-11-14 DIAGNOSIS — Z8616 Personal history of COVID-19: Secondary | ICD-10-CM | POA: Diagnosis not present

## 2020-11-14 DIAGNOSIS — K7469 Other cirrhosis of liver: Secondary | ICD-10-CM | POA: Diagnosis present

## 2020-11-14 LAB — HEMOGLOBIN A1C
Hgb A1c MFr Bld: 7.4 % — ABNORMAL HIGH (ref 4.8–5.6)
Mean Plasma Glucose: 165.68 mg/dL

## 2020-11-14 LAB — COMPREHENSIVE METABOLIC PANEL
ALT: 22 U/L (ref 0–44)
ALT: 20 U/L (ref 0–44)
AST: 19 U/L (ref 15–41)
AST: 16 U/L (ref 15–41)
Albumin: 4 g/dL (ref 3.5–5.0)
Albumin: 3.8 g/dL (ref 3.5–5.0)
Alkaline Phosphatase: 63 U/L (ref 38–126)
Alkaline Phosphatase: 64 U/L (ref 38–126)
Anion gap: 7 (ref 5–15)
Anion gap: 7 (ref 5–15)
BUN: 7 mg/dL — ABNORMAL LOW (ref 8–23)
BUN: 6 mg/dL — ABNORMAL LOW (ref 8–23)
CO2: 30 mmol/L (ref 22–32)
CO2: 32 mmol/L (ref 22–32)
Calcium: 9.1 mg/dL (ref 8.9–10.3)
Calcium: 9.3 mg/dL (ref 8.9–10.3)
Chloride: 95 mmol/L — ABNORMAL LOW (ref 98–111)
Chloride: 94 mmol/L — ABNORMAL LOW (ref 98–111)
Creatinine, Ser: 0.69 mg/dL (ref 0.61–1.24)
Creatinine, Ser: 0.59 mg/dL — ABNORMAL LOW (ref 0.61–1.24)
GFR, Estimated: >60 mL/min (ref >60)
GFR, Estimated: >60 mL/min (ref >60)
Glucose, Bld: 230 mg/dL — ABNORMAL HIGH (ref 70–99)
Glucose, Bld: 196 mg/dL — ABNORMAL HIGH (ref 70–99)
Potassium: 4.7 mmol/L (ref 3.5–5.1)
Potassium: 4.7 mmol/L (ref 3.5–5.1)
Sodium: 132 mmol/L — ABNORMAL LOW (ref 135–145)
Sodium: 133 mmol/L — ABNORMAL LOW (ref 135–145)
Total Bilirubin: 1.3 mg/dL — ABNORMAL HIGH (ref 0.3–1.2)
Total Bilirubin: 1.2 mg/dL (ref 0.3–1.2)
Total Protein: 7.4 g/dL (ref 6.5–8.1)
Total Protein: 7.1 g/dL (ref 6.5–8.1)

## 2020-11-14 LAB — LIPID PANEL
Cholesterol: 142 mg/dL (ref 0–200)
HDL: 53 mg/dL (ref >40)
LDL Cholesterol: 82 mg/dL (ref 0–99)
Total CHOL/HDL Ratio: 2.7 RATIO
Triglycerides: 33 mg/dL (ref <150)
VLDL: 7 mg/dL (ref 0–40)

## 2020-11-14 LAB — ECHOCARDIOGRAM COMPLETE
AR max vel: 4.71 cm2
AV Area VTI: 4.48 cm2
AV Area mean vel: 4.27 cm2
AV Mean grad: 3 mmHg
AV Peak grad: 5.9 mmHg
Ao pk vel: 1.21 m/s
Area-P 1/2: 3.83 cm2
Height: 68 in
MV VTI: 2.67 cm2
S' Lateral: 3 cm
Weight: 4074.1 oz

## 2020-11-14 LAB — GLUCOSE, CAPILLARY
Glucose-Capillary: 255 mg/dL — ABNORMAL HIGH (ref 70–99)
Glucose-Capillary: 228 mg/dL — ABNORMAL HIGH (ref 70–99)
Glucose-Capillary: 180 mg/dL — ABNORMAL HIGH (ref 70–99)
Glucose-Capillary: 262 mg/dL — ABNORMAL HIGH (ref 70–99)
Glucose-Capillary: 174 mg/dL — ABNORMAL HIGH (ref 70–99)
Glucose-Capillary: 184 mg/dL — ABNORMAL HIGH (ref 70–99)
Glucose-Capillary: 214 mg/dL — ABNORMAL HIGH (ref 70–99)

## 2020-11-14 LAB — PROTIME-INR
INR: 1.2 (ref 0.8–1.2)
Prothrombin Time: 14.9 seconds (ref 11.4–15.2)

## 2020-11-14 LAB — CBC WITH DIFFERENTIAL/PLATELET
Abs Immature Granulocytes: 0.11 10*3/uL — ABNORMAL HIGH (ref 0.00–0.07)
Basophils Absolute: 0 10*3/uL (ref 0.0–0.1)
Basophils Relative: 0 %
Eosinophils Absolute: 0 10*3/uL (ref 0.0–0.5)
Eosinophils Relative: 0 %
HCT: 57.5 % — ABNORMAL HIGH (ref 39.0–52.0)
Hemoglobin: 18.4 g/dL — ABNORMAL HIGH (ref 13.0–17.0)
Immature Granulocytes: 1 %
Lymphocytes Relative: 3 %
Lymphs Abs: 0.4 10*3/uL — ABNORMAL LOW (ref 0.7–4.0)
MCH: 30.3 pg (ref 26.0–34.0)
MCHC: 32 g/dL (ref 30.0–36.0)
MCV: 94.7 fL (ref 80.0–100.0)
Monocytes Absolute: 0.7 10*3/uL (ref 0.1–1.0)
Monocytes Relative: 6 %
Neutro Abs: 11.5 10*3/uL — ABNORMAL HIGH (ref 1.7–7.7)
Neutrophils Relative %: 90 %
Platelets: 152 10*3/uL (ref 150–400)
RBC: 6.07 MIL/uL — ABNORMAL HIGH (ref 4.22–5.81)
RDW: 14.6 % (ref 11.5–15.5)
WBC: 12.8 10*3/uL — ABNORMAL HIGH (ref 4.0–10.5)
nRBC: 0 % (ref 0.0–0.2)

## 2020-11-14 LAB — BLOOD GAS, VENOUS
Acid-Base Excess: 4.4 mmol/L — ABNORMAL HIGH (ref 0.0–2.0)
Bicarbonate: 30.4 mmol/L — ABNORMAL HIGH (ref 20.0–28.0)
FIO2: 44
O2 Saturation: 86.5 %
Patient temperature: 37
pCO2, Ven: 64.4 mmHg — ABNORMAL HIGH (ref 44.0–60.0)
pH, Ven: 7.296 (ref 7.250–7.430)
pO2, Ven: 59.3 mmHg — ABNORMAL HIGH (ref 32.0–45.0)

## 2020-11-14 LAB — CREATININE, URINE, RANDOM: Creatinine, Urine: 78.56 mg/dL

## 2020-11-14 LAB — MAGNESIUM
Magnesium: 1.8 mg/dL (ref 1.7–2.4)
Magnesium: 1.8 mg/dL (ref 1.7–2.4)

## 2020-11-14 LAB — URINALYSIS, ROUTINE W REFLEX MICROSCOPIC
Bacteria, UA: NONE SEEN
Bilirubin Urine: NEGATIVE
Glucose, UA: >=500 mg/dL — AB
Hgb urine dipstick: NEGATIVE
Ketones, ur: NEGATIVE mg/dL
Leukocytes,Ua: NEGATIVE
Nitrite: NEGATIVE
Protein, ur: 100 mg/dL — AB
Specific Gravity, Urine: 1.021 (ref 1.005–1.030)
pH: 5 (ref 5.0–8.0)

## 2020-11-14 LAB — TROPONIN I (HIGH SENSITIVITY)
Troponin I (High Sensitivity): 15 ng/L (ref <18)
Troponin I (High Sensitivity): 45 ng/L — ABNORMAL HIGH (ref <18)

## 2020-11-14 LAB — AMMONIA: Ammonia: 36 umol/L — ABNORMAL HIGH (ref 9–35)

## 2020-11-14 LAB — TSH: TSH: 0.449 u[IU]/mL (ref 0.350–4.500)

## 2020-11-14 LAB — HEPATITIS PANEL, ACUTE
HCV Ab: NONREACTIVE
Hep A IgM: NONREACTIVE
Hep B C IgM: NONREACTIVE
Hepatitis B Surface Ag: NONREACTIVE

## 2020-11-14 LAB — OSMOLALITY: Osmolality: 290 mOsm/kg (ref 275–295)

## 2020-11-14 LAB — MRSA NEXT GEN BY PCR, NASAL: MRSA by PCR Next Gen: NOT DETECTED

## 2020-11-14 LAB — ACETAMINOPHEN LEVEL: Acetaminophen (Tylenol), Serum: <10 ug/mL — ABNORMAL LOW (ref 10–30)

## 2020-11-14 LAB — SODIUM, URINE, RANDOM: Sodium, Ur: 57 mmol/L

## 2020-11-14 LAB — PHOSPHORUS
Phosphorus: 3.1 mg/dL (ref 2.5–4.6)
Phosphorus: 2.9 mg/dL (ref 2.5–4.6)

## 2020-11-14 LAB — HIV ANTIBODY (ROUTINE TESTING W REFLEX): HIV Screen 4th Generation wRfx: NONREACTIVE

## 2020-11-14 LAB — CK: Total CK: 42 U/L — ABNORMAL LOW (ref 49–397)

## 2020-11-14 LAB — OSMOLALITY, URINE: Osmolality, Ur: 602 mOsm/kg (ref 300–900)

## 2020-11-14 MED ORDER — DILTIAZEM LOAD VIA INFUSION
10.0000 mg | Freq: Once | INTRAVENOUS | Status: AC
  Administered 2020-11-14: 10 mg via INTRAVENOUS
  Filled 2020-11-14: qty 10

## 2020-11-14 MED ORDER — PERFLUTREN LIPID MICROSPHERE
1.0000 mL | INTRAVENOUS | Status: AC | PRN
  Administered 2020-11-14: 3 mL via INTRAVENOUS
  Filled 2020-11-14: qty 10

## 2020-11-14 MED ORDER — FLUTICASONE PROPIONATE 50 MCG/ACT NA SUSP
1.0000 | Freq: Every day | NASAL | Status: DC
  Administered 2020-11-14 – 2020-11-15 (×2): 1 via NASAL
  Filled 2020-11-14: qty 16

## 2020-11-14 MED ORDER — NITROGLYCERIN 0.4 MG SL SUBL
SUBLINGUAL_TABLET | SUBLINGUAL | Status: AC
  Administered 2020-11-14: 0.4 mg via SUBLINGUAL
  Filled 2020-11-14: qty 1

## 2020-11-14 MED ORDER — LORATADINE 10 MG PO TABS
10.0000 mg | ORAL_TABLET | Freq: Every day | ORAL | Status: DC
  Administered 2020-11-14 – 2020-11-17 (×4): 10 mg via ORAL
  Filled 2020-11-14 (×5): qty 1

## 2020-11-14 MED ORDER — DILTIAZEM HCL-DEXTROSE 125-5 MG/125ML-% IV SOLN (PREMIX)
5.0000 mg/h | INTRAVENOUS | Status: DC
  Administered 2020-11-14 – 2020-11-16 (×5): 10 mg/h via INTRAVENOUS
  Filled 2020-11-14 (×6): qty 125

## 2020-11-14 MED ORDER — NITROGLYCERIN 0.4 MG SL SUBL: 0.4000 mg | SUBLINGUAL_TABLET | SUBLINGUAL | Status: DC | PRN

## 2020-11-14 NOTE — Progress Notes (Signed)
  Echocardiogram 2D Echocardiogram has been performed.  Scott Jensen 11/14/2020, 9:40 AM

## 2020-11-14 NOTE — Progress Notes (Signed)
Pt refuses CPAP non compliant at home as well.

## 2020-11-14 NOTE — Progress Notes (Addendum)
Called by RN. Pt complaining of chest pain. States is substernal pressure like pain that does not radiate. Associated with SOB and nausea but no vomiting. Worsened when got up from chair to walk to bed. Rates pain as 8/10. Improved to 6/10 now after being given fentanyl. Has palpitations.  Had similar pain when admitted yesterday.  Went to bedside to see pt. Pt is alert and oriented x3.  EKG shows atrial flutter with HR 140-150.  Cardizem infusion has been ordered and awaiting to be started by daytime attending.   CV-Irregularly Irregular rhythm with tachycardia.  Resp-CTA bilaterally. No wheezing or rhonchi.  Abdomen-Soft, NTND, normal bowel sounds.   Check Serial troponins Give NTG Cardizem infusion being started.  Monitor closely.  Echo done today and shows EF of 65-70% with LVH and LV diastolic dysfunction and mild decrease in right ventricle function   Addendum: Troponin levels have increased from 15 to 45 to 60. Chest pain free now. No EKG changes noted on monitor.  Cardiology consulted and will see

## 2020-11-14 NOTE — Progress Notes (Signed)
Nutrition Brief Note  RD consulted for COPD GOLD protocol.   Wt Readings from Last 15 Encounters:  11/14/20 115.5 kg  02/15/20 113.5 kg  01/31/20 112.9 kg  12/31/19 113.9 kg  10/15/17 110.2 kg  07/16/17 113.9 kg  07/02/17 111.6 kg  12/16/16 105.2 kg  12/06/16 99.8 kg  11/19/16 103.9 kg  11/05/16 101.6 kg  04/22/16 108.9 kg  10/03/15 111.3 kg  07/05/15 112.2 kg  06/07/15 111.8 kg   Scott Jensen is a 65 y.o. male with medical history significant of Pulmonary hypertension HLD, HTN, asthma/COPD, diastolic CHF, tobacco abuse  Pt admitted with COPD exacerbation.  Reviewed I/O's: -35 ml x 24 hours  UOP: 275 ml x 24 hours  Reviewed wt hx; wt has been stable over the past year.   Lab Results  Component Value Date   HGBA1C 7.4 (H) 11/13/2020  PTA DM medications are none.   Labs reviewed: Na: 133, CBGS: 180-222 (inpatient orders for glycemic control are 0-15 units insulin aspart every 4 hours).    Medications reviewed and include folic acid, lasix, aldactone, thiamine, and solu-medrol.   Body mass index is 38.72 kg/m. Patient meets criteria for obesity, class II based on current BMI. Obesity is a complex, chronic medical condition that is optimally managed by a multidisciplinary care team. Weight loss is not an ideal goal for an acute inpatient hospitalization. However, if further work-up for obesity is warranted, consider outpatient referral to outpatient bariatric service and/or [facility].    Current diet order is heart healthy/ carb modified, patient is consuming approximately n/a% of meals at this time. Labs and medications reviewed.   No nutrition interventions warranted at this time. If nutrition issues arise, please consult RD.   Loistine Chance, RD, LDN, [place] Registered Dietitian II Certified Diabetes Care and Education Specialist Please refer to [facility] for RD and/or RD on-call/weekend/after hours pager

## 2020-11-14 NOTE — Progress Notes (Signed)
SATURATION QUALIFICATIONS: (This note is used to comply with regulatory documentation for home oxygen)  Patient Saturations on Room Air at Rest = 86%  Patient Saturations on Room Air while Ambulating = 71%  Patient Saturations on 3 Liters of oxygen while Ambulating = 89%  Please briefly explain why patient needs home oxygen: Patient requires supplemental oxygen to maintain SpO2 >/88% at rest and with activity.  Scott Jensen, PT, DPT Acute Rehabilitation Services  Pager [phone] Office [phone]

## 2020-11-14 NOTE — Progress Notes (Signed)
Patient C/O mid, lower chest pain and diaphoresis.  EKG done.  HR 148 - A-Flutter.  MD notified. Patient rates the pain as 7/10.

## 2020-11-14 NOTE — Consult Note (Signed)
NAME:  Scott Jensen, MRN:  [id number], DOB:  27-Nov-1955, LOS: 0 ADMISSION DATE:  11/13/2020, CONSULTATION DATE:  11/13/2020 REFERRING MD:  Dr. Jasmine Pang, CHIEF COMPLAINT:  Pulm consult    History of Present Illness:  Scott Jensen is a 65 y.o. male with a past medical history of HLD, HTN, Asthma/COPD, Diastolic CHF, OSA, and current tobacco use who presented to the ED with complaints of chest pain with associated SOB and hypoxia. He was admitted under [facility] for management of an acute COPD management in the setting of multiple chronic co-morbidities.   PCCM consulted as a pulmonary consult to further manage chronic pulmonary problems   Scott Jensen states that he has suffered from dyspnea, worse on exertion, since his COVID PNA March 2020. He states that his oxygen saturations typically drop with any ambulation and this has been his baseline since COVID. He denies any recent or currently SOB, productive cough, or wheezing. He is noncompliant with follow up visits, inhalers or CPAP due to financial restraints.   Pertinent  Medical History  Pulmonary hypertension, HLD, HTN, Asthma/COPD, Diastolic CHF, OSA, and current tobacco use  Significant Hospital Events:  7/18 admitted per [facility] for acute COPD exacerbation   Interim History / Subjective:  As above   Objective   Blood pressure (!) 143/65, pulse 86, temperature 97.9 F (36.6 C), temperature source Oral, resp. rate 19, height 5\' 8"  (1.727 m), weight 115.5 kg, SpO2 90 %.        Intake/Output Summary (Last 24 hours) at 11/14/2020 1028 Last data filed at 11/14/2020 0510 Gross per 24 hour  Intake 240 ml  Output 275 ml  Net -35 ml   Filed Weights   11/13/20 1053 11/13/20 2300 11/14/20 0500  Weight: 108.9 kg 115.3 kg 115.5 kg    Examination: General: Middle aged male sitting up in bedside recline in NAD   HEENT: [place]/AT, MM pink/moist, PERRL,  Neuro: Alert and oriented x3 CV: s1s2 regular rate and rhythm, no murmur, rubs, or gallops,  PULM:   Slight bilateral rhonchi that is clear on instructed cough, oxygen saturations 90-91 on 3L [place] GI: soft, bowel sounds active in all 4 quadrants, non-tender, non-distended Extremities: warm/dry, non-pitting lower extremity edema Skin: no rashes or lesions  Resolved Hospital Problem list     Assessment & Plan:  Acute on chronic hypoxic and hypercapnic respiratory failure  -Patient states he has been experiencing hypoxia on exertion with drop in oxygen to upper 70's at home since COVID  -ABG 7/18 7.2 / 64.4 / 59.3 / 30.4 Acute COPD exacerbation POA -No wheezing or respiratory distress on assessment 7/19 Current tobacco use  OSA  -Patient report short use of CPAP after diagnosis but quickly became noncompliant and insurance stopped providing services for CPAP History of HFpEF -ECHO 7/19 with EF 65-70% with grade 1 diastolic dysfunction  P: Continue supplemental oxygen, wean for sats greater than 88 Head of bed elevated 30 degrees Ensure adequate pulmonary hygiene  Follow cultures  Mobilize as able  Diurese as able  Steroid taper in place  Continue BDs Patient will need to follow up with pulmonary at discharge, states he would like to establish with practice on discharge  Patient was educated on need for medical compliance moving forward but endorses low probability due to financial restraints    Best Practice   Diet/type: Regular consistency (see orders) DVT prophylaxis: SCD GI prophylaxis: PPI Lines: N/A Foley:  N/A Code Status:  DNR Last date of multidisciplinary goals of care  discussion: Per primary   Labs   CBC: Recent Labs  Lab 11/13/20 1105 11/14/20 0209  WBC 10.3 12.8*  NEUTROABS 7.2 11.5*  HGB 19.5* 18.4*  HCT 60.5* 57.5*  MCV 94.5 94.7  PLT 178 841    Basic Metabolic Panel: Recent Labs  Lab 11/13/20 1105 11/13/20 2313 11/14/20 0209  NA 134* 132* 133*  K 4.6 4.7 4.7  CL 95* 95* 94*  CO2 30 30 32  GLUCOSE 162* 230* 196*  BUN 11 7* 6*  CREATININE  0.70 0.69 0.59*  CALCIUM 9.0 9.1 9.3  MG  --  1.8 1.8  PHOS  --  3.1 2.9   GFR: Estimated Creatinine Clearance: 112 mL/min (A) (by C-G formula based on SCr of 0.59 mg/dL (L)). Recent Labs  Lab 11/13/20 1105 11/14/20 0209  WBC 10.3 12.8*    Liver Function Tests: Recent Labs  Lab 11/13/20 2313 11/14/20 0209  AST 19 16  ALT 22 20  ALKPHOS 63 64  BILITOT 1.3* 1.2  PROT 7.4 7.1  ALBUMIN 4.0 3.8   No results for input(s): LIPASE, AMYLASE in the last 168 hours. Recent Labs  Lab 11/13/20 2313  AMMONIA 36*    ABG    Component Value Date/Time   HCO3 30.4 (H) 11/13/2020 2313   O2SAT 86.5 11/13/2020 2313     Coagulation Profile: Recent Labs  Lab 11/13/20 2313  INR 1.2    Cardiac Enzymes: Recent Labs  Lab 11/13/20 2313  CKTOTAL 42*    HbA1C: Hgb A1c MFr Bld  Date/Time Value Ref Range Status  11/13/2020 11:13 PM 7.4 (H) 4.8 - 5.6 % Final    Comment:    (NOTE) Pre diabetes:          5.7%-6.4%  Diabetes:              >6.4%  Glycemic control for   <7.0% adults with diabetes   12/31/2019 10:31 AM 6.8 (H) 4.6 - 6.5 % Final    Comment:    Glycemic Control Guidelines for People with Diabetes:Non Diabetic:  <6%Goal of Therapy: <7%Additional Action Suggested:  >8%     CBG: Recent Labs  Lab 11/14/20 0112 11/14/20 0509 11/14/20 0830  GLUCAP 228* 180* 262*    Review of Systems:   Please see the history of present illness. All other systems reviewed and are negative   Past Medical History:  He,  has a past medical history of Cervical adenopathy, Environmental allergies, History of chicken pox, Hyperglycemia, Hypertension, Increased pressure in the eye, Measles, Mumps, and Seasonal allergies.   Surgical History:   Past Surgical History:  Procedure Laterality Date   Cervical Hardware Placement      CHOLECYSTECTOMY     COLONOSCOPY     Q5 yrs   TONSILLECTOMY     WISDOM TOOTH EXTRACTION       Social History:   reports that he has been smoking  cigarettes. He has never used smokeless tobacco. He reports current alcohol use of about 5.0 standard drinks of alcohol per week. He reports that he does not use drugs.   Family History:  His family history includes Alcoholism in his father; Anxiety disorder in his mother; Brain cancer (age of onset: 20) in his mother; Cancer in an other family member; Colon cancer (age of onset: 60) in his father; Hypertension in his brother; Leukemia in his cousin and sister; Liver cancer in his mother; Mental illness in his brother.   Allergies No Known Allergies   Home  Medications  Prior to Admission medications   Medication Sig Start Date End Date Taking? Authorizing Provider  albuterol (VENTOLIN HFA) 108 (90 Base) MCG/ACT inhaler Inhale 1-2 puffs into the lungs every 6 (six) hours as needed for wheezing or shortness of breath. 12/24/19   Brunetta Jeans, PA-C  Coenzyme Q10 (COQ10) 100 MG CAPS Take 1 capsule by mouth daily. Take along with cholesterol medication to help prevent muscle aches 07/04/17   Brunetta Jeans, PA-C  Fluticasone-Salmeterol (ADVAIR DISKUS) 250-50 MCG/DOSE AEPB Inhale 1 puff into the lungs 2 (two) times daily. 12/24/19   Brunetta Jeans, PA-C  telmisartan (MICARDIS) 80 MG tablet Take 1 tablet (80 mg total) by mouth daily. 10/10/20   Midge Minium, MD     Critical care time: NA  Fillmore Bynum D. Kenton Kingfisher, NP-C [facility] Personal contact information can be found on Amion  11/14/2020, 10:51 AM

## 2020-11-14 NOTE — Therapy (Signed)
OT Screening  Patient Details Name: Scott Jensen MRN: [id number] DOB: [birth year]   Screen/Cancelled Treatment:    Reason Eval/Treat Not Completed: OT orders received and chart reviewed. Pt was also observed ambulating in hallway and room with physical therapy today. Pt was screened as well as brief verbal discussion with patient and PT. Patient denies acute OT needs at this time, no needs identified, will sign off.  Carlynn Herald Dayle Sherpa Beth Dixon, OTR/L 11/14/2020, 9:00 AM

## 2020-11-14 NOTE — Progress Notes (Signed)
Pt advised me that he don't wear BIPAP or CPAP at home only neb txs.  He is not wanting to move forward with BIPAP tonight.  Advised patient if he has difficulty breathing and changes his mind to have RN call RT.

## 2020-11-14 NOTE — Evaluation (Signed)
Physical Therapy Evaluation & Discharge Patient Details Name: Scott Jensen MRN: [id number] DOB: 12-Jul-1955 Today's Date: 11/14/2020   History of Present Illness  Pt is a 65 y.o. male admitted 11/13/20 with chest pain and SOB. Workup for acute hypoxic and hypercarbic respiratory failure secondary to COPD exacerbation. PMH includes tobacco use, alcohol use, HTN, HLD, CHF, pulmonary HTN, asthma, COPD.   Clinical Impression  Patient evaluated by Physical Therapy with no further acute PT needs identified. PTA, pt independent, retired from work, lives with son. Today, pt independent with mobility and ADL tasks. Pt required 3L O2 to maintain SpO2 >/88% with activity (see saturations qualifications note). Increased time discussing O2 needs, O2 management, activity recommendations, energy conservation strategies. All education has been completed and the patient has no further questions. Encouraged frequent in-room and hallway ambulation during admission. Acute PT is signing off. Thank you for this referral.     Follow Up Recommendations No PT follow up    Equipment Recommendations  None recommended by PT    Recommendations for Other Services       Precautions / Restrictions Precautions Precautions: Other (comment) Precaution Comments: Watch SpO2 (does not wear O2 baseline) Restrictions Weight Bearing Restrictions: No      Mobility  Bed Mobility Overal bed mobility: Independent                  Transfers Overall transfer level: Independent Equipment used: None                Ambulation/Gait Ambulation/Gait assistance: Independent Gait Distance (Feet): 300 Feet Assistive device: None Gait Pattern/deviations: WFL(Within Functional Limits)   Gait velocity interpretation: 1.31 - 2.62 ft/sec, indicative of limited community ambulator General Gait Details: Steady gait without DME, independent with and without managing O2 tank; intermittent standing rest breaks secondary to  SOB and hypoxia, verbal cues for pursed lip breathing  Stairs            Wheelchair Mobility    Modified Rankin (Stroke Patients Only)       Balance Overall balance assessment: Independent                           High level balance activites: Side stepping;Braiding;Backward walking;Direction changes;Turns;Sudden stops;Head turns High Level Balance Comments: No overt instability or LOB with higher level balance tasks             Pertinent Vitals/Pain Pain Assessment: Faces Faces Pain Scale: Hurts a little bit Pain Location: Generalized Pain Descriptors / Indicators: Discomfort Pain Intervention(s): Monitored during session    Home Living Family/patient expects to be discharged to:: Private residence Living Arrangements: Children Available Help at Discharge: Family;Available PRN/intermittently Type of Home: House Home Access: Stairs to enter Entrance Stairs-Rails: Right Entrance Stairs-Number of Steps: "not many" Home Layout: Two level;Able to live on main level with bedroom/bathroom Home Equipment: Shower seat;Grab bars - tub/shower      Prior Function Level of Independence: Independent         Comments: Retired from work Research officer, political party). Enjoys gardening, has Insurance underwriter        Extremity/Trunk Assessment   Upper Extremity Assessment Upper Extremity Assessment: Overall WFL for tasks assessed    Lower Extremity Assessment Lower Extremity Assessment: Overall WFL for tasks assessed       Communication   Communication: No difficulties  Cognition Arousal/Alertness: Awake/alert Behavior During Therapy: WFL for tasks assessed/performed Overall Cognitive Status: Within Functional Limits for  tasks assessed                                        General Comments General comments (skin integrity, edema, etc.): SpO2 down to 71% on RA with activity - pt left on 3L O2 with SpO2 >/91% resting. Educ on current O2  needs and line management to allow for indep mobility while admitted (RN aware)    Exercises     Assessment/Plan    PT Assessment Patent does not need any further PT services  PT Problem List         PT Treatment Interventions      PT Goals (Current goals can be found in the Care Plan section)  Acute Rehab PT Goals PT Goal Formulation: All assessment and education complete, DC therapy    Frequency     Barriers to discharge        Co-evaluation               AM-PAC PT "6 Clicks" Mobility  Outcome Measure Help needed turning from your back to your side while in a flat bed without using bedrails?: None Help needed moving from lying on your back to sitting on the side of a flat bed without using bedrails?: None Help needed moving to and from a bed to a chair (including a wheelchair)?: None Help needed standing up from a chair using your arms (e.g., wheelchair or bedside chair)?: None Help needed to walk in hospital room?: None Help needed climbing 3-5 steps with a railing? : None 6 Click Score: 24    End of Session Equipment Utilized During Treatment: Oxygen Activity Tolerance: Patient tolerated treatment well Patient left: in chair;with call bell/phone within reach Nurse Communication: Mobility status PT Visit Diagnosis: Other abnormalities of gait and mobility (R26.89)    Time: 6203-5597 PT Time Calculation (min) (ACUTE ONLY): 26 min   Charges:   PT Evaluation $PT Eval Low Complexity: 1 Low PT Treatments $Self Care/Home Management: 8-22      Mabeline Caras, PT, DPT Acute Rehabilitation Services  Pager [phone] Office [place] 11/14/2020, 10:04 AM

## 2020-11-14 NOTE — Progress Notes (Signed)
PROGRESS NOTE    Scott Jensen   ZMO:[id number]  DOB: Mar 13, 1956  PCP: No primary care provider on file.    DOA: 11/13/2020 LOS: 0   Assessment & Plan   Active Problems:   Tobacco abuse disorder   Hypertensive heart disease without CHF   Hyperlipidemia   COPD with acute exacerbation (HCC)   Acute respiratory failure with hypoxia (HCC)   Chronic diastolic CHF (congestive heart failure) (HCC)   Other cirrhosis of liver (HCC)   Acute respiratory failure with hypoxia and hypercapnia By history and chart review, appears patient likely has chronic hypoxia but has never been on O2 at home.  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  Patient reports ongoing intermittent chronic hypoxia for the past 2 years ever since he had COVID in March 2020. Has followed with pulmonology at [facility], but appears has not followed up. -- Supplemental O2, maintain sat above 88%, wean as tolerated --BiPAP as needed --Pulmonology consulted, see their recs  Chest pain -was presenting complaint at [facility]  Troponins negative.  CTA negative for PE but shows significant COPD and emphysematous changes. --Follow-up pending echo, cardiology consult if indicated  COPD with acute exacerbation -with hypoxia and wheezing on admission.  Given steroids and nebs in ED with improvement. --Continue doxycycline --Continue on steroid taper --Scheduled DuoNebs, as needed albuterol --Mucinex --Pulmonary hygiene --Pulmonology consulted  OSA -noncompliant with CPAP as insurance stopped covering it.  Pulmonary hypertension -likely due to poorly managed COPD and OSA.  Hyponatremia -POA, mild.  Monitor BMP.  Follow-up pending urine lites.  Suspected mild volume overload.  Chronic diastolic CHF -echo with EF 65 to 70% and grade 1 diastolic dysfunction.  Presented with mild peripheral edema.  --Started on Lasix on admission 40 mg IV daily, continue.   --Monitor electrolytes and renal function.   --Monitor volume status -I/O's and daily  weights  Type 2 diabetes -uncontrolled, A1c 7.4%.  Sensitive sliding scale NovoLog coverage.  CT findings of cirrhosis -nodular hepatic contour seen on CT obtained in the ED.   Outpatient follow-up.  Follow-up pending hepatitis serologies.  Hyperlipidemia -not on statins.  Lipid panel obtained and unremarkable.  Obesity: Body mass index is 38.72 kg/m.  Complicates overall care and prognosis.  Recommend lifestyle modifications including physical activity and diet for weight loss and overall long-term health.   DVT prophylaxis: SCDs Start: 11/13/20 2330   Diet:  Diet Orders (From admission, onward)     Start     Ordered   11/13/20 2331  Diet heart healthy/carb modified Room service appropriate? Yes; Fluid consistency: Thin  Diet effective now       Question Answer Comment  Diet-HS Snack? Nothing   Room service appropriate? Yes   Fluid consistency: Thin      11/13/20 2332              Code Status: DNR   Brief Narrative / Hospital Course to Date:   No notes on file    Subjective 11/14/20    Patient seen up in recliner this morning.  He reports feeling better.  Tells me his fluctuating oxygen sats at home ever since COVID in March 2020.  He states it does not always drop with exertion will find it low when he is at rest also.  Denies chest pain or shortness of breath today.  Says feeling fine.   Disposition Plan & Communication   Status is: Inpatient  Remains inpatient appropriate because:IV treatments appropriate due to intensity of illness or inability to take  PO  Dispo: The patient is from: Home              Anticipated d/c is to: Home              Patient currently is not medically stable to d/c.   Difficult to place patient No    Consults, Procedures, Significant Events   Consultants:  Pulmonology  Procedures:  Echo 7/19: EF 65 to 70%, moderate LVH, grade 1 diastolic dysfunction, interventricular septal flattening with systole consistent with RV  pressure overload, moderate dilation of both atria.   Antimicrobials:  Anti-infectives (From admission, onward)    Start     Dose/Rate Route Frequency Ordered Stop   11/14/20 0030  doxycycline (VIBRA-TABS) tablet 100 mg        100 mg Oral Every 12 hours 11/13/20 2332 11/18/20 2159         Micro    Objective   Vitals:   11/14/20 0758 11/14/20 1207 11/14/20 1448 11/14/20 1617  BP:  129/71  138/67  Pulse:  87  92  Resp:      Temp:  97.8 F (36.6 C)    TempSrc:  Oral    SpO2: 90% 91% 91% 96%  Weight:      Height:        Intake/Output Summary (Last 24 hours) at 11/14/2020 1724 Last data filed at 11/14/2020 1345 Gross per 24 hour  Intake 240 ml  Output 1700 ml  Net -1460 ml   Filed Weights   11/13/20 1053 11/13/20 2300 11/14/20 0500  Weight: 108.9 kg 115.3 kg 115.5 kg    Physical Exam:  General exam: awake, alert, no acute distress, obese, conversational Respiratory system: Coarse crackles bilaterally right more than left, no wheezes, normal respiratory effort, on 6 L minute nasal cannula oxygen. Cardiovascular system: normal S1/S2, RRR, trace lower extremity edema.   Central nervous system: A&O x4. no gross focal neurologic deficits, normal speech Extremities: moves all, no deformities, normal tone Skin: dry, intact, normal temperature Psychiatry: normal mood, congruent affect, judgement and insight appear normal  Labs   Data Reviewed: I have personally reviewed following labs and imaging studies  CBC: Recent Labs  Lab 11/13/20 1105 11/14/20 0209  WBC 10.3 12.8*  NEUTROABS 7.2 11.5*  HGB 19.5* 18.4*  HCT 60.5* 57.5*  MCV 94.5 94.7  PLT 178 938   Basic Metabolic Panel: Recent Labs  Lab 11/13/20 1105 11/13/20 2313 11/14/20 0209  NA 134* 132* 133*  K 4.6 4.7 4.7  CL 95* 95* 94*  CO2 30 30 32  GLUCOSE 162* 230* 196*  BUN 11 7* 6*  CREATININE 0.70 0.69 0.59*  CALCIUM 9.0 9.1 9.3  MG  --  1.8 1.8  PHOS  --  3.1 2.9   GFR: Estimated Creatinine  Clearance: 112 mL/min (A) (by C-G formula based on SCr of 0.59 mg/dL (L)). Liver Function Tests: Recent Labs  Lab 11/13/20 2313 11/14/20 0209  AST 19 16  ALT 22 20  ALKPHOS 63 64  BILITOT 1.3* 1.2  PROT 7.4 7.1  ALBUMIN 4.0 3.8   No results for input(s): LIPASE, AMYLASE in the last 168 hours. Recent Labs  Lab 11/13/20 2313  AMMONIA 36*   Coagulation Profile: Recent Labs  Lab 11/13/20 2313  INR 1.2   Cardiac Enzymes: Recent Labs  Lab 11/13/20 2313  CKTOTAL 42*   BNP (last 3 results) Recent Labs    12/31/19 1031  PROBNP 151.0*   HbA1C: Recent Labs  11/13/20 2313  HGBA1C 7.4*   CBG: Recent Labs  Lab 11/14/20 0112 11/14/20 0509 11/14/20 0830 11/14/20 1139 11/14/20 1550  GLUCAP 228* 180* 262* 174* 184*   Lipid Profile: Recent Labs    11/13/20 2313  CHOL 142  HDL 53  LDLCALC 82  TRIG 33  CHOLHDL 2.7   Thyroid Function Tests: Recent Labs    11/14/20 0209  TSH 0.449   Anemia Panel: No results for input(s): VITAMINB12, FOLATE, FERRITIN, TIBC, IRON, RETICCTPCT in the last 72 hours. Sepsis Labs: No results for input(s): PROCALCITON, LATICACIDVEN in the last 168 hours.  Recent Results (from the past 240 hour(s))  Resp Panel by RT-PCR (Flu A&B, Covid) Nasopharyngeal Swab     Status: None   Collection Time: 11/13/20 11:05 AM   Specimen: Nasopharyngeal Swab; Nasopharyngeal(NP) swabs in vial transport medium  Result Value Ref Range Status   SARS Coronavirus 2 by RT PCR NEGATIVE NEGATIVE Final    Comment: (NOTE) SARS-CoV-2 target nucleic acids are NOT DETECTED.  The SARS-CoV-2 RNA is generally detectable in upper respiratory specimens during the acute phase of infection. The lowest concentration of SARS-CoV-2 viral copies this assay can detect is 138 copies/mL. A negative result does not preclude SARS-Cov-2 infection and should not be used as the sole basis for treatment or other patient management decisions. A negative result may occur with   improper specimen collection/handling, submission of specimen other than nasopharyngeal swab, presence of viral mutation(s) within the areas targeted by this assay, and inadequate number of viral copies(<138 copies/mL). A negative result must be combined with clinical observations, patient history, and epidemiological information. The expected result is Negative.  Fact Sheet for Patients:  EntrepreneurPulse.com.au  Fact Sheet for Healthcare Providers:  IncredibleEmployment.be  This test is no t yet approved or cleared by the [place] FDA and  has been authorized for detection and/or diagnosis of SARS-CoV-2 by FDA under an Emergency Use Authorization (EUA). This EUA will remain  in effect (meaning this test can be used) for the duration of the COVID-19 declaration under Section 564(b)(1) of the Act, 21 U.S.C.section 360bbb-3(b)(1), unless the authorization is terminated  or revoked sooner.       Influenza A by PCR NEGATIVE NEGATIVE Final   Influenza B by PCR NEGATIVE NEGATIVE Final    Comment: (NOTE) The Xpert Xpress SARS-CoV-2/FLU/RSV plus assay is intended as an aid in the diagnosis of influenza from Nasopharyngeal swab specimens and should not be used as a sole basis for treatment. Nasal washings and aspirates are unacceptable for Xpert Xpress SARS-CoV-2/FLU/RSV testing.  Fact Sheet for Patients: EntrepreneurPulse.com.au  Fact Sheet for Healthcare Providers: IncredibleEmployment.be  This test is not yet approved or cleared by the [place] FDA and has been authorized for detection and/or diagnosis of SARS-CoV-2 by FDA under an Emergency Use Authorization (EUA). This EUA will remain in effect (meaning this test can be used) for the duration of the COVID-19 declaration under Section 564(b)(1) of the Act, 21 U.S.C. section 360bbb-3(b)(1), unless the authorization is terminated  or revoked.  Performed at [facility], [address] [place], [place] [address]   MRSA Next Gen by PCR, Nasal     Status: None   Collection Time: 11/13/20 11:27 PM   Specimen: Nasal Mucosa; Nasal Swab  Result Value Ref Range Status   MRSA by PCR Next Gen NOT DETECTED NOT DETECTED Final    Comment: (NOTE) The GeneXpert MRSA Assay (FDA approved for NASAL specimens only), is one component  of a comprehensive MRSA colonization surveillance program. It is not intended to diagnose MRSA infection nor to guide or monitor treatment for MRSA infections. Test performance is not FDA approved in patients less than 83 years old. Performed at [facility], [place] [address], [place], [place] [address]       Imaging Studies   DG Chest Portable 1 View  Result Date: 11/13/2020 CLINICAL DATA:  65 year old male woke with chest pain radiating to the neck. Hypoxia on room air. EXAM: PORTABLE CHEST 1 VIEW COMPARISON:  Chest radiograph 11/06/2018 and earlier. FINDINGS: Portable AP semi upright view at 1104 hours. Stable mild cardiomegaly and mediastinal contours. Visualized tracheal air column is within normal limits. Mildly lower lung volumes. Allowing for portable technique the lungs are clear. No pneumothorax. Chronic cervical ACDF. No acute osseous abnormality identified. IMPRESSION: No acute cardiopulmonary abnormality. Electronically Signed   By: Genevie Ann M.D.   On: 11/13/2020 11:43   ECHOCARDIOGRAM COMPLETE  Result Date: 11/14/2020    ECHOCARDIOGRAM REPORT   Patient Name:   Scott Jensen Date of Exam: 11/14/2020 Medical Rec #:  [id number]      Height:       68.0 in Accession #:    [id number]     Weight:       254.6 lb Date of Birth:  04-02-1956       BSA:          2.265 m Patient Age:    21 years       BP:           131/72 mmHg Patient Gender: M              HR:           92 bpm. Exam Location:  Inpatient Procedure: 2D Echo, Cardiac Doppler, Color Doppler and Intracardiac             Opacification Agent Indications:    CHF - Acute Disastolic  History:        Patient has prior history of Echocardiogram examinations, most                 recent 01/18/2020. CAD, COPD and Pulmonary HTN,                 Arrythmias:Tachycardia, Signs/Symptoms:Shortness of Breath; Risk                 Factors:Hypertension, Sleep Apnea, Former Smoker and                 Dyslipidemia. Cardiomegaly.  Sonographer:    Clayton Lefort RDCS (AE) Referring Phys: [id number] ANASTASSIA DOUTOVA  Sonographer Comments: Technically challenging study due to limited acoustic windows, Technically difficult study due to poor echo windows, suboptimal parasternal window, suboptimal apical window and patient is morbidly obese. Image acquisition challenging due to patient body habitus and Image acquisition challenging due to COPD. IMPRESSIONS  1. Left ventricular ejection fraction, by estimation, is 65 to 70%. The left ventricle has normal function. The left ventricle has no regional wall motion abnormalities. There is moderate concentric left ventricular hypertrophy. Left ventricular diastolic parameters are consistent with Grade I diastolic dysfunction (impaired relaxation). There is the interventricular septum is flattened in systole, consistent with right ventricular pressure overload.  2. Right ventricular systolic function is mildly reduced. The right ventricular size is mildly enlarged. Mildly increased right ventricular wall thickness.  3. Left atrial size was mild to moderately dilated.  4. Right atrial size was moderately  dilated.  5. The mitral valve is normal in structure. No evidence of mitral valve regurgitation.  6. The aortic valve is tricuspid. There is mild calcification of the aortic valve. Aortic valve regurgitation is not visualized. Mild aortic valve sclerosis is present, with no evidence of aortic valve stenosis.  7. The inferior vena cava is dilated in size with <50% respiratory variability, suggesting right atrial pressure of  15 mmHg. Comparison(s): No significant change from prior study. Prior images reviewed side by side. Although the PA pressure could not be estimated due to the absence of tricuspid insufficincy, 2D findings suggest chronic severe pulmonary artery hypertension. FINDINGS  Left Ventricle: Left ventricular ejection fraction, by estimation, is 65 to 70%. The left ventricle has normal function. The left ventricle has no regional wall motion abnormalities. Definity contrast agent was given IV to delineate the left ventricular  endocardial borders. The left ventricular internal cavity size was normal in size. There is moderate concentric left ventricular hypertrophy. The interventricular septum is flattened in systole, consistent with right ventricular pressure overload. Left ventricular diastolic parameters are consistent with Grade I diastolic dysfunction (impaired relaxation). Indeterminate filling pressures. Right Ventricle: The right ventricular size is mildly enlarged. Mildly increased right ventricular wall thickness. Right ventricular systolic function is mildly reduced. Left Atrium: Left atrial size was mild to moderately dilated. Right Atrium: Right atrial size was moderately dilated. Pericardium: There is no evidence of pericardial effusion. Mitral Valve: The mitral valve is normal in structure. No evidence of mitral valve regurgitation. MV peak gradient, 4.8 mmHg. The mean mitral valve gradient is 3.0 mmHg. Tricuspid Valve: The tricuspid valve is normal in structure. Tricuspid valve regurgitation is not demonstrated. Aortic Valve: The aortic valve is tricuspid. There is mild calcification of the aortic valve. Aortic valve regurgitation is not visualized. Mild aortic valve sclerosis is present, with no evidence of aortic valve stenosis. Aortic valve mean gradient measures 3.0 mmHg. Aortic valve peak gradient measures 5.9 mmHg. Aortic valve area, by VTI measures 4.48 cm. Pulmonic Valve: The pulmonic valve was  grossly normal. Pulmonic valve regurgitation is not visualized. No evidence of pulmonic stenosis. Aorta: The aortic root is normal in size and structure. Venous: The inferior vena cava is dilated in size with less than 50% respiratory variability, suggesting right atrial pressure of 15 mmHg. IAS/Shunts: No atrial level shunt detected by color flow Doppler.  LEFT VENTRICLE PLAX 2D LVIDd:         4.90 cm  Diastology LVIDs:         3.00 cm  LV e' medial:    6.64 cm/s LV PW:         1.80 cm  LV E/e' medial:  12.4 LV IVS:        1.60 cm  LV e' lateral:   7.18 cm/s LVOT diam:     2.20 cm  LV E/e' lateral: 11.5 LV SV:         83 LV SV Index:   37 LVOT Area:     3.80 cm  RIGHT VENTRICLE             IVC RV Basal diam:  4.00 cm     IVC diam: 2.70 cm RV Mid diam:    4.40 cm RV S prime:     12.60 cm/s TAPSE (M-mode): 2.1 cm LEFT ATRIUM             Index       RIGHT ATRIUM  Index LA diam:        4.30 cm 1.90 cm/m  RA Area:     25.50 cm LA Vol (A2C):   65.6 ml 28.97 ml/m RA Volume:   91.60 ml  40.45 ml/m LA Vol (A4C):   63.3 ml 27.95 ml/m LA Biplane Vol: 66.7 ml 29.45 ml/m  AORTIC VALVE AV Area (Vmax):    4.71 cm AV Area (Vmean):   4.27 cm AV Area (VTI):     4.48 cm AV Vmax:           121.00 cm/s AV Vmean:          82.700 cm/s AV VTI:            0.185 m AV Peak Grad:      5.9 mmHg AV Mean Grad:      3.0 mmHg LVOT Vmax:         150.00 cm/s LVOT Vmean:        93.000 cm/s LVOT VTI:          0.218 m LVOT/AV VTI ratio: 1.18  AORTA Ao Root diam: 3.30 cm MITRAL VALVE MV Area (PHT): 3.83 cm     SHUNTS MV Area VTI:   2.67 cm     Systemic VTI:  0.22 m MV Peak grad:  4.8 mmHg     Systemic Diam: 2.20 cm MV Mean grad:  3.0 mmHg MV Vmax:       1.09 m/s MV Vmean:      84.7 cm/s MV Decel Time: 198 msec MV E velocity: 82.50 cm/s MV A velocity: 108.00 cm/s MV E/A ratio:  0.76 Mihai Croitoru MD Electronically signed by Sanda Klein MD Signature Date/Time: 11/14/2020/9:53:49 AM    Final    CT Angio Chest/Abd/Pel for  Dissection W and/or Wo Contrast  Result Date: 11/13/2020 CLINICAL DATA:  Chest pain radiating to the neck. Suspect aortic dissection. EXAM: CT ANGIOGRAPHY CHEST, ABDOMEN AND PELVIS TECHNIQUE: Non-contrast CT of the chest was initially obtained. Multidetector CT imaging through the chest, abdomen and pelvis was performed using the standard protocol during bolus administration of intravenous contrast. Multiplanar reconstructed images and MIPs were obtained and reviewed to evaluate the vascular anatomy. CONTRAST:  168mL OMNIPAQUE IOHEXOL 350 MG/ML SOLN COMPARISON:  CT angiography chest 07/23/2018 FINDINGS: CTA CHEST FINDINGS Cardiovascular: Heart size is within normal limits. Extensive coronary calcifications. No aortic intramural hematoma identified on noncontrast images. Thoracic aorta is normal in caliber. No significant stenosis. Mediastinum/Nodes: Nonenlarged lymph nodes seen throughout the mediastinum. No enlarged axillary or hilar lymph nodes. Lungs/Pleura: Lungs are clear. No pleural effusion or pneumothorax. Musculoskeletal: No chest wall abnormality. No acute or significant osseous findings. Review of the MIP images confirms the above findings. CTA ABDOMEN AND PELVIS FINDINGS VASCULAR Aorta: Scattered atheromatous plaque without aneurysmal dilatation or significant stenosis. Celiac: Patent without evidence of aneurysm, dissection, vasculitis or significant stenosis. SMA: Patent without evidence of aneurysm, dissection, vasculitis or significant stenosis. Renals: Both renal arteries are patent without evidence of aneurysm, dissection, vasculitis, fibromuscular dysplasia or significant stenosis. IMA: Patent. Inflow: Scattered calcified atheromatous plaque of the common, internal common external iliac arteries without high-grade stenosis. 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 The right profunda femoris artery originates above the  level of the hip. No significant abnormality of the visualized left outflow arteries. Veins: No obvious venous abnormality within the limitations of this arterial phase study. Review of the MIP images confirms the above findings. NON-VASCULAR  Hepatobiliary: Postsurgical changes of cholecystectomy are seen. Hepatic contours appear nodular, suspicious for cirrhosis. No significant biliary dilatation. Pancreas: Unremarkable. No pancreatic ductal dilatation or surrounding inflammatory changes. Spleen: Normal in size without focal abnormality. Adrenals/Urinary Tract: Adrenal glands are normal. 1.9 cm simple cyst present in the lower pole of the right kidney. Kidneys, ureters, and bladder otherwise unremarkable. Stomach/Bowel: No bowel dilatation to indicate ileus or obstruction. Appendix is normal. Extensive diverticulosis of the descending and sigmoid colon. Lymphatic: No enlarged abdominal or pelvic lymph nodes. Multiple nonenlarged lymph nodes are seen in the retroperitoneum. Insert most likely reactive. Reproductive: Mildly enlarged prostate. Other: No abdominal wall hernia or abnormality. No abdominopelvic ascites. Musculoskeletal: No acute or significant osseous findings. Review of the MIP images confirms the above findings. IMPRESSION: 1. No acute abnormality of the chest, abdomen, or pelvis. No aortic dissection. 2. 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 3. Nodular hepatic contours are suspicious for cirrhosis. 4. Distal colonic diverticulosis without evidence of acute diverticulitis. Electronically Signed   By: Miachel Roux M.D.   On: 11/13/2020 13:50     Medications   Scheduled Meds:  albuterol  5 mg/hr Nebulization Once   doxycycline  100 mg Oral D40C   folic acid  1 mg Oral Daily   furosemide  40 mg Intravenous Daily   insulin aspart  0-15 Units Subcutaneous Q4H   ipratropium-albuterol  3 mL Nebulization Q6H   mometasone-formoterol  2 puff Inhalation  BID   [START ON 11/15/2020] predniSONE  40 mg Oral Q breakfast   sodium chloride flush  3 mL Intravenous Q12H   spironolactone  12.5 mg Oral Daily   thiamine  100 mg Oral Daily   Continuous Infusions:  sodium chloride         LOS: 0 days    Time spent: 30 minutes    Ezekiel Slocumb, DO [facility]  11/14/2020, 5:24 PM      If 7PM-7AM, please contact night-coverage. How to contact the [facility] Attending or Consulting provider [place] or covering provider during after hours [place], for this patient?    Check the care team in [facility] and look for a) attending/consulting [facility] provider listed and b) the [facility] team listed Log into [email or web address] and use [place] universal password to access. If you do not have the password, please contact the hospital operator. Locate the [facility] provider you are looking for under [facility] and page to a number that you can be directly reached. If you still have difficulty reaching the provider, please page the [facility] (Director on Call) for the Hospitalists listed on amion for assistance.

## 2020-11-15 ENCOUNTER — Other Ambulatory Visit (HOSPITAL_COMMUNITY): Payer: Self-pay

## 2020-11-15 DIAGNOSIS — J441 Chronic obstructive pulmonary disease with (acute) exacerbation: Secondary | ICD-10-CM | POA: Diagnosis not present

## 2020-11-15 DIAGNOSIS — I480 Paroxysmal atrial fibrillation: Secondary | ICD-10-CM

## 2020-11-15 DIAGNOSIS — J9601 Acute respiratory failure with hypoxia: Secondary | ICD-10-CM | POA: Diagnosis not present

## 2020-11-15 DIAGNOSIS — I5033 Acute on chronic diastolic (congestive) heart failure: Secondary | ICD-10-CM

## 2020-11-15 DIAGNOSIS — I214 Non-ST elevation (NSTEMI) myocardial infarction: Secondary | ICD-10-CM

## 2020-11-15 LAB — HEPARIN LEVEL (UNFRACTIONATED)
Heparin Unfractionated: <0.1 IU/mL — ABNORMAL LOW (ref 0.30–0.70)
Heparin Unfractionated: 0.1 IU/mL — ABNORMAL LOW (ref 0.30–0.70)

## 2020-11-15 LAB — TROPONIN I (HIGH SENSITIVITY): Troponin I (High Sensitivity): 65 ng/L — ABNORMAL HIGH (ref <18)

## 2020-11-15 LAB — GLUCOSE, CAPILLARY
Glucose-Capillary: 167 mg/dL — ABNORMAL HIGH (ref 70–99)
Glucose-Capillary: 148 mg/dL — ABNORMAL HIGH (ref 70–99)
Glucose-Capillary: 204 mg/dL — ABNORMAL HIGH (ref 70–99)
Glucose-Capillary: 178 mg/dL — ABNORMAL HIGH (ref 70–99)
Glucose-Capillary: 264 mg/dL — ABNORMAL HIGH (ref 70–99)

## 2020-11-15 LAB — HEMOGLOBIN AND HEMATOCRIT, BLOOD
HCT: 53.9 % — ABNORMAL HIGH (ref 39.0–52.0)
Hemoglobin: 17.6 g/dL — ABNORMAL HIGH (ref 13.0–17.0)

## 2020-11-15 MED ORDER — IPRATROPIUM-ALBUTEROL 0.5-2.5 (3) MG/3ML IN SOLN
3.0000 mL | Freq: Three times a day (TID) | RESPIRATORY_TRACT | Status: DC
  Administered 2020-11-15 – 2020-11-17 (×7): 3 mL via RESPIRATORY_TRACT
  Filled 2020-11-15 (×8): qty 3

## 2020-11-15 MED ORDER — SODIUM CHLORIDE 0.9 % IV SOLN: 250.0000 mL | INTRAVENOUS | Status: DC | PRN

## 2020-11-15 MED ORDER — HEPARIN BOLUS VIA INFUSION
2000.0000 [IU] | Freq: Once | INTRAVENOUS | Status: AC
  Administered 2020-11-15: 2000 [IU] via INTRAVENOUS
  Filled 2020-11-15: qty 2000

## 2020-11-15 MED ORDER — ATORVASTATIN CALCIUM 40 MG PO TABS
40.0000 mg | ORAL_TABLET | Freq: Every day | ORAL | Status: DC
  Administered 2020-11-15: 40 mg via ORAL
  Filled 2020-11-15: qty 1

## 2020-11-15 MED ORDER — SODIUM CHLORIDE 0.9% FLUSH: 3.0000 mL | INTRAVENOUS | Status: DC | PRN

## 2020-11-15 MED ORDER — IPRATROPIUM-ALBUTEROL 0.5-2.5 (3) MG/3ML IN SOLN: 3.0000 mL | Freq: Three times a day (TID) | RESPIRATORY_TRACT | Status: DC

## 2020-11-15 MED ORDER — SODIUM CHLORIDE 0.9 % IV SOLN: INTRAVENOUS | Status: DC

## 2020-11-15 MED ORDER — HEPARIN BOLUS VIA INFUSION
2500.0000 [IU] | Freq: Once | INTRAVENOUS | Status: AC
  Administered 2020-11-15: 2500 [IU] via INTRAVENOUS
  Filled 2020-11-15: qty 2500

## 2020-11-15 MED ORDER — SPIRONOLACTONE 25 MG PO TABS
25.0000 mg | ORAL_TABLET | Freq: Every day | ORAL | Status: DC
  Administered 2020-11-16 – 2020-11-18 (×3): 25 mg via ORAL
  Filled 2020-11-15 (×3): qty 1

## 2020-11-15 MED ORDER — ASPIRIN 81 MG PO CHEW
81.0000 mg | CHEWABLE_TABLET | ORAL | Status: AC
Start: 2020-11-16 — End: 2020-11-16
  Administered 2020-11-16: 81 mg via ORAL
  Filled 2020-11-15: qty 1

## 2020-11-15 MED ORDER — HEPARIN BOLUS VIA INFUSION
5000.0000 [IU] | Freq: Once | INTRAVENOUS | Status: AC
  Administered 2020-11-15: 5000 [IU] via INTRAVENOUS
  Filled 2020-11-15: qty 5000

## 2020-11-15 MED ORDER — SODIUM CHLORIDE 0.9% FLUSH
3.0000 mL | Freq: Two times a day (BID) | INTRAVENOUS | Status: DC
Start: 2020-11-15 — End: 2020-11-18
  Administered 2020-11-15: 3 mL via INTRAVENOUS

## 2020-11-15 MED ORDER — FUROSEMIDE 10 MG/ML IJ SOLN
60.0000 mg | Freq: Two times a day (BID) | INTRAMUSCULAR | Status: DC
  Administered 2020-11-15: 60 mg via INTRAVENOUS
  Filled 2020-11-15: qty 6

## 2020-11-15 MED ORDER — ASPIRIN EC 81 MG PO TBEC
81.0000 mg | DELAYED_RELEASE_TABLET | Freq: Every day | ORAL | Status: DC
  Administered 2020-11-15 – 2020-11-18 (×3): 81 mg via ORAL
  Filled 2020-11-15 (×3): qty 1

## 2020-11-15 MED ORDER — HEPARIN (PORCINE) 25000 UT/250ML-% IV SOLN
2100.0000 [IU]/h | INTRAVENOUS | Status: DC
  Administered 2020-11-15: 1400 [IU]/h via INTRAVENOUS
  Administered 2020-11-15: 1700 [IU]/h via INTRAVENOUS
  Administered 2020-11-16: 2100 [IU]/h via INTRAVENOUS
  Filled 2020-11-15 (×2): qty 250

## 2020-11-15 NOTE — Plan of Care (Signed)

## 2020-11-15 NOTE — Progress Notes (Signed)
Lab notified this nurse that Trop increased again to 8. PT chest pain free with no c/o at this time. Heparin IV infusion and bolus initiated. MD notified per [facility] page.

## 2020-11-15 NOTE — TOC Benefit Eligibility Note (Signed)
Patient Teacher, English as a foreign language completed.    The patient is currently admitted and upon discharge could be taking Eliquis 5 mg.  Non Formulary  The patient is currently admitted and upon discharge could be taking Xarelto 20 mg.  The current 30 day co-pay is, $442.72 due to a $418.49 deductible remaining.   The patient is insured through [place], [place] Patient Advocate Specialist [place] Team Direct Number: [phone]  Fax: [phone]

## 2020-11-15 NOTE — Progress Notes (Signed)
PROGRESS NOTE    Scott Jensen  SWN:[id number] DOB: 1955-12-15 DOA: 11/13/2020 PCP: No primary care provider on file.    Chief Complaint  Patient presents with   Shortness of Breath   Chest Pain    Brief Narrative:   History of COPD (light smoker), OSA not on CPAP, hypertension, chronic polycythemia, pulmonary hypertension , diastolic CHF presented with chest pain short of breath, found to be hypoxic O2 sat 82% on room air on arrival  Subjective:  He developed A. fib last night, he was started on Cardizem drip, he also had chest pain with troponin trending up, he is started on heparin drip last night Currently, He is sitting up in chair, report chest pain has resolved He is currently on 5 L oxygen, denies short of breath at rest , reported lower extremity edema has much improved He is aware that he is going to have cardiac cath tomorrow  Assessment & Plan:   Active Problems:   Tobacco abuse disorder   Hypertensive heart disease without CHF   Hyperlipidemia   COPD with acute exacerbation (HCC)   Acute respiratory failure with hypoxia (HCC)   Chronic diastolic CHF (congestive heart failure) (HCC)   Other cirrhosis of liver (HCC)   Acute hypoxic respiratory failure -Patient report he was recommended to use oxygen at home but he declined in the past, likely has component of chronic hypoxic respiratory failure, as evidenced by chronic polycythemia -Currently on 5 L oxygen, acute short of breath and hypoxic respiratory failure likely from mild COPD exacerbation, acute on chronic diastolic CHF A. fib could also contribute to short of breath -will need home O2 eval once cardiac issue improves, he likely will need to be discharged on home O2  COPD exacerbation Currently on steroid, DuoNeb, doxycycline Mild scattered wheezing on exam Chest x-ray no acute infiltrate  Chest pain, non-STEMI, Currently on heparin drip, ASA, Lipitor New onset A. fib/a flutter, -Currently on heparin  drip and Cardizem drip Acute on chronic diastolic CHF, Currently on IV Lasix Management per cardiology  Diabetes, uncontrolled, with hyperglycemia Does not appear to be on medication at home A1c 7.4% Per cardiology plan to start SGLT T2 inhibitor prior to discharge Currently on insulin sliding scale, may not need insulin once taper off steroid, will monitor  Hypertension Appears on telmisartan and Lasix at home Currently on IV Lasix and spironolactone per cardiology recommendation    Body mass index is 37.91 kg/m.Marland Kitchen Reported history of sleep apnea, he report not ready for CPAP, he would prefer to home oxygen first       Unresulted Labs (From admission, onward)     Start     Ordered   11/16/20 0500  Heparin level (unfractionated)  Daily,   R     Question:  Specimen collection method  Answer:  Lab=Lab collect   11/15/20 0105   11/16/20 0500  CBC  Daily,   R     Question:  Specimen collection method  Answer:  Lab=Lab collect   11/15/20 0105   11/15/20 1900  Heparin level (unfractionated)  Once-Timed,   TIMED       Question:  Specimen collection method  Answer:  Lab=Lab collect   11/15/20 1239   11/13/20 2327  Expectorated Sputum Assessment w Gram Stain, Rflx to Resp Cult  (COPD / Pneumonia / Cellulitis / Lower Extremity Wound)  Once,   R        11/13/20 2332   Signed and Held  Protime-INR  Once,  STAT       Question:  Specimen collection method  Answer:  Lab=Lab collect   Signed and Held              DVT prophylaxis: SCDs Start: 11/13/20 2330   Code Status: DNR Family Communication: Patient Disposition:   Status is: Inpatient  Dispo: The patient is from: Home              Anticipated d/c is to: Home              Anticipated d/c date is: Not ready to discharge               Consultants:  Cardiology Pulmonary  Procedures:  Plan for cardiac cath tomorrow  Antimicrobials:    Anti-infectives (From admission, onward)    Start     Dose/Rate Route  Frequency Ordered Stop   11/14/20 0030  doxycycline (VIBRA-TABS) tablet 100 mg        100 mg Oral Every 12 hours 11/13/20 2332 11/18/20 2159          Objective: Vitals:   11/15/20 0802 11/15/20 1301 11/15/20 1330 11/15/20 1406  BP: (!) 113/57  123/66   Pulse: 77  77 79  Resp:      Temp: (!) 97.4 F (36.3 C)     TempSrc: Oral     SpO2: 91% 92% 95% 95%  Weight:      Height:        Intake/Output Summary (Last 24 hours) at 11/15/2020 1630 Last data filed at 11/15/2020 1300 Gross per 24 hour  Intake 893.2 ml  Output 625 ml  Net 268.2 ml   Filed Weights   11/13/20 2300 11/14/20 0500 11/15/20 0514  Weight: 115.3 kg 115.5 kg 113.1 kg    Examination:  General exam: calm, NAD Respiratory system: Mild scattered wheezing , no rales , no rhonchi  Respiratory effort normal. Cardiovascular system: S1 & S2 heard, in aflutter Gastrointestinal system: Abdomen is nondistended, soft and nontender. No organomegaly or masses felt. Normal bowel sounds heard. Central nervous system: Alert and oriented. No focal neurological deficits. Extremities: Symmetric 5 x 5 power.  Trace Lower extremity edema  Skin: No rashes, lesions or ulcers Psychiatry: Judgement and insight appear normal. Mood & affect appropriate.     Data Reviewed: I have personally reviewed following labs and imaging studies  CBC: Recent Labs  Lab 11/13/20 1105 11/14/20 0209  WBC 10.3 12.8*  NEUTROABS 7.2 11.5*  HGB 19.5* 18.4*  HCT 60.5* 57.5*  MCV 94.5 94.7  PLT 178 258    Basic Metabolic Panel: Recent Labs  Lab 11/13/20 1105 11/13/20 2313 11/14/20 0209  NA 134* 132* 133*  K 4.6 4.7 4.7  CL 95* 95* 94*  CO2 30 30 32  GLUCOSE 162* 230* 196*  BUN 11 7* 6*  CREATININE 0.70 0.69 0.59*  CALCIUM 9.0 9.1 9.3  MG  --  1.8 1.8  PHOS  --  3.1 2.9    GFR: Estimated Creatinine Clearance: 110.9 mL/min (A) (by C-G formula based on SCr of 0.59 mg/dL (L)).  Liver Function Tests: Recent Labs  Lab  11/13/20 2313 11/14/20 0209  AST 19 16  ALT 22 20  ALKPHOS 63 64  BILITOT 1.3* 1.2  PROT 7.4 7.1  ALBUMIN 4.0 3.8    CBG: Recent Labs  Lab 11/14/20 2135 11/15/20 0553 11/15/20 0730 11/15/20 1127 11/15/20 1623  GLUCAP 214* 167* 148* 204* 178*     Recent Results (from the  past 240 hour(s))  Resp Panel by RT-PCR (Flu A&B, Covid) Nasopharyngeal Swab     Status: None   Collection Time: 11/13/20 11:05 AM   Specimen: Nasopharyngeal Swab; Nasopharyngeal(NP) swabs in vial transport medium  Result Value Ref Range Status   SARS Coronavirus 2 by RT PCR NEGATIVE NEGATIVE Final    Comment: (NOTE) SARS-CoV-2 target nucleic acids are NOT DETECTED.  The SARS-CoV-2 RNA is generally detectable in upper respiratory specimens during the acute phase of infection. The lowest concentration of SARS-CoV-2 viral copies this assay can detect is 138 copies/mL. A negative result does not preclude SARS-Cov-2 infection and should not be used as the sole basis for treatment or other patient management decisions. A negative result may occur with  improper specimen collection/handling, submission of specimen other than nasopharyngeal swab, presence of viral mutation(s) within the areas targeted by this assay, and inadequate number of viral copies(<138 copies/mL). A negative result must be combined with clinical observations, patient history, and epidemiological information. The expected result is Negative.  Fact Sheet for Patients:  EntrepreneurPulse.com.au  Fact Sheet for Healthcare Providers:  IncredibleEmployment.be  This test is no t yet approved or cleared by the [place] FDA and  has been authorized for detection and/or diagnosis of SARS-CoV-2 by FDA under an Emergency Use Authorization (EUA). This EUA will remain  in effect (meaning this test can be used) for the duration of the COVID-19 declaration under Section 564(b)(1) of the Act,  21 U.S.C.section 360bbb-3(b)(1), unless the authorization is terminated  or revoked sooner.       Influenza A by PCR NEGATIVE NEGATIVE Final   Influenza B by PCR NEGATIVE NEGATIVE Final    Comment: (NOTE) The Xpert Xpress SARS-CoV-2/FLU/RSV plus assay is intended as an aid in the diagnosis of influenza from Nasopharyngeal swab specimens and should not be used as a sole basis for treatment. Nasal washings and aspirates are unacceptable for Xpert Xpress SARS-CoV-2/FLU/RSV testing.  Fact Sheet for Patients: EntrepreneurPulse.com.au  Fact Sheet for Healthcare Providers: IncredibleEmployment.be  This test is not yet approved or cleared by the [place] FDA and has been authorized for detection and/or diagnosis of SARS-CoV-2 by FDA under an Emergency Use Authorization (EUA). This EUA will remain in effect (meaning this test can be used) for the duration of the COVID-19 declaration under Section 564(b)(1) of the Act, 21 U.S.C. section 360bbb-3(b)(1), unless the authorization is terminated or revoked.  Performed at [facility], [address] [place], [place] [address]   MRSA Next Gen by PCR, Nasal     Status: None   Collection Time: 11/13/20 11:27 PM   Specimen: Nasal Mucosa; Nasal Swab  Result Value Ref Range Status   MRSA by PCR Next Gen NOT DETECTED NOT DETECTED Final    Comment: (NOTE) The GeneXpert MRSA Assay (FDA approved for NASAL specimens only), is one component of a comprehensive MRSA colonization surveillance program. It is not intended to diagnose MRSA infection nor to guide or monitor treatment for MRSA infections. Test performance is not FDA approved in patients less than 55 years old. Performed at [facility], [place] [address], [place] [employer] [place], [place] [address]          Radiology Studies: ECHOCARDIOGRAM COMPLETE  Result Date: 11/14/2020    ECHOCARDIOGRAM REPORT   Patient Name:   KAESEN South Date of  Exam: 11/14/2020 Medical Rec #:  [id number]      Height:       68.0 in Accession #:  [id number]     Weight:       254.6 lb Date of Birth:  31-Aug-1955       BSA:          2.265 m Patient Age:    65 years       BP:           131/72 mmHg Patient Gender: M              HR:           92 bpm. Exam Location:  Inpatient Procedure: 2D Echo, Cardiac Doppler, Color Doppler and Intracardiac            Opacification Agent Indications:    CHF - Acute Disastolic  History:        Patient has prior history of Echocardiogram examinations, most                 recent 01/18/2020. CAD, COPD and Pulmonary HTN,                 Arrythmias:Tachycardia, Signs/Symptoms:Shortness of Breath; Risk                 Factors:Hypertension, Sleep Apnea, Former Smoker and                 Dyslipidemia. Cardiomegaly.  Sonographer:    Clayton Lefort RDCS (AE) Referring Phys: [id number] ANASTASSIA DOUTOVA  Sonographer Comments: Technically challenging study due to limited acoustic windows, Technically difficult study due to poor echo windows, suboptimal parasternal window, suboptimal apical window and patient is morbidly obese. Image acquisition challenging due to patient body habitus and Image acquisition challenging due to COPD. IMPRESSIONS  1. Left ventricular ejection fraction, by estimation, is 65 to 70%. The left ventricle has normal function. The left ventricle has no regional wall motion abnormalities. There is moderate concentric left ventricular hypertrophy. Left ventricular diastolic parameters are consistent with Grade I diastolic dysfunction (impaired relaxation). There is the interventricular septum is flattened in systole, consistent with right ventricular pressure overload.  2. Right ventricular systolic function is mildly reduced. The right ventricular size is mildly enlarged. Mildly increased right ventricular wall thickness.  3. Left atrial size was mild to moderately dilated.  4. Right atrial size was moderately dilated.  5. The mitral valve is  normal in structure. No evidence of mitral valve regurgitation.  6. The aortic valve is tricuspid. There is mild calcification of the aortic valve. Aortic valve regurgitation is not visualized. Mild aortic valve sclerosis is present, with no evidence of aortic valve stenosis.  7. The inferior vena cava is dilated in size with <50% respiratory variability, suggesting right atrial pressure of 15 mmHg. Comparison(s): No significant change from prior study. Prior images reviewed side by side. Although the PA pressure could not be estimated due to the absence of tricuspid insufficincy, 2D findings suggest chronic severe pulmonary artery hypertension. FINDINGS  Left Ventricle: Left ventricular ejection fraction, by estimation, is 65 to 70%. The left ventricle has normal function. The left ventricle has no regional wall motion abnormalities. Definity contrast agent was given IV to delineate the left ventricular  endocardial borders. The left ventricular internal cavity size was normal in size. There is moderate concentric left ventricular hypertrophy. The interventricular septum is flattened in systole, consistent with right ventricular pressure overload. Left ventricular diastolic parameters are consistent with Grade I diastolic dysfunction (impaired relaxation). Indeterminate filling pressures. Right Ventricle: The right ventricular size is mildly enlarged. Mildly increased right ventricular wall thickness.  Right ventricular systolic function is mildly reduced. Left Atrium: Left atrial size was mild to moderately dilated. Right Atrium: Right atrial size was moderately dilated. Pericardium: There is no evidence of pericardial effusion. Mitral Valve: The mitral valve is normal in structure. No evidence of mitral valve regurgitation. MV peak gradient, 4.8 mmHg. The mean mitral valve gradient is 3.0 mmHg. Tricuspid Valve: The tricuspid valve is normal in structure. Tricuspid valve regurgitation is not demonstrated. Aortic  Valve: The aortic valve is tricuspid. There is mild calcification of the aortic valve. Aortic valve regurgitation is not visualized. Mild aortic valve sclerosis is present, with no evidence of aortic valve stenosis. Aortic valve mean gradient measures 3.0 mmHg. Aortic valve peak gradient measures 5.9 mmHg. Aortic valve area, by VTI measures 4.48 cm. Pulmonic Valve: The pulmonic valve was grossly normal. Pulmonic valve regurgitation is not visualized. No evidence of pulmonic stenosis. Aorta: The aortic root is normal in size and structure. Venous: The inferior vena cava is dilated in size with less than 50% respiratory variability, suggesting right atrial pressure of 15 mmHg. IAS/Shunts: No atrial level shunt detected by color flow Doppler.  LEFT VENTRICLE PLAX 2D LVIDd:         4.90 cm  Diastology LVIDs:         3.00 cm  LV e' medial:    6.64 cm/s LV PW:         1.80 cm  LV E/e' medial:  12.4 LV IVS:        1.60 cm  LV e' lateral:   7.18 cm/s LVOT diam:     2.20 cm  LV E/e' lateral: 11.5 LV SV:         83 LV SV Index:   37 LVOT Area:     3.80 cm  RIGHT VENTRICLE             IVC RV Basal diam:  4.00 cm     IVC diam: 2.70 cm RV Mid diam:    4.40 cm RV S prime:     12.60 cm/s TAPSE (M-mode): 2.1 cm LEFT ATRIUM             Index       RIGHT ATRIUM           Index LA diam:        4.30 cm 1.90 cm/m  RA Area:     25.50 cm LA Vol (A2C):   65.6 ml 28.97 ml/m RA Volume:   91.60 ml  40.45 ml/m LA Vol (A4C):   63.3 ml 27.95 ml/m LA Biplane Vol: 66.7 ml 29.45 ml/m  AORTIC VALVE AV Area (Vmax):    4.71 cm AV Area (Vmean):   4.27 cm AV Area (VTI):     4.48 cm AV Vmax:           121.00 cm/s AV Vmean:          82.700 cm/s AV VTI:            0.185 m AV Peak Grad:      5.9 mmHg AV Mean Grad:      3.0 mmHg LVOT Vmax:         150.00 cm/s LVOT Vmean:        93.000 cm/s LVOT VTI:          0.218 m LVOT/AV VTI ratio: 1.18  AORTA Ao Root diam: 3.30 cm MITRAL VALVE MV Area (PHT): 3.83 cm     SHUNTS MV Area VTI:  2.67 cm      Systemic VTI:  0.22 m MV Peak grad:  4.8 mmHg     Systemic Diam: 2.20 cm MV Mean grad:  3.0 mmHg MV Vmax:       1.09 m/s MV Vmean:      84.7 cm/s MV Decel Time: 198 msec MV E velocity: 82.50 cm/s MV A velocity: 108.00 cm/s MV E/A ratio:  0.76 Mihai Croitoru MD Electronically signed by Sanda Klein MD Signature Date/Time: 11/14/2020/9:53:49 AM    Final         Scheduled Meds:  albuterol  5 mg/hr Nebulization Once   aspirin EC  81 mg Oral Daily   atorvastatin  40 mg Oral Daily   doxycycline  100 mg Oral Q12H   fluticasone  1 spray Each Nare Daily   folic acid  1 mg Oral Daily   furosemide  60 mg Intravenous BID   insulin aspart  0-15 Units Subcutaneous Q4H   ipratropium-albuterol  3 mL Nebulization TID   loratadine  10 mg Oral Daily   mometasone-formoterol  2 puff Inhalation BID   predniSONE  40 mg Oral Q breakfast   sodium chloride flush  3 mL Intravenous Q12H   [START ON 11/16/2020] spironolactone  25 mg Oral Daily   thiamine  100 mg Oral Daily   Continuous Infusions:  sodium chloride     diltiazem (CARDIZEM) infusion 10 mg/hr (11/15/20 0731)   heparin 1,700 Units/hr (11/15/20 1352)     LOS: 1 day   Time spent: 80mins Greater than 50% of this time was spent in counseling, explanation of diagnosis, planning of further management, and coordination of care.   Voice Recognition Viviann Spare dictation system was used to create this note, attempts have been made to correct errors. Please contact the author with questions and/or clarifications.   Florencia Reasons, MD PhD FACP [facility]  Available via Epic secure chat 7am-7pm for nonurgent issues Please page for urgent issues To page the attending provider between 7A-7P or the covering provider during after hours 7P-7A, please log into the web site [email or web address] and access using universal Outlook password for that web site. If you do not have the password, please call the hospital operator.    11/15/2020, 4:30 PM

## 2020-11-15 NOTE — Progress Notes (Signed)
Received call from lab that pt's trop increased from 13 to 59. Pt stated he has no chest pain or any c/o at this time. Resting in bed with no distress noted. Notified MD per [facility] page.

## 2020-11-15 NOTE — H&P (View-Only) (Signed)
Cardiology Consultation:   Patient ID: Scott Jensen MRN: [id number]; DOB: Nov 14, 1955  Admit date: 11/13/2020 Date of Consult: 11/15/2020  PCP:  No primary care provider on file.   [facility] Providers Cardiologist:  New to [facility] (Scott Jensen). Previously seen by Scott Jensen in 2018.  Patient Profile:   Scott Jensen is a 65 y.o. male with a history of hypertension, hyperlipidiemia, type 2 diabetes, COPD, obstructive sleep apnea not on CPAP, cervical adenopathy, and tobacco abuse who is being seen for the evaluation of elevated troponin at the request of Scott Jensen.  History of Present Illness:   Scott Jensen is a 65 year old male with the above history. Patient previously seen by Scott Jensen during an admission in 11/2016 for tachypalpitations with rates as high as 160 bpm with associated chest discomfort. Cardiac enzymes remained negative and EKG was unremarkable. Echo showed LVEF of 55-60% with normal wall motion. Patient was started on Cardizem and plan was for outpatient event monitor and stress test. He was also treated with for COPD exacerbation. Patient did follow-up with Scott Jensen and had outpatient stress test and monitor. He thinks these were normal. I am unable to see these reports. He has not seen a Cardiologist since Scott Jensen retired. He was admitted to [facility] for 2 weeks in 06/2018 with COVID. He has had chronic dyspnea since that time. Of note, patient states he has never been diagnosed with CHF but has had a radiologist tell him that his heart is enlarged.  Patient presented to the ED on 11/13/2020 with chest pain and shortness of breath. Patient reports he was in his usual state of health until morning of 11/13/2020. He woke up and was fixing his coffee when he had sudden onset of chest pain across his whole chest that also radiated some to his neck. He checked his pulse oximeter and O2 sat was in the 70s at the time. He did not think to check his heart rate. Pain worsened to 9+/10  on the pain scale and he had associated shortness of breath and diaphoresis. Therefore he drove himself to the ED. Pain lasted a total of about 20-30 minutes and resolved after he was given Nitro and Fentanyl. He had another similar episode of pain while waiting in the ED that again lasted for about 20-30 minutes after being given more medications. Patient states pain is similar to his symptoms back in 2018. He has not had chest pain since that time. He has chronic shortness of breath since having COVID in 06/2018. He states this chronic dyspnea occurs with exertion and at rest. O2 sats often in the high 80s at home but he does not have home O2. He has chronic stable orthopnea but no PND. He has chronic lower extremity edema that waxes and wanes. No palpitations, lightheadedness/dizziness, syncope. No recent fevers or illness. He has chronic nasal congestion and productive cough from allergies - both stable. 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  In the ED, patient markedly hypertensive with BP as high as 205/131 and tachypneic. O2 82% on room air on arrival and patient was started on supplemental O2.EKG showed normal sinus rhythm, rate 96 bpm, with PAC and J point elevation with upsloping ST segment in V3-V4 which looks consistent with early repolarization. No significant changes from 2018. High-sensitivity troponin 18 >> 15 >> 15. BNP minimally elevated at 136.4.  Chest x-ray showed no acute findings. 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 Also showed nodular hepatic contours suspicious for cirrhosis. WBC 10.3, Hgb 19.5, Plts 178. Na 134, K 4.6, Glucose 162, BUN 11, Cr 0.70. Venous blood gas showed pH of 7.296, pCO2 64.4, pO2 59.3, BiCarb 30.4. He was admitted with acute combined respiratory failure felt to be secondary to COPD exacerbation.  Echo showed LVEF of 65-70% with normal wall motion, moderate LVH, and grade 1 diastolic dysfunction. RV mildly enlarged with mildly reduced systolic function. Interventricular septum flatten in systole consistent with RV pressure overload.   Patient had another episode of chest pain last night. EKG showed atrial flutter, heart rate 148 bpm, with 2:1 AV block. Repeat high-sensitivity troponin minimally elevated at 45 >> 65. No palpitations at this time. Patient was started on IV Cardizem and IV Heparin. Cardiology consulted for further evaluation. At the time of this evaluation, chest pain free.  He does have a history of tobacco use and states he smokes when he is stressed (like when he talks with his son). He states he has smoked 5 packs of cigarettes since 04/2020. He reports occasional alcohol use but states this is rare. He does not drink on a weekly bases and reports only drinking 2-3 beers at a time. He states he cannot afford to drink a lot of alcohol anymore.  He does have a family history of cardiovascular disease with his father and older brother having a history of stroke. No known family history of cardiac disease though.  Past Medical History:  Diagnosis Date   Cervical adenopathy    Steel plate C5-C6   Environmental allergies    History of chicken pox    Hyperglycemia    Borderline Daibetes   Hypertension    Increased pressure in the eye    Measles    Mumps    Seasonal allergies     Past Surgical History:  Procedure Laterality Date   Cervical Hardware Placement      CHOLECYSTECTOMY     COLONOSCOPY     Q5 yrs   TONSILLECTOMY     WISDOM TOOTH EXTRACTION       Home Medications:  Prior to Admission medications   Medication Sig Start Date End Date Taking? Authorizing Provider  albuterol (VENTOLIN HFA) 108 (90 Base) MCG/ACT inhaler Inhale 1-2 puffs into the lungs every 6 (six) hours as needed for wheezing or shortness of breath. 12/24/19   Brunetta Jeans, PA-C  Coenzyme  Q10 (COQ10) 100 MG CAPS Take 1 capsule by mouth daily. Take along with cholesterol medication to help prevent muscle aches 07/04/17   Brunetta Jeans, PA-C  Fluticasone-Salmeterol (ADVAIR DISKUS) 250-50 MCG/DOSE AEPB Inhale 1 puff into the lungs 2 (two) times daily. 12/24/19   Brunetta Jeans, PA-C  telmisartan (MICARDIS) 80 MG tablet Take 1 tablet (80 mg total) by mouth daily. 10/10/20   Midge Minium, MD    Inpatient Medications: Scheduled Meds:  albuterol  5 mg/hr Nebulization Once   doxycycline  100 mg Oral Q12H   fluticasone  1 spray Each Nare Daily   folic acid  1 mg Oral Daily   furosemide  40 mg Intravenous Daily   insulin aspart  0-15 Units Subcutaneous Q4H   ipratropium-albuterol  3 mL Nebulization TID   loratadine  10 mg Oral Daily   mometasone-formoterol  2 puff Inhalation BID   predniSONE  40 mg Oral Q breakfast  sodium chloride flush  3 mL Intravenous Q12H   spironolactone  12.5 mg Oral Daily   thiamine  100 mg Oral Daily   Continuous Infusions:  sodium chloride     diltiazem (CARDIZEM) infusion 10 mg/hr (11/15/20 0731)   heparin 1,400 Units/hr (11/15/20 0127)   PRN Meds: sodium chloride, albuterol, fentaNYL (SUBLIMAZE) injection, nitroGLYCERIN, sodium chloride flush, traMADol  Allergies:   No Known Allergies  Social History:   Social History   Socioeconomic History   Marital status: Divorced    Spouse name: Not on file   Number of children: Not on file   Years of education: Not on file   Highest education level: Not on file  Occupational History   Not on file  Tobacco Use   Smoking status: Light Smoker    Years: 3.00    Types: Cigarettes   Smokeless tobacco: Never  Vaping Use   Vaping Use: Never used  Substance and Sexual Activity   Alcohol use: Yes    Alcohol/week: 5.0 standard drinks    Types: 5 Standard drinks or equivalent per week    Comment: beer only   Drug use: No   Sexual activity: Yes    Partners: Female  Other Topics Concern    Not on file  Social History Narrative   Not on file   Social Determinants of Health   Financial Resource Strain: Not on file  Food Insecurity: Not on file  Transportation Needs: Not on file  Physical Activity: Not on file  Stress: Not on file  Social Connections: Not on file  Intimate Partner Violence: Not on file    Family History:    Family History  Problem Relation Age of Onset   Brain cancer Mother 69       Deceased   Liver cancer Mother        Mets   Anxiety disorder Mother    Colon cancer Father 32       Deceased   Alcoholism Father    Cancer Other        Aunts & Uncles   Leukemia Sister    Leukemia Cousin    Hypertension Brother    Mental illness Brother      ROS:  Please see the history of present illness.  Review of Systems  Constitutional:  Positive for diaphoresis. Negative for chills and fever.  HENT:  Positive for congestion (chronic).   Respiratory:  Positive for cough (chronic), sputum production (clear) and shortness of breath. Negative for hemoptysis.   Cardiovascular:  Positive for chest pain, orthopnea (chronic/stable) and leg swelling. Negative for PND.  Gastrointestinal:  Positive for nausea (after given steroids). Negative for blood in stool, melena and vomiting.  Genitourinary:  Negative for hematuria.  Musculoskeletal:  Negative for myalgias.  Neurological:  Positive for dizziness. Negative for loss of consciousness.  Endo/Heme/Allergies:  Does not bruise/bleed easily.  Psychiatric/Behavioral:  Positive for substance abuse.     Physical Exam/Data:   Vitals:   11/15/20 0514 11/15/20 0515 11/15/20 0736 11/15/20 0802  BP:  110/83  (!) 113/57  Pulse:  76  77  Resp:  18    Temp:  97.7 F (36.5 C)  (!) 97.4 F (36.3 C)  TempSrc:  Oral  Oral  SpO2:  96% 93% 91%  Weight: 113.1 kg     Height:        Intake/Output Summary (Last 24 hours) at 11/15/2020 1207 Last data filed at 11/15/2020 1130 Gross per 24 hour  Intake 653.2 ml  Output  1650 ml  Net -996.8 ml   Last 3 Weights 11/15/2020 11/14/2020 11/13/2020  Weight (lbs) 249 lb 4.8 oz 254 lb 10.1 oz 254 lb 1.6 oz  Weight (kg) 113.082 kg 115.5 kg 115.259 kg     Body mass index is 37.91 kg/m.  General: 65 y.o. obese Caucasian male resting comfortably in no acute distress. HEENT: Normocephalic and atraumatic. Sclera clear.  Neck: Supple. No JVD. Heart: Irregular rhythm with normal rate. No murmurs, gallops, or rubs. Radial pulses 2+ and equal bilaterally. Lungs: No increased work of breathing. Rhonchi bilaterally and possible very faint crackles in bases. Abdomen: Soft, non-distended, and non-tender to palpation.  Extremities: No lower extremity edema.    Skin: Warm and dry. Neuro: Alert and oriented x3. No focal deficits. Psych: Normal affect. Responds appropriately.  EKG:  The EKG was personally reviewed and demonstrates:  - Initial EKG on 11/13/2020: Sinus rhythm, rate 96 bpm, with PAC and J point elevation with upsloping ST segment in V3-V4 which looks consistent with early repolarization. No significant changes from 2018. - EKG from 11/14/2020: atrial flutter, heart rate 148 bpm, with 2:1 AV block. Telemetry:  Telemetry was personally reviewed and demonstrates:  Atrial flutter with rates in the 70s to 80s.  Relevant CV Studies:  Echocardiogram 11/14/2020: Impresions:   Laboratory Data:  High Sensitivity Troponin:   Recent Labs  Lab 11/13/20 1105 11/13/20 1348 11/14/20 1959 11/14/20 2201 11/14/20 2329  TROPONINIHS 18* 15 15 45* 65*     Chemistry Recent Labs  Lab 11/13/20 1105 11/13/20 2313 11/14/20 0209  NA 134* 132* 133*  K 4.6 4.7 4.7  CL 95* 95* 94*  CO2 30 30 32  GLUCOSE 162* 230* 196*  BUN 11 7* 6*  CREATININE 0.70 0.69 0.59*  CALCIUM 9.0 9.1 9.3  GFRNONAA >60 >60 >60  ANIONGAP 9 7 7     Recent Labs  Lab 11/13/20 2313 11/14/20 0209  PROT 7.4 7.1  ALBUMIN 4.0 3.8  AST 19 16  ALT 22 20  ALKPHOS 63 64  BILITOT 1.3* 1.2    Hematology Recent Labs  Lab 11/13/20 1105 11/14/20 0209  WBC 10.3 12.8*  RBC 6.40* 6.07*  HGB 19.5* 18.4*  HCT 60.5* 57.5*  MCV 94.5 94.7  MCH 30.5 30.3  MCHC 32.2 32.0  RDW 15.0 14.6  PLT 178 152   BNP Recent Labs  Lab 11/13/20 1105  BNP 136.4*    DDimer No results for input(s): DDIMER in the last 168 hours.   Radiology/Studies:  DG Chest Portable 1 View  Result Date: 11/13/2020 CLINICAL DATA:  65 year old male woke with chest pain radiating to the neck. Hypoxia on room air. EXAM: PORTABLE CHEST 1 VIEW COMPARISON:  Chest radiograph 11/06/2018 and earlier. FINDINGS: Portable AP semi upright view at 1104 hours. Stable mild cardiomegaly and mediastinal contours. Visualized tracheal air column is within normal limits. Mildly lower lung volumes. Allowing for portable technique the lungs are clear. No pneumothorax. Chronic cervical ACDF. No acute osseous abnormality identified. IMPRESSION: No acute cardiopulmonary abnormality. Electronically Signed   By: Genevie Ann M.D.   On: 11/13/2020 11:43   ECHOCARDIOGRAM COMPLETE  Result Date: 11/14/2020    ECHOCARDIOGRAM REPORT   Patient Name:   YIDA Cizek Date of Exam: 11/14/2020 Medical Rec #:  [id number]      Height:       68.0 in Accession #:    [id number]     Weight:       254.6  lb Date of Birth:  08/07/1955       BSA:          2.265 m Patient Age:    59 years       BP:           131/72 mmHg Patient Gender: M              HR:           92 bpm. Exam Location:  Inpatient Procedure: 2D Echo, Cardiac Doppler, Color Doppler and Intracardiac            Opacification Agent Indications:    CHF - Acute Disastolic  History:        Patient has prior history of Echocardiogram examinations, most                 recent 01/18/2020. CAD, COPD and Pulmonary HTN,                 Arrythmias:Tachycardia, Signs/Symptoms:Shortness of Breath; Risk                 Factors:Hypertension, Sleep Apnea, Former Smoker and                 Dyslipidemia. Cardiomegaly.   Sonographer:    Clayton Lefort RDCS (AE) Referring Phys: [id number] ANASTASSIA DOUTOVA  Sonographer Comments: Technically challenging study due to limited acoustic windows, Technically difficult study due to poor echo windows, suboptimal parasternal window, suboptimal apical window and patient is morbidly obese. Image acquisition challenging due to patient body habitus and Image acquisition challenging due to COPD. IMPRESSIONS  1. Left ventricular ejection fraction, by estimation, is 65 to 70%. The left ventricle has normal function. The left ventricle has no regional wall motion abnormalities. There is moderate concentric left ventricular hypertrophy. Left ventricular diastolic parameters are consistent with Grade I diastolic dysfunction (impaired relaxation). There is the interventricular septum is flattened in systole, consistent with right ventricular pressure overload.  2. Right ventricular systolic function is mildly reduced. The right ventricular size is mildly enlarged. Mildly increased right ventricular wall thickness.  3. Left atrial size was mild to moderately dilated.  4. Right atrial size was moderately dilated.  5. The mitral valve is normal in structure. No evidence of mitral valve regurgitation.  6. The aortic valve is tricuspid. There is mild calcification of the aortic valve. Aortic valve regurgitation is not visualized. Mild aortic valve sclerosis is present, with no evidence of aortic valve stenosis.  7. The inferior vena cava is dilated in size with <50% respiratory variability, suggesting right atrial pressure of 15 mmHg. Comparison(s): No significant change from prior study. Prior images reviewed side by side. Although the PA pressure could not be estimated due to the absence of tricuspid insufficincy, 2D findings suggest chronic severe pulmonary artery hypertension. FINDINGS  Left Ventricle: Left ventricular ejection fraction, by estimation, is 65 to 70%. The left ventricle has normal function. The  left ventricle has no regional wall motion abnormalities. Definity contrast agent was given IV to delineate the left ventricular  endocardial borders. The left ventricular internal cavity size was normal in size. There is moderate concentric left ventricular hypertrophy. The interventricular septum is flattened in systole, consistent with right ventricular pressure overload. Left ventricular diastolic parameters are consistent with Grade I diastolic dysfunction (impaired relaxation). Indeterminate filling pressures. Right Ventricle: The right ventricular size is mildly enlarged. Mildly increased right ventricular wall thickness. Right ventricular systolic function is mildly reduced. Left Atrium: Left atrial size  was mild to moderately dilated. Right Atrium: Right atrial size was moderately dilated. Pericardium: There is no evidence of pericardial effusion. Mitral Valve: The mitral valve is normal in structure. No evidence of mitral valve regurgitation. MV peak gradient, 4.8 mmHg. The mean mitral valve gradient is 3.0 mmHg. Tricuspid Valve: The tricuspid valve is normal in structure. Tricuspid valve regurgitation is not demonstrated. Aortic Valve: The aortic valve is tricuspid. There is mild calcification of the aortic valve. Aortic valve regurgitation is not visualized. Mild aortic valve sclerosis is present, with no evidence of aortic valve stenosis. Aortic valve mean gradient measures 3.0 mmHg. Aortic valve peak gradient measures 5.9 mmHg. Aortic valve area, by VTI measures 4.48 cm. Pulmonic Valve: The pulmonic valve was grossly normal. Pulmonic valve regurgitation is not visualized. No evidence of pulmonic stenosis. Aorta: The aortic root is normal in size and structure. Venous: The inferior vena cava is dilated in size with less than 50% respiratory variability, suggesting right atrial pressure of 15 mmHg. IAS/Shunts: No atrial level shunt detected by color flow Doppler.  LEFT VENTRICLE PLAX 2D LVIDd:          4.90 cm  Diastology LVIDs:         3.00 cm  LV e' medial:    6.64 cm/s LV PW:         1.80 cm  LV E/e' medial:  12.4 LV IVS:        1.60 cm  LV e' lateral:   7.18 cm/s LVOT diam:     2.20 cm  LV E/e' lateral: 11.5 LV SV:         83 LV SV Index:   37 LVOT Area:     3.80 cm  RIGHT VENTRICLE             IVC RV Basal diam:  4.00 cm     IVC diam: 2.70 cm RV Mid diam:    4.40 cm RV S prime:     12.60 cm/s TAPSE (M-mode): 2.1 cm LEFT ATRIUM             Index       RIGHT ATRIUM           Index LA diam:        4.30 cm 1.90 cm/m  RA Area:     25.50 cm LA Vol (A2C):   65.6 ml 28.97 ml/m RA Volume:   91.60 ml  40.45 ml/m LA Vol (A4C):   63.3 ml 27.95 ml/m LA Biplane Vol: 66.7 ml 29.45 ml/m  AORTIC VALVE AV Area (Vmax):    4.71 cm AV Area (Vmean):   4.27 cm AV Area (VTI):     4.48 cm AV Vmax:           121.00 cm/s AV Vmean:          82.700 cm/s AV VTI:            0.185 m AV Peak Grad:      5.9 mmHg AV Mean Grad:      3.0 mmHg LVOT Vmax:         150.00 cm/s LVOT Vmean:        93.000 cm/s LVOT VTI:          0.218 m LVOT/AV VTI ratio: 1.18  AORTA Ao Root diam: 3.30 cm MITRAL VALVE MV Area (PHT): 3.83 cm     SHUNTS MV Area VTI:   2.67 cm     Systemic VTI:  0.22 m  MV Peak grad:  4.8 mmHg     Systemic Diam: 2.20 cm MV Mean grad:  3.0 mmHg MV Vmax:       1.09 m/s MV Vmean:      84.7 cm/s MV Decel Time: 198 msec MV E velocity: 82.50 cm/s MV A velocity: 108.00 cm/s MV E/A ratio:  0.76 Mihai Croitoru MD Electronically signed by Sanda Klein MD Signature Date/Time: 11/14/2020/9:53:49 AM    Final    CT Angio Chest/Abd/Pel for Dissection W and/or Wo Contrast  Result Date: 11/13/2020 CLINICAL DATA:  Chest pain radiating to the neck. Suspect aortic dissection. EXAM: CT ANGIOGRAPHY CHEST, ABDOMEN AND PELVIS TECHNIQUE: Non-contrast CT of the chest was initially obtained. Multidetector CT imaging through the chest, abdomen and pelvis was performed using the standard protocol during bolus administration of intravenous contrast.  Multiplanar reconstructed images and MIPs were obtained and reviewed to evaluate the vascular anatomy. CONTRAST:  115mL OMNIPAQUE IOHEXOL 350 MG/ML SOLN COMPARISON:  CT angiography chest 07/23/2018 FINDINGS: CTA CHEST FINDINGS Cardiovascular: Heart size is within normal limits. Extensive coronary calcifications. No aortic intramural hematoma identified on noncontrast images. Thoracic aorta is normal in caliber. No significant stenosis. Mediastinum/Nodes: Nonenlarged lymph nodes seen throughout the mediastinum. No enlarged axillary or hilar lymph nodes. Lungs/Pleura: Lungs are clear. No pleural effusion or pneumothorax. Musculoskeletal: No chest wall abnormality. No acute or significant osseous findings. Review of the MIP images confirms the above findings. CTA ABDOMEN AND PELVIS FINDINGS VASCULAR Aorta: Scattered atheromatous plaque without aneurysmal dilatation or significant stenosis. Celiac: Patent without evidence of aneurysm, dissection, vasculitis or significant stenosis. SMA: Patent without evidence of aneurysm, dissection, vasculitis or significant stenosis. Renals: Both renal arteries are patent without evidence of aneurysm, dissection, vasculitis, fibromuscular dysplasia or significant stenosis. IMA: Patent. Inflow: Scattered calcified atheromatous plaque of the common, internal common external iliac arteries without high-grade stenosis. 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 The right profunda femoris artery originates above the level of the hip. No significant abnormality of the visualized left outflow arteries. Veins: No obvious venous abnormality within the limitations of this arterial phase study. Review of the MIP images confirms the above findings. NON-VASCULAR Hepatobiliary: Postsurgical changes of cholecystectomy are seen. Hepatic contours appear nodular, suspicious for cirrhosis. No significant biliary dilatation.  Pancreas: Unremarkable. No pancreatic ductal dilatation or surrounding inflammatory changes. Spleen: Normal in size without focal abnormality. Adrenals/Urinary Tract: Adrenal glands are normal. 1.9 cm simple cyst present in the lower pole of the right kidney. Kidneys, ureters, and bladder otherwise unremarkable. Stomach/Bowel: No bowel dilatation to indicate ileus or obstruction. Appendix is normal. Extensive diverticulosis of the descending and sigmoid colon. Lymphatic: No enlarged abdominal or pelvic lymph nodes. Multiple nonenlarged lymph nodes are seen in the retroperitoneum. Insert most likely reactive. Reproductive: Mildly enlarged prostate. Other: No abdominal wall hernia or abnormality. No abdominopelvic ascites. Musculoskeletal: No acute or significant osseous findings. Review of the MIP images confirms the above findings. IMPRESSION: 1. No acute abnormality of the chest, abdomen, or pelvis. No aortic dissection. 2. 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 3. Nodular hepatic contours are suspicious for cirrhosis. 4. Distal colonic diverticulosis without evidence of acute diverticulitis. Electronically Signed   By: Miachel Roux M.D.   On: 11/13/2020 13:50     Assessment and Plan:   Chest Pain Mildly Elevated Troponin - Patient presented with sudden onset of chest pain with associated shortness of breath and diaphoresis.  -  EKG showed no acute ischemic changes.  - Initial high-sensitivity troponin 18 >> 15 >> 15. However, then 45 >> 65 yesterday after another episode of chest pain (in rapid atrial flutter at this time).  - Echo showed LVEF of 65-70% with normal wall motion. - Patient currently chest pain free.  - Continue IV Heparin.  - Possible demand ischemic in setting of hypertensive urgency on admission and then atrial flutter with RVR. However, patient has multiple CV risk factors including HTN, HLD, DM, and tobacco use. CTA on admission showed  extensive coronary calcifications. Discussed with MD - will plan for left cardiac catheterization tomorrow. Will start aspirin and high-intensity statin. No beta-blocker for now given COPD exacerbation.  New Onset Atrial Flutter/Fibrillation with RVR - Initially in sinus rhythm and then went into atrial flutter with rates in the 140s on evening of 7/19. - Currently rates in the 70s to 80s. Looks more like atrial fibrillation now. - LV function normal on Echo. - Continue IV Cardizem.  - CHA2DS2-VASc = 5 (coronary calcifications,CHF,  HTN, DM, age x1). Currently on IV Heparin.  - Will plan for TEE/DCCV after cardiac catheterization (hopefully on Friday). Can hopefully switch to Eliquis tomorrow evening after cardiac catheterization.  Possible Mild Acute on Chronic Diastolic CHF - Patient states he has never been formally diagnosed with CHF but has been told he has an enlarged heart which can be an early sign of CHF. - BNP minimally elevated in the 130s. - No overt edema on chest x-ray. - LVEF of 65-70% with normal wall motion, moderate LVH, and grade 1 diastolic dysfunction. RV mildly enlarged with mildly reduced systolic function. Interventricular septum flatten in systole consistent with RV pressure overload.  - Currently on IV Lasix 40mg  daily. Net negative 1 L since admission. - Appears euvolemic on exam.  - Can likely switch to PO Lasix. - Monitor daily weights, strict I/O's, and renal function.  Hypertensive Urgency - BP initially 205/131 upon arrival to the ED. Patient states systolic BP usually in the 140s to 160s at home and "is not in the 200s often." Much improved. - On Telmisartan 80mg  daily at home. Held on admission. - Continue IV Cardizem for now.  - Started on Spironolactone 12.5mg  daily. OK to continue.  Hyperlipidemia - Lipid panel this admission: Total Cholesterol 142, Triglycerides 80, HDL 53, LDL 82.  - LDL goal <70 given coronary calcifications on CTA. - Will start  Lipitor 40mg  daily. - Will need repeat lipid panel and LFTs in 6-8 weeks.   Type 2 Diabetes Mellitus - Hemoglobin A1c 7.4. - Management per primary team.  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 - Will discuss with MD about whether any additional monitoring is needed.  COPD Exacerbation - Management per primary team.  Otherwise, per primary team.   Risk Assessment/Risk Scores:   TIMI Risk Score for Unstable Angina or Non-ST Elevation MI:   The patient's TIMI risk score is 5, which indicates a 26% risk of all cause mortality, new or recurrent myocardial infarction or need for urgent revascularization in the next 14 days.{   New York Heart Association (NYHA) Functional Class NYHA Class III  CHA2DS2-VASc Score = 5  his indicates a 7.2% annual risk of stroke. The patient's score is based upon: CHF History: Yes HTN History: Yes Diabetes History: Yes Stroke History: No Vascular Disease History: Yes Age Score: 1 Gender Score: 0    For questions or updates,  please contact [place] Please consult [email or web address] for contact info under    Signed, Darreld Mclean, PA-C  11/15/2020 12:07 PM

## 2020-11-15 NOTE — Progress Notes (Signed)
ANTICOAGULATION CONSULT NOTE - Initial Consult  Pharmacy Consult for Heparin Indication: atrial fibrillation  No Known Allergies  Patient Measurements: Height: 5\' 8"  (172.7 cm) Weight: 115.5 kg (254 lb 10.1 oz) IBW/kg (Calculated) : 68.4 Heparin Dosing Weight: 94 kg  Vital Signs: Temp: 97.8 F (36.6 C) (07/19 1941) Temp Source: Oral (07/19 1941) BP: 174/106 (07/19 1941) Pulse Rate: 150 (07/19 1941)  Labs: Recent Labs    11/13/20 1105 11/13/20 1348 11/13/20 2313 11/14/20 0209 11/14/20 1959 11/14/20 2201  HGB 19.5*  --   --  18.4*  --   --   HCT 60.5*  --   --  57.5*  --   --   PLT 178  --   --  152  --   --   LABPROT  --   --  14.9  --   --   --   INR  --   --  1.2  --   --   --   CREATININE 0.70  --  0.69 0.59*  --   --   CKTOTAL  --   --  42*  --   --   --   TROPONINIHS 18* 15  --   --  15 45*    Estimated Creatinine Clearance: 112 mL/min (A) (by C-G formula based on SCr of 0.59 mg/dL (L)).   Medical History: Past Medical History:  Diagnosis Date   Cervical adenopathy    Steel plate C5-C6   Environmental allergies    History of chicken pox    Hyperglycemia    Borderline Daibetes   Hypertension    Increased pressure in the eye    Measles    Mumps    Seasonal allergies     Medications:  Medications Prior to Admission  Medication Sig Dispense Refill Last Dose   albuterol (VENTOLIN HFA) 108 (90 Base) MCG/ACT inhaler Inhale 1-2 puffs into the lungs every 6 (six) hours as needed for wheezing or shortness of breath. 18 each 6    Coenzyme Q10 (COQ10) 100 MG CAPS Take 1 capsule by mouth daily. Take along with cholesterol medication to help prevent muscle aches 30 each 0    Fluticasone-Salmeterol (ADVAIR DISKUS) 250-50 MCG/DOSE AEPB Inhale 1 puff into the lungs 2 (two) times daily. 1 each 3    telmisartan (MICARDIS) 80 MG tablet Take 1 tablet (80 mg total) by mouth daily. 90 tablet 0     Assessment: 65 y.o. M presents with CP. Found to be in [place].  Pharmacy consulted to start heparin. No AC PTA. CBC ok on admission.  Goal of Therapy:  Heparin level 0.3-0.7 units/ml Monitor platelets by anticoagulation protocol: Yes   Plan:  Heparin IV bolus 5000 units Heparin gtt at 1400 units/hr Will f/u heparin level in 6 hours Daily heparin level and CBC  Sherlon Handing, PharmD, BCPS Please see amion for complete clinical pharmacist phone list 11/15/2020,1:00 AM

## 2020-11-15 NOTE — Consult Note (Signed)
Cardiology Consultation:   Patient ID: Scott Jensen MRN: [id number]; DOB: 1956-01-26  Admit date: 11/13/2020 Date of Consult: 11/15/2020  PCP:  No primary care provider on file.   [facility] Providers Cardiologist:  New to [facility] (Dr. Martinique). Previously seen by Dr. Wynonia Lawman in 2018.  Patient Profile:   Scott Jensen is a 65 y.o. male with a history of hypertension, hyperlipidiemia, type 2 diabetes, COPD, obstructive sleep apnea not on CPAP, cervical adenopathy, and tobacco abuse who is being seen for the evaluation of elevated troponin at the request of Dr. Erlinda Hong.  History of Present Illness:   Scott Jensen is a 65 year old male with the above history. Patient previously seen by Dr. Wynonia Lawman during an admission in 11/2016 for tachypalpitations with rates as high as 160 bpm with associated chest discomfort. Cardiac enzymes remained negative and EKG was unremarkable. Echo showed LVEF of 55-60% with normal wall motion. Patient was started on Cardizem and plan was for outpatient event monitor and stress test. He was also treated with for COPD exacerbation. Patient did follow-up with Dr. Wynonia Lawman and had outpatient stress test and monitor. He thinks these were normal. I am unable to see these reports. He has not seen a Cardiologist since Dr. Wynonia Lawman retired. He was admitted to [facility] for 2 weeks in 06/2018 with COVID. He has had chronic dyspnea since that time. Of note, patient states he has never been diagnosed with CHF but has had a radiologist tell him that his heart is enlarged.  Patient presented to the ED on 11/13/2020 with chest pain and shortness of breath. Patient reports he was in his usual state of health until morning of 11/13/2020. He woke up and was fixing his coffee when he had sudden onset of chest pain across his whole chest that also radiated some to his neck. He checked his pulse oximeter and O2 sat was in the 70s at the time. He did not think to check his heart rate. Pain worsened to 9+/10  on the pain scale and he had associated shortness of breath and diaphoresis. Therefore he drove himself to the ED. Pain lasted a total of about 20-30 minutes and resolved after he was given Nitro and Fentanyl. He had another similar episode of pain while waiting in the ED that again lasted for about 20-30 minutes after being given more medications. Patient states pain is similar to his symptoms back in 2018. He has not had chest pain since that time. He has chronic shortness of breath since having COVID in 06/2018. He states this chronic dyspnea occurs with exertion and at rest. O2 sats often in the high 80s at home but he does not have home O2. He has chronic stable orthopnea but no PND. He has chronic lower extremity edema that waxes and wanes. No palpitations, lightheadedness/dizziness, syncope. No recent fevers or illness. He has chronic nasal congestion and productive cough from allergies - both stable. 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  In the ED, patient markedly hypertensive with BP as high as 205/131 and tachypneic. O2 82% on room air on arrival and patient was started on supplemental O2.EKG showed normal sinus rhythm, rate 96 bpm, with PAC and J point elevation with upsloping ST segment in V3-V4 which looks consistent with early repolarization. No significant changes from 2018. High-sensitivity troponin 18 >> 15 >> 15. BNP minimally elevated at 136.4.  Chest x-ray showed no acute findings. 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 Also showed nodular hepatic contours suspicious for cirrhosis. WBC 10.3, Hgb 19.5, Plts 178. Na 134, K 4.6, Glucose 162, BUN 11, Cr 0.70. Venous blood gas showed pH of 7.296, pCO2 64.4, pO2 59.3, BiCarb 30.4. He was admitted with acute combined respiratory failure felt to be secondary to COPD exacerbation.  Echo showed LVEF of 65-70% with normal wall motion, moderate LVH, and grade 1 diastolic dysfunction. RV mildly enlarged with mildly reduced systolic function. Interventricular septum flatten in systole consistent with RV pressure overload.   Patient had another episode of chest pain last night. EKG showed atrial flutter, heart rate 148 bpm, with 2:1 AV block. Repeat high-sensitivity troponin minimally elevated at 45 >> 65. No palpitations at this time. Patient was started on IV Cardizem and IV Heparin. Cardiology consulted for further evaluation. At the time of this evaluation, chest pain free.  He does have a history of tobacco use and states he smokes when he is stressed (like when he talks with his son). He states he has smoked 5 packs of cigarettes since 04/2020. He reports occasional alcohol use but states this is rare. He does not drink on a weekly bases and reports only drinking 2-3 beers at a time. He states he cannot afford to drink a lot of alcohol anymore.  He does have a family history of cardiovascular disease with his father and older brother having a history of stroke. No known family history of cardiac disease though.  Past Medical History:  Diagnosis Date   Cervical adenopathy    Steel plate C5-C6   Environmental allergies    History of chicken pox    Hyperglycemia    Borderline Daibetes   Hypertension    Increased pressure in the eye    Measles    Mumps    Seasonal allergies     Past Surgical History:  Procedure Laterality Date   Cervical Hardware Placement      CHOLECYSTECTOMY     COLONOSCOPY     Q5 yrs   TONSILLECTOMY     WISDOM TOOTH EXTRACTION       Home Medications:  Prior to Admission medications   Medication Sig Start Date End Date Taking? Authorizing Provider  albuterol (VENTOLIN HFA) 108 (90 Base) MCG/ACT inhaler Inhale 1-2 puffs into the lungs every 6 (six) hours as needed for wheezing or shortness of breath. 12/24/19   Brunetta Jeans, PA-C  Coenzyme  Q10 (COQ10) 100 MG CAPS Take 1 capsule by mouth daily. Take along with cholesterol medication to help prevent muscle aches 07/04/17   Brunetta Jeans, PA-C  Fluticasone-Salmeterol (ADVAIR DISKUS) 250-50 MCG/DOSE AEPB Inhale 1 puff into the lungs 2 (two) times daily. 12/24/19   Brunetta Jeans, PA-C  telmisartan (MICARDIS) 80 MG tablet Take 1 tablet (80 mg total) by mouth daily. 10/10/20   Midge Minium, MD    Inpatient Medications: Scheduled Meds:  albuterol  5 mg/hr Nebulization Once   doxycycline  100 mg Oral Q12H   fluticasone  1 spray Each Nare Daily   folic acid  1 mg Oral Daily   furosemide  40 mg Intravenous Daily   insulin aspart  0-15 Units Subcutaneous Q4H   ipratropium-albuterol  3 mL Nebulization TID   loratadine  10 mg Oral Daily   mometasone-formoterol  2 puff Inhalation BID   predniSONE  40 mg Oral Q breakfast  sodium chloride flush  3 mL Intravenous Q12H   spironolactone  12.5 mg Oral Daily   thiamine  100 mg Oral Daily   Continuous Infusions:  sodium chloride     diltiazem (CARDIZEM) infusion 10 mg/hr (11/15/20 0731)   heparin 1,400 Units/hr (11/15/20 0127)   PRN Meds: sodium chloride, albuterol, fentaNYL (SUBLIMAZE) injection, nitroGLYCERIN, sodium chloride flush, traMADol  Allergies:   No Known Allergies  Social History:   Social History   Socioeconomic History   Marital status: Divorced    Spouse name: Not on file   Number of children: Not on file   Years of education: Not on file   Highest education level: Not on file  Occupational History   Not on file  Tobacco Use   Smoking status: Light Smoker    Years: 3.00    Types: Cigarettes   Smokeless tobacco: Never  Vaping Use   Vaping Use: Never used  Substance and Sexual Activity   Alcohol use: Yes    Alcohol/week: 5.0 standard drinks    Types: 5 Standard drinks or equivalent per week    Comment: beer only   Drug use: No   Sexual activity: Yes    Partners: Female  Other Topics Concern    Not on file  Social History Narrative   Not on file   Social Determinants of Health   Financial Resource Strain: Not on file  Food Insecurity: Not on file  Transportation Needs: Not on file  Physical Activity: Not on file  Stress: Not on file  Social Connections: Not on file  Intimate Partner Violence: Not on file    Family History:    Family History  Problem Relation Age of Onset   Brain cancer Mother 5       Deceased   Liver cancer Mother        Mets   Anxiety disorder Mother    Colon cancer Father 72       Deceased   Alcoholism Father    Cancer Other        Aunts & Uncles   Leukemia Sister    Leukemia Cousin    Hypertension Brother    Mental illness Brother      ROS:  Please see the history of present illness.  Review of Systems  Constitutional:  Positive for diaphoresis. Negative for chills and fever.  HENT:  Positive for congestion (chronic).   Respiratory:  Positive for cough (chronic), sputum production (clear) and shortness of breath. Negative for hemoptysis.   Cardiovascular:  Positive for chest pain, orthopnea (chronic/stable) and leg swelling. Negative for PND.  Gastrointestinal:  Positive for nausea (after given steroids). Negative for blood in stool, melena and vomiting.  Genitourinary:  Negative for hematuria.  Musculoskeletal:  Negative for myalgias.  Neurological:  Positive for dizziness. Negative for loss of consciousness.  Endo/Heme/Allergies:  Does not bruise/bleed easily.  Psychiatric/Behavioral:  Positive for substance abuse.     Physical Exam/Data:   Vitals:   11/15/20 0514 11/15/20 0515 11/15/20 0736 11/15/20 0802  BP:  110/83  (!) 113/57  Pulse:  76  77  Resp:  18    Temp:  97.7 F (36.5 C)  (!) 97.4 F (36.3 C)  TempSrc:  Oral  Oral  SpO2:  96% 93% 91%  Weight: 113.1 kg     Height:        Intake/Output Summary (Last 24 hours) at 11/15/2020 1207 Last data filed at 11/15/2020 1130 Gross per 24 hour  Intake 653.2 ml  Output  1650 ml  Net -996.8 ml   Last 3 Weights 11/15/2020 11/14/2020 11/13/2020  Weight (lbs) 249 lb 4.8 oz 254 lb 10.1 oz 254 lb 1.6 oz  Weight (kg) 113.082 kg 115.5 kg 115.259 kg     Body mass index is 37.91 kg/m.  General: 65 y.o. obese Caucasian male resting comfortably in no acute distress. HEENT: Normocephalic and atraumatic. Sclera clear.  Neck: Supple. No JVD. Heart: Irregular rhythm with normal rate. No murmurs, gallops, or rubs. Radial pulses 2+ and equal bilaterally. Lungs: No increased work of breathing. Rhonchi bilaterally and possible very faint crackles in bases. Abdomen: Soft, non-distended, and non-tender to palpation.  Extremities: No lower extremity edema.    Skin: Warm and dry. Neuro: Alert and oriented x3. No focal deficits. Psych: Normal affect. Responds appropriately.  EKG:  The EKG was personally reviewed and demonstrates:  - Initial EKG on 11/13/2020: Sinus rhythm, rate 96 bpm, with PAC and J point elevation with upsloping ST segment in V3-V4 which looks consistent with early repolarization. No significant changes from 2018. - EKG from 11/14/2020: atrial flutter, heart rate 148 bpm, with 2:1 AV block. Telemetry:  Telemetry was personally reviewed and demonstrates:  Atrial flutter with rates in the 70s to 80s.  Relevant CV Studies:  Echocardiogram 11/14/2020: Impresions:   Laboratory Data:  High Sensitivity Troponin:   Recent Labs  Lab 11/13/20 1105 11/13/20 1348 11/14/20 1959 11/14/20 2201 11/14/20 2329  TROPONINIHS 18* 15 15 45* 65*     Chemistry Recent Labs  Lab 11/13/20 1105 11/13/20 2313 11/14/20 0209  NA 134* 132* 133*  K 4.6 4.7 4.7  CL 95* 95* 94*  CO2 30 30 32  GLUCOSE 162* 230* 196*  BUN 11 7* 6*  CREATININE 0.70 0.69 0.59*  CALCIUM 9.0 9.1 9.3  GFRNONAA >60 >60 >60  ANIONGAP 9 7 7     Recent Labs  Lab 11/13/20 2313 11/14/20 0209  PROT 7.4 7.1  ALBUMIN 4.0 3.8  AST 19 16  ALT 22 20  ALKPHOS 63 64  BILITOT 1.3* 1.2    Hematology Recent Labs  Lab 11/13/20 1105 11/14/20 0209  WBC 10.3 12.8*  RBC 6.40* 6.07*  HGB 19.5* 18.4*  HCT 60.5* 57.5*  MCV 94.5 94.7  MCH 30.5 30.3  MCHC 32.2 32.0  RDW 15.0 14.6  PLT 178 152   BNP Recent Labs  Lab 11/13/20 1105  BNP 136.4*    DDimer No results for input(s): DDIMER in the last 168 hours.   Radiology/Studies:  DG Chest Portable 1 View  Result Date: 11/13/2020 CLINICAL DATA:  65 year old male woke with chest pain radiating to the neck. Hypoxia on room air. EXAM: PORTABLE CHEST 1 VIEW COMPARISON:  Chest radiograph 11/06/2018 and earlier. FINDINGS: Portable AP semi upright view at 1104 hours. Stable mild cardiomegaly and mediastinal contours. Visualized tracheal air column is within normal limits. Mildly lower lung volumes. Allowing for portable technique the lungs are clear. No pneumothorax. Chronic cervical ACDF. No acute osseous abnormality identified. IMPRESSION: No acute cardiopulmonary abnormality. Electronically Signed   By: Genevie Ann M.D.   On: 11/13/2020 11:43   ECHOCARDIOGRAM COMPLETE  Result Date: 11/14/2020    ECHOCARDIOGRAM REPORT   Patient Name:   Scott Jensen Date of Exam: 11/14/2020 Medical Rec #:  [id number]      Height:       68.0 in Accession #:    [id number]     Weight:       254.6  lb Date of Birth:  1955-05-26       BSA:          2.265 m Patient Age:    16 years       BP:           131/72 mmHg Patient Gender: M              HR:           92 bpm. Exam Location:  Inpatient Procedure: 2D Echo, Cardiac Doppler, Color Doppler and Intracardiac            Opacification Agent Indications:    CHF - Acute Disastolic  History:        Patient has prior history of Echocardiogram examinations, most                 recent 01/18/2020. CAD, COPD and Pulmonary HTN,                 Arrythmias:Tachycardia, Signs/Symptoms:Shortness of Breath; Risk                 Factors:Hypertension, Sleep Apnea, Former Smoker and                 Dyslipidemia. Cardiomegaly.   Sonographer:    Clayton Lefort RDCS (AE) Referring Phys: [id number] ANASTASSIA DOUTOVA  Sonographer Comments: Technically challenging study due to limited acoustic windows, Technically difficult study due to poor echo windows, suboptimal parasternal window, suboptimal apical window and patient is morbidly obese. Image acquisition challenging due to patient body habitus and Image acquisition challenging due to COPD. IMPRESSIONS  1. Left ventricular ejection fraction, by estimation, is 65 to 70%. The left ventricle has normal function. The left ventricle has no regional wall motion abnormalities. There is moderate concentric left ventricular hypertrophy. Left ventricular diastolic parameters are consistent with Grade I diastolic dysfunction (impaired relaxation). There is the interventricular septum is flattened in systole, consistent with right ventricular pressure overload.  2. Right ventricular systolic function is mildly reduced. The right ventricular size is mildly enlarged. Mildly increased right ventricular wall thickness.  3. Left atrial size was mild to moderately dilated.  4. Right atrial size was moderately dilated.  5. The mitral valve is normal in structure. No evidence of mitral valve regurgitation.  6. The aortic valve is tricuspid. There is mild calcification of the aortic valve. Aortic valve regurgitation is not visualized. Mild aortic valve sclerosis is present, with no evidence of aortic valve stenosis.  7. The inferior vena cava is dilated in size with <50% respiratory variability, suggesting right atrial pressure of 15 mmHg. Comparison(s): No significant change from prior study. Prior images reviewed side by side. Although the PA pressure could not be estimated due to the absence of tricuspid insufficincy, 2D findings suggest chronic severe pulmonary artery hypertension. FINDINGS  Left Ventricle: Left ventricular ejection fraction, by estimation, is 65 to 70%. The left ventricle has normal function. The  left ventricle has no regional wall motion abnormalities. Definity contrast agent was given IV to delineate the left ventricular  endocardial borders. The left ventricular internal cavity size was normal in size. There is moderate concentric left ventricular hypertrophy. The interventricular septum is flattened in systole, consistent with right ventricular pressure overload. Left ventricular diastolic parameters are consistent with Grade I diastolic dysfunction (impaired relaxation). Indeterminate filling pressures. Right Ventricle: The right ventricular size is mildly enlarged. Mildly increased right ventricular wall thickness. Right ventricular systolic function is mildly reduced. Left Atrium: Left atrial size  was mild to moderately dilated. Right Atrium: Right atrial size was moderately dilated. Pericardium: There is no evidence of pericardial effusion. Mitral Valve: The mitral valve is normal in structure. No evidence of mitral valve regurgitation. MV peak gradient, 4.8 mmHg. The mean mitral valve gradient is 3.0 mmHg. Tricuspid Valve: The tricuspid valve is normal in structure. Tricuspid valve regurgitation is not demonstrated. Aortic Valve: The aortic valve is tricuspid. There is mild calcification of the aortic valve. Aortic valve regurgitation is not visualized. Mild aortic valve sclerosis is present, with no evidence of aortic valve stenosis. Aortic valve mean gradient measures 3.0 mmHg. Aortic valve peak gradient measures 5.9 mmHg. Aortic valve area, by VTI measures 4.48 cm. Pulmonic Valve: The pulmonic valve was grossly normal. Pulmonic valve regurgitation is not visualized. No evidence of pulmonic stenosis. Aorta: The aortic root is normal in size and structure. Venous: The inferior vena cava is dilated in size with less than 50% respiratory variability, suggesting right atrial pressure of 15 mmHg. IAS/Shunts: No atrial level shunt detected by color flow Doppler.  LEFT VENTRICLE PLAX 2D LVIDd:          4.90 cm  Diastology LVIDs:         3.00 cm  LV e' medial:    6.64 cm/s LV PW:         1.80 cm  LV E/e' medial:  12.4 LV IVS:        1.60 cm  LV e' lateral:   7.18 cm/s LVOT diam:     2.20 cm  LV E/e' lateral: 11.5 LV SV:         83 LV SV Index:   37 LVOT Area:     3.80 cm  RIGHT VENTRICLE             IVC RV Basal diam:  4.00 cm     IVC diam: 2.70 cm RV Mid diam:    4.40 cm RV S prime:     12.60 cm/s TAPSE (M-mode): 2.1 cm LEFT ATRIUM             Index       RIGHT ATRIUM           Index LA diam:        4.30 cm 1.90 cm/m  RA Area:     25.50 cm LA Vol (A2C):   65.6 ml 28.97 ml/m RA Volume:   91.60 ml  40.45 ml/m LA Vol (A4C):   63.3 ml 27.95 ml/m LA Biplane Vol: 66.7 ml 29.45 ml/m  AORTIC VALVE AV Area (Vmax):    4.71 cm AV Area (Vmean):   4.27 cm AV Area (VTI):     4.48 cm AV Vmax:           121.00 cm/s AV Vmean:          82.700 cm/s AV VTI:            0.185 m AV Peak Grad:      5.9 mmHg AV Mean Grad:      3.0 mmHg LVOT Vmax:         150.00 cm/s LVOT Vmean:        93.000 cm/s LVOT VTI:          0.218 m LVOT/AV VTI ratio: 1.18  AORTA Ao Root diam: 3.30 cm MITRAL VALVE MV Area (PHT): 3.83 cm     SHUNTS MV Area VTI:   2.67 cm     Systemic VTI:  0.22 m  MV Peak grad:  4.8 mmHg     Systemic Diam: 2.20 cm MV Mean grad:  3.0 mmHg MV Vmax:       1.09 m/s MV Vmean:      84.7 cm/s MV Decel Time: 198 msec MV E velocity: 82.50 cm/s MV A velocity: 108.00 cm/s MV E/A ratio:  0.76 Mihai Croitoru MD Electronically signed by Sanda Klein MD Signature Date/Time: 11/14/2020/9:53:49 AM    Final    CT Angio Chest/Abd/Pel for Dissection W and/or Wo Contrast  Result Date: 11/13/2020 CLINICAL DATA:  Chest pain radiating to the neck. Suspect aortic dissection. EXAM: CT ANGIOGRAPHY CHEST, ABDOMEN AND PELVIS TECHNIQUE: Non-contrast CT of the chest was initially obtained. Multidetector CT imaging through the chest, abdomen and pelvis was performed using the standard protocol during bolus administration of intravenous contrast.  Multiplanar reconstructed images and MIPs were obtained and reviewed to evaluate the vascular anatomy. CONTRAST:  135mL OMNIPAQUE IOHEXOL 350 MG/ML SOLN COMPARISON:  CT angiography chest 07/23/2018 FINDINGS: CTA CHEST FINDINGS Cardiovascular: Heart size is within normal limits. Extensive coronary calcifications. No aortic intramural hematoma identified on noncontrast images. Thoracic aorta is normal in caliber. No significant stenosis. Mediastinum/Nodes: Nonenlarged lymph nodes seen throughout the mediastinum. No enlarged axillary or hilar lymph nodes. Lungs/Pleura: Lungs are clear. No pleural effusion or pneumothorax. Musculoskeletal: No chest wall abnormality. No acute or significant osseous findings. Review of the MIP images confirms the above findings. CTA ABDOMEN AND PELVIS FINDINGS VASCULAR Aorta: Scattered atheromatous plaque without aneurysmal dilatation or significant stenosis. Celiac: Patent without evidence of aneurysm, dissection, vasculitis or significant stenosis. SMA: Patent without evidence of aneurysm, dissection, vasculitis or significant stenosis. Renals: Both renal arteries are patent without evidence of aneurysm, dissection, vasculitis, fibromuscular dysplasia or significant stenosis. IMA: Patent. Inflow: Scattered calcified atheromatous plaque of the common, internal common external iliac arteries without high-grade stenosis. 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 The right profunda femoris artery originates above the level of the hip. No significant abnormality of the visualized left outflow arteries. Veins: No obvious venous abnormality within the limitations of this arterial phase study. Review of the MIP images confirms the above findings. NON-VASCULAR Hepatobiliary: Postsurgical changes of cholecystectomy are seen. Hepatic contours appear nodular, suspicious for cirrhosis. No significant biliary dilatation.  Pancreas: Unremarkable. No pancreatic ductal dilatation or surrounding inflammatory changes. Spleen: Normal in size without focal abnormality. Adrenals/Urinary Tract: Adrenal glands are normal. 1.9 cm simple cyst present in the lower pole of the right kidney. Kidneys, ureters, and bladder otherwise unremarkable. Stomach/Bowel: No bowel dilatation to indicate ileus or obstruction. Appendix is normal. Extensive diverticulosis of the descending and sigmoid colon. Lymphatic: No enlarged abdominal or pelvic lymph nodes. Multiple nonenlarged lymph nodes are seen in the retroperitoneum. Insert most likely reactive. Reproductive: Mildly enlarged prostate. Other: No abdominal wall hernia or abnormality. No abdominopelvic ascites. Musculoskeletal: No acute or significant osseous findings. Review of the MIP images confirms the above findings. IMPRESSION: 1. No acute abnormality of the chest, abdomen, or pelvis. No aortic dissection. 2. 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 3. Nodular hepatic contours are suspicious for cirrhosis. 4. Distal colonic diverticulosis without evidence of acute diverticulitis. Electronically Signed   By: Miachel Roux M.D.   On: 11/13/2020 13:50     Assessment and Plan:   Chest Pain Mildly Elevated Troponin - Patient presented with sudden onset of chest pain with associated shortness of breath and diaphoresis.  -  EKG showed no acute ischemic changes.  - Initial high-sensitivity troponin 18 >> 15 >> 15. However, then 45 >> 65 yesterday after another episode of chest pain (in rapid atrial flutter at this time).  - Echo showed LVEF of 65-70% with normal wall motion. - Patient currently chest pain free.  - Continue IV Heparin.  - Possible demand ischemic in setting of hypertensive urgency on admission and then atrial flutter with RVR. However, patient has multiple CV risk factors including HTN, HLD, DM, and tobacco use. CTA on admission showed  extensive coronary calcifications. Discussed with MD - will plan for left cardiac catheterization tomorrow. Will start aspirin and high-intensity statin. No beta-blocker for now given COPD exacerbation.  New Onset Atrial Flutter/Fibrillation with RVR - Initially in sinus rhythm and then went into atrial flutter with rates in the 140s on evening of 7/19. - Currently rates in the 70s to 80s. Looks more like atrial fibrillation now. - LV function normal on Echo. - Continue IV Cardizem.  - CHA2DS2-VASc = 5 (coronary calcifications,CHF,  HTN, DM, age x1). Currently on IV Heparin.  - Will plan for TEE/DCCV after cardiac catheterization (hopefully on Friday). Can hopefully switch to Eliquis tomorrow evening after cardiac catheterization.  Possible Mild Acute on Chronic Diastolic CHF - Patient states he has never been formally diagnosed with CHF but has been told he has an enlarged heart which can be an early sign of CHF. - BNP minimally elevated in the 130s. - No overt edema on chest x-ray. - LVEF of 65-70% with normal wall motion, moderate LVH, and grade 1 diastolic dysfunction. RV mildly enlarged with mildly reduced systolic function. Interventricular septum flatten in systole consistent with RV pressure overload.  - Currently on IV Lasix 40mg  daily. Net negative 1 L since admission. - Appears euvolemic on exam.  - Can likely switch to PO Lasix. - Monitor daily weights, strict I/O's, and renal function.  Hypertensive Urgency - BP initially 205/131 upon arrival to the ED. Patient states systolic BP usually in the 140s to 160s at home and "is not in the 200s often." Much improved. - On Telmisartan 80mg  daily at home. Held on admission. - Continue IV Cardizem for now.  - Started on Spironolactone 12.5mg  daily. OK to continue.  Hyperlipidemia - Lipid panel this admission: Total Cholesterol 142, Triglycerides 80, HDL 53, LDL 82.  - LDL goal <70 given coronary calcifications on CTA. - Will start  Lipitor 40mg  daily. - Will need repeat lipid panel and LFTs in 6-8 weeks.   Type 2 Diabetes Mellitus - Hemoglobin A1c 7.4. - Management per primary team.  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 - Will discuss with MD about whether any additional monitoring is needed.  COPD Exacerbation - Management per primary team.  Otherwise, per primary team.   Risk Assessment/Risk Scores:   TIMI Risk Score for Unstable Angina or Non-ST Elevation MI:   The patient's TIMI risk score is 5, which indicates a 26% risk of all cause mortality, new or recurrent myocardial infarction or need for urgent revascularization in the next 14 days.{   New York Heart Association (NYHA) Functional Class NYHA Class III  CHA2DS2-VASc Score = 5  his indicates a 7.2% annual risk of stroke. The patient's score is based upon: CHF History: Yes HTN History: Yes Diabetes History: Yes Stroke History: No Vascular Disease History: Yes Age Score: 1 Gender Score: 0    For questions or updates,  please contact [place] Please consult [email or web address] for contact info under    Signed, Darreld Mclean, PA-C  11/15/2020 12:07 PM

## 2020-11-15 NOTE — Progress Notes (Signed)
ANTICOAGULATION CONSULT NOTE  Pharmacy Consult for Heparin Indication: atrial fibrillation  No Known Allergies  Patient Measurements: Height: 5\' 8"  (172.7 cm) Weight: 113.1 kg (249 lb 4.8 oz) IBW/kg (Calculated) : 68.4 Heparin Dosing Weight: 94 kg  Vital Signs: Temp: 98.3 F (36.8 C) (07/20 1941) Temp Source: Oral (07/20 1941) BP: 103/62 (07/20 1941) Pulse Rate: 81 (07/20 1941)  Labs: Recent Labs    11/13/20 1105 11/13/20 1348 11/13/20 2313 11/14/20 0209 11/14/20 1959 11/14/20 2201 11/14/20 2329 11/15/20 0909 11/15/20 1920 11/15/20 2126  HGB 19.5*  --   --  18.4*  --   --   --   --  17.6*  --   HCT 60.5*  --   --  57.5*  --   --   --   --  53.9*  --   PLT 178  --   --  152  --   --   --   --   --   --   LABPROT  --   --  14.9  --   --   --   --   --   --   --   INR  --   --  1.2  --   --   --   --   --   --   --   HEPARINUNFRC  --   --   --   --   --   --   --  <0.10*  --  0.10*  CREATININE 0.70  --  0.69 0.59*  --   --   --   --   --   --   CKTOTAL  --   --  42*  --   --   --   --   --   --   --   TROPONINIHS 18*   < >  --   --  15 45* 65*  --   --   --    < > = values in this interval not displayed.     Estimated Creatinine Clearance: 110.9 mL/min (A) (by C-G formula based on SCr of 0.59 mg/dL (L)).   Medical History: Past Medical History:  Diagnosis Date   Cervical adenopathy    Steel plate C5-C6   Environmental allergies    History of chicken pox    Hyperglycemia    Borderline Daibetes   Hypertension    Increased pressure in the eye    Measles    Mumps    Seasonal allergies     Assessment: 65 y.o. M presents with CP. Found to be in [place]. Pharmacy consulted to dose heparin. No AC PTA.  Heparin level delayed several hours this evening due to issue with severely elevated hematocrit over 50. Level finally resulted at 0.1, below goal. No bleeding issues noted.    Goal of Therapy:  Heparin level 0.3-0.7 units/ml Monitor platelets by  anticoagulation protocol: Yes   Plan:  Heparin IV bolus 2000 units Increase heparin to 2100 units/hr Will f/u heparin level in 6 hours Daily heparin level and CBC  Erin Hearing PharmD., BCPS Clinical Pharmacist 11/15/2020 10:19 PM

## 2020-11-15 NOTE — Progress Notes (Signed)
ANTICOAGULATION CONSULT NOTE  Pharmacy Consult for Heparin Indication: atrial fibrillation  No Known Allergies  Patient Measurements: Height: 5\' 8"  (172.7 cm) Weight: 113.1 kg (249 lb 4.8 oz) IBW/kg (Calculated) : 68.4 Heparin Dosing Weight: 94 kg  Vital Signs: Temp: 97.4 F (36.3 C) (07/20 0802) Temp Source: Oral (07/20 0802) BP: 113/57 (07/20 0802) Pulse Rate: 77 (07/20 0802)  Labs: Recent Labs    11/13/20 1105 11/13/20 1348 11/13/20 2313 11/14/20 0209 11/14/20 1959 11/14/20 2201 11/14/20 2329 11/15/20 0909  HGB 19.5*  --   --  18.4*  --   --   --   --   HCT 60.5*  --   --  57.5*  --   --   --   --   PLT 178  --   --  152  --   --   --   --   LABPROT  --   --  14.9  --   --   --   --   --   INR  --   --  1.2  --   --   --   --   --   HEPARINUNFRC  --   --   --   --   --   --   --  <0.10*  CREATININE 0.70  --  0.69 0.59*  --   --   --   --   CKTOTAL  --   --  42*  --   --   --   --   --   TROPONINIHS 18*   < >  --   --  15 45* 65*  --    < > = values in this interval not displayed.     Estimated Creatinine Clearance: 110.9 mL/min (A) (by C-G formula based on SCr of 0.59 mg/dL (L)).   Medical History: Past Medical History:  Diagnosis Date   Cervical adenopathy    Steel plate C5-C6   Environmental allergies    History of chicken pox    Hyperglycemia    Borderline Daibetes   Hypertension    Increased pressure in the eye    Measles    Mumps    Seasonal allergies     Medications:  Medications Prior to Admission  Medication Sig Dispense Refill Last Dose   albuterol (VENTOLIN HFA) 108 (90 Base) MCG/ACT inhaler Inhale 1-2 puffs into the lungs every 6 (six) hours as needed for wheezing or shortness of breath. 18 each 6    Coenzyme Q10 (COQ10) 100 MG CAPS Take 1 capsule by mouth daily. Take along with cholesterol medication to help prevent muscle aches 30 each 0    Fluticasone-Salmeterol (ADVAIR DISKUS) 250-50 MCG/DOSE AEPB Inhale 1 puff into the lungs 2  (two) times daily. 1 each 3    telmisartan (MICARDIS) 80 MG tablet Take 1 tablet (80 mg total) by mouth daily. 90 tablet 0     Assessment: 65 y.o. M presents with CP. Found to be in [place]. Pharmacy consulted to dose heparin. No AC PTA. -initial heparin level undetectable   Goal of Therapy:  Heparin level 0.3-0.7 units/ml Monitor platelets by anticoagulation protocol: Yes   Plan:  Heparin IV bolus 2500 units Increase heparin to 1700 units/hr Will f/u heparin level in 6 hours Daily heparin level and CBC  Hildred Laser, PharmD Clinical Pharmacist **Pharmacist phone directory can now be found on [email or web address] ([email or web address]).  Listed under [place].

## 2020-11-16 ENCOUNTER — Encounter (HOSPITAL_COMMUNITY)
Admission: EM | Disposition: A | Discharge status: Home / Self Care | Payer: Self-pay | Source: Home / Self Care | Attending: Internal Medicine

## 2020-11-16 ENCOUNTER — Encounter (HOSPITAL_COMMUNITY): Payer: Self-pay | Admitting: Cardiovascular Disease

## 2020-11-16 DIAGNOSIS — I272 Pulmonary hypertension, unspecified: Secondary | ICD-10-CM

## 2020-11-16 DIAGNOSIS — I4892 Unspecified atrial flutter: Secondary | ICD-10-CM

## 2020-11-16 DIAGNOSIS — I2510 Atherosclerotic heart disease of native coronary artery without angina pectoris: Secondary | ICD-10-CM

## 2020-11-16 DIAGNOSIS — I5031 Acute diastolic (congestive) heart failure: Secondary | ICD-10-CM

## 2020-11-16 DIAGNOSIS — I483 Typical atrial flutter: Secondary | ICD-10-CM

## 2020-11-16 HISTORY — PX: RIGHT/LEFT HEART CATH AND CORONARY ANGIOGRAPHY: CATH118266

## 2020-11-16 LAB — POCT I-STAT EG7
Acid-Base Excess: 7 mmol/L — ABNORMAL HIGH (ref 0.0–2.0)
Acid-Base Excess: 7 mmol/L — ABNORMAL HIGH (ref 0.0–2.0)
Bicarbonate: 39.2 mmol/L — ABNORMAL HIGH (ref 20.0–28.0)
Bicarbonate: 39.3 mmol/L — ABNORMAL HIGH (ref 20.0–28.0)
Calcium, Ion: 1.24 mmol/L (ref 1.15–1.40)
Calcium, Ion: 1.24 mmol/L (ref 1.15–1.40)
HCT: 59 % — ABNORMAL HIGH (ref 39.0–52.0)
HCT: 59 % — ABNORMAL HIGH (ref 39.0–52.0)
Hemoglobin: 20.1 g/dL — ABNORMAL HIGH (ref 13.0–17.0)
Hemoglobin: 20.1 g/dL — ABNORMAL HIGH (ref 13.0–17.0)
O2 Saturation: 66 %
O2 Saturation: 67 %
Potassium: 4.4 mmol/L (ref 3.5–5.1)
Potassium: 4.5 mmol/L (ref 3.5–5.1)
Sodium: 137 mmol/L (ref 135–145)
Sodium: 137 mmol/L (ref 135–145)
TCO2: 42 mmol/L — ABNORMAL HIGH (ref 22–32)
TCO2: 42 mmol/L — ABNORMAL HIGH (ref 22–32)
pCO2, Ven: 87.1 mmHg (ref 44.0–60.0)
pCO2, Ven: 86.6 mmHg (ref 44.0–60.0)
pH, Ven: 7.261 (ref 7.250–7.430)
pH, Ven: 7.265 (ref 7.250–7.430)
pO2, Ven: 41 mmHg (ref 32.0–45.0)
pO2, Ven: 42 mmHg (ref 32.0–45.0)

## 2020-11-16 LAB — CBC
HCT: 56.9 % — ABNORMAL HIGH (ref 39.0–52.0)
Hemoglobin: 18.2 g/dL — ABNORMAL HIGH (ref 13.0–17.0)
MCH: 30.4 pg (ref 26.0–34.0)
MCHC: 32 g/dL (ref 30.0–36.0)
MCV: 95.2 fL (ref 80.0–100.0)
Platelets: 156 10*3/uL (ref 150–400)
RBC: 5.98 MIL/uL — ABNORMAL HIGH (ref 4.22–5.81)
RDW: 14.7 % (ref 11.5–15.5)
WBC: 10.8 10*3/uL — ABNORMAL HIGH (ref 4.0–10.5)
nRBC: 0 % (ref 0.0–0.2)

## 2020-11-16 LAB — GLUCOSE, CAPILLARY
Glucose-Capillary: 123 mg/dL — ABNORMAL HIGH (ref 70–99)
Glucose-Capillary: 126 mg/dL — ABNORMAL HIGH (ref 70–99)
Glucose-Capillary: 140 mg/dL — ABNORMAL HIGH (ref 70–99)
Glucose-Capillary: 166 mg/dL — ABNORMAL HIGH (ref 70–99)
Glucose-Capillary: 186 mg/dL — ABNORMAL HIGH (ref 70–99)
Glucose-Capillary: 85 mg/dL (ref 70–99)

## 2020-11-16 LAB — HEPARIN LEVEL (UNFRACTIONATED): Heparin Unfractionated: 0.2 IU/mL — ABNORMAL LOW (ref 0.30–0.70)

## 2020-11-16 LAB — POCT I-STAT 7, (LYTES, BLD GAS, ICA,H+H)
Acid-Base Excess: 8 mmol/L — ABNORMAL HIGH (ref 0.0–2.0)
Bicarbonate: 40 mmol/L — ABNORMAL HIGH (ref 20.0–28.0)
Calcium, Ion: 1.24 mmol/L (ref 1.15–1.40)
HCT: 59 % — ABNORMAL HIGH (ref 39.0–52.0)
Hemoglobin: 20.1 g/dL — ABNORMAL HIGH (ref 13.0–17.0)
O2 Saturation: 98 %
Potassium: 4.5 mmol/L (ref 3.5–5.1)
Sodium: 136 mmol/L (ref 135–145)
TCO2: 43 mmol/L — ABNORMAL HIGH (ref 22–32)
pCO2 arterial: 83.7 mmHg (ref 32.0–48.0)
pH, Arterial: 7.287 — ABNORMAL LOW (ref 7.350–7.450)
pO2, Arterial: 120 mmHg — ABNORMAL HIGH (ref 83.0–108.0)

## 2020-11-16 LAB — BASIC METABOLIC PANEL
Anion gap: 7 (ref 5–15)
BUN: 27 mg/dL — ABNORMAL HIGH (ref 8–23)
CO2: 35 mmol/L — ABNORMAL HIGH (ref 22–32)
Calcium: 9 mg/dL (ref 8.9–10.3)
Chloride: 93 mmol/L — ABNORMAL LOW (ref 98–111)
Creatinine, Ser: 0.89 mg/dL (ref 0.61–1.24)
GFR, Estimated: >60 mL/min (ref >60)
Glucose, Bld: 171 mg/dL — ABNORMAL HIGH (ref 70–99)
Potassium: 4.5 mmol/L (ref 3.5–5.1)
Sodium: 135 mmol/L (ref 135–145)

## 2020-11-16 LAB — PROTIME-INR
INR: 1 (ref 0.8–1.2)
Prothrombin Time: 14.7 seconds (ref 11.4–15.2)

## 2020-11-16 SURGERY — RIGHT/LEFT HEART CATH AND CORONARY ANGIOGRAPHY
Anesthesia: LOCAL

## 2020-11-16 MED ORDER — LABETALOL HCL 5 MG/ML IV SOLN
10.0000 mg | INTRAVENOUS | Status: AC | PRN
Start: 2020-11-16 — End: 2020-11-16

## 2020-11-16 MED ORDER — LIDOCAINE HCL (PF) 1 % IJ SOLN
INTRAMUSCULAR | Status: AC
  Filled 2020-11-16: qty 30

## 2020-11-16 MED ORDER — FUROSEMIDE 10 MG/ML IJ SOLN
100.0000 mg | Freq: Two times a day (BID) | INTRAVENOUS | Status: DC
  Filled 2020-11-16 (×3): qty 10

## 2020-11-16 MED ORDER — SODIUM CHLORIDE 0.9 % IV SOLN: INTRAVENOUS | Status: AC

## 2020-11-16 MED ORDER — GUAIFENESIN ER 600 MG PO TB12: 600.0000 mg | ORAL_TABLET | Freq: Two times a day (BID) | ORAL | Status: DC | PRN

## 2020-11-16 MED ORDER — HEPARIN (PORCINE) IN NACL 1000-0.9 UT/500ML-% IV SOLN
INTRAVENOUS | Status: AC
  Filled 2020-11-16: qty 1000

## 2020-11-16 MED ORDER — APIXABAN 5 MG PO TABS
10.0000 mg | ORAL_TABLET | Freq: Once | ORAL | Status: AC
  Administered 2020-11-16: 10 mg via ORAL
  Filled 2020-11-16: qty 2

## 2020-11-16 MED ORDER — SODIUM CHLORIDE 0.9% FLUSH
3.0000 mL | Freq: Two times a day (BID) | INTRAVENOUS | Status: DC
  Administered 2020-11-16 – 2020-11-18 (×3): 3 mL via INTRAVENOUS

## 2020-11-16 MED ORDER — HEPARIN SODIUM (PORCINE) 1000 UNIT/ML IJ SOLN
INTRAMUSCULAR | Status: DC | PRN
  Administered 2020-11-16: 5500 [IU] via INTRAVENOUS

## 2020-11-16 MED ORDER — APIXABAN 5 MG PO TABS
5.0000 mg | ORAL_TABLET | Freq: Two times a day (BID) | ORAL | Status: DC
  Administered 2020-11-17 – 2020-11-18 (×3): 5 mg via ORAL
  Filled 2020-11-16 (×3): qty 1

## 2020-11-16 MED ORDER — HYDRALAZINE HCL 20 MG/ML IJ SOLN: 10.0000 mg | INTRAMUSCULAR | Status: AC | PRN

## 2020-11-16 MED ORDER — SODIUM CHLORIDE 0.9 % IV SOLN: 250.0000 mL | INTRAVENOUS | Status: DC | PRN

## 2020-11-16 MED ORDER — ACETAMINOPHEN 325 MG PO TABS: 650.0000 mg | ORAL_TABLET | ORAL | Status: DC | PRN

## 2020-11-16 MED ORDER — MIDAZOLAM HCL 2 MG/2ML IJ SOLN
INTRAMUSCULAR | Status: AC
  Filled 2020-11-16: qty 2

## 2020-11-16 MED ORDER — VERAPAMIL HCL 2.5 MG/ML IV SOLN
INTRAVENOUS | Status: DC | PRN
  Administered 2020-11-16: 10 mL via INTRA_ARTERIAL

## 2020-11-16 MED ORDER — LIDOCAINE HCL (PF) 1 % IJ SOLN
INTRAMUSCULAR | Status: DC | PRN
  Administered 2020-11-16 (×2): 2 mL via SUBCUTANEOUS

## 2020-11-16 MED ORDER — FENTANYL CITRATE (PF) 100 MCG/2ML IJ SOLN
INTRAMUSCULAR | Status: AC
  Filled 2020-11-16: qty 2

## 2020-11-16 MED ORDER — HEPARIN (PORCINE) IN NACL 1000-0.9 UT/500ML-% IV SOLN
INTRAVENOUS | Status: DC | PRN
  Administered 2020-11-16 (×2): 500 mL

## 2020-11-16 MED ORDER — SODIUM CHLORIDE 0.9 % IV SOLN: INTRAVENOUS | Status: DC

## 2020-11-16 MED ORDER — MIDAZOLAM HCL 2 MG/2ML IJ SOLN
INTRAMUSCULAR | Status: DC | PRN
  Administered 2020-11-16: 2 mg via INTRAVENOUS

## 2020-11-16 MED ORDER — VERAPAMIL HCL 2.5 MG/ML IV SOLN
INTRAVENOUS | Status: AC
  Filled 2020-11-16: qty 2

## 2020-11-16 MED ORDER — ASPIRIN 81 MG PO CHEW: 81.0000 mg | CHEWABLE_TABLET | Freq: Every day | ORAL | Status: DC

## 2020-11-16 MED ORDER — SODIUM CHLORIDE 0.9% FLUSH: 3.0000 mL | INTRAVENOUS | Status: DC | PRN

## 2020-11-16 MED ORDER — HEPARIN SODIUM (PORCINE) 1000 UNIT/ML IJ SOLN
INTRAMUSCULAR | Status: AC
  Filled 2020-11-16: qty 1

## 2020-11-16 MED ORDER — DIAZEPAM 5 MG PO TABS: 5.0000 mg | ORAL_TABLET | Freq: Four times a day (QID) | ORAL | Status: DC | PRN

## 2020-11-16 MED ORDER — ATORVASTATIN CALCIUM 80 MG PO TABS
80.0000 mg | ORAL_TABLET | Freq: Every day | ORAL | Status: DC
  Administered 2020-11-18: 80 mg via ORAL
  Filled 2020-11-16 (×2): qty 1

## 2020-11-16 MED ORDER — FENTANYL CITRATE (PF) 100 MCG/2ML IJ SOLN
INTRAMUSCULAR | Status: DC | PRN
  Administered 2020-11-16: 25 ug via INTRAVENOUS

## 2020-11-16 MED ORDER — IOHEXOL 350 MG/ML SOLN
INTRAVENOUS | Status: DC | PRN
  Administered 2020-11-16: 80 mL

## 2020-11-16 MED ORDER — ONDANSETRON HCL 4 MG/2ML IJ SOLN: 4.0000 mg | Freq: Four times a day (QID) | INTRAMUSCULAR | Status: DC | PRN

## 2020-11-16 MED ORDER — FUROSEMIDE 10 MG/ML IJ SOLN
100.0000 mg | Freq: Once | INTRAVENOUS | Status: AC
  Administered 2020-11-16: 100 mg via INTRAVENOUS
  Filled 2020-11-16: qty 10

## 2020-11-16 SURGICAL SUPPLY — 14 items
CATH BALLN WEDGE 5F 110CM (CATHETERS) ×2 IMPLANT
CATH INFINITI JR4 5F (CATHETERS) ×2 IMPLANT
CATH OPTITORQUE TIG 4.0 5F (CATHETERS) ×2 IMPLANT
DEVICE RAD COMP TR BAND LRG (VASCULAR PRODUCTS) ×2 IMPLANT
GLIDESHEATH SLEND SS 6F .021 (SHEATH) ×2 IMPLANT
GUIDEWIRE INQWIRE 1.5J.035X260 (WIRE) ×1 IMPLANT
INQWIRE 1.5J .035X260CM (WIRE) ×2
KIT HEART LEFT (KITS) ×2 IMPLANT
PACK CARDIAC CATHETERIZATION (CUSTOM PROCEDURE TRAY) ×2 IMPLANT
SHEATH GLIDE SLENDER 4/5FR (SHEATH) ×2 IMPLANT
SHEATH PROBE COVER 6X72 (BAG) ×2 IMPLANT
SYR MEDRAD MARK 7 150ML (SYRINGE) ×2 IMPLANT
TRANSDUCER W/STOPCOCK (MISCELLANEOUS) ×2 IMPLANT
TUBING CIL FLEX 10 FLL-RA (TUBING) ×2 IMPLANT

## 2020-11-16 NOTE — Interval H&P Note (Signed)
Cath Lab Visit (complete for each Cath Lab visit)  Clinical Evaluation Leading to the Procedure:   ACS: No.  Non-ACS:    Anginal Classification: CCS I  Anti-ischemic medical therapy: Minimal Therapy (1 class of medications)  Non-Invasive Test Results: No non-invasive testing performed  Prior CABG: No previous CABG      History and Physical Interval Note:  11/16/2020 8:04 AM  Scott Jensen  has presented today for surgery, with the diagnosis of nstemi.  The various methods of treatment have been discussed with the patient and family. After consideration of risks, benefits and other options for treatment, the patient has consented to  Procedure(s): RIGHT/LEFT HEART CATH AND CORONARY ANGIOGRAPHY (N/A) as a surgical intervention.  The patient's history has been reviewed, patient examined, no change in status, stable for surgery.  I have reviewed the patient's chart and labs.  Questions were answered to the patient's satisfaction.     Shelva Majestic

## 2020-11-16 NOTE — Progress Notes (Signed)
PROGRESS NOTE    Scott Jensen  WSF:[id number] DOB: 1956/03/25 DOA: 11/13/2020 PCP: Mackie Pai, PA-C    Chief Complaint  Patient presents with   Shortness of Breath   Chest Pain    Brief Narrative:   History of COPD (light smoker), OSA not on CPAP, hypertension, chronic polycythemia, pulmonary hypertension , diastolic CHF presented with chest pain short of breath, found to be hypoxic O2 sat 82% on room air on arrival  Subjective: He returned from cardiac cath, he denies chest pain, has intermittent nonproductive cough, he appears comfortable on 2 L oxygen He is currently on Cardizem drip and Lasix drip, he is off heparin drip, he is started on Eliquis today Plan to have cardioversion tomorrow   Assessment & Plan:   Active Problems:   Tobacco abuse disorder   Hypertensive heart disease without CHF   Hyperlipidemia   COPD with acute exacerbation (HCC)   Chest pain   Acute respiratory failure with hypoxia (HCC)   Chronic diastolic CHF (congestive heart failure) (HCC)   Other cirrhosis of liver (HCC)   Atrial flutter (HCC)   Acute hypoxic respiratory failure -Patient reports he was recommended to use oxygen at home but he declined in the past, likely has component of chronic hypoxic respiratory failure, as evidenced by chronic polycythemia - acute short of breath and hypoxic respiratory failure likely from mild COPD exacerbation, acute on chronic diastolic CHF A. fib could also contribute to short of breath -Was on 5 L oxygen, today on 2 L -will need home O2 eval once cardiac issue improves, he likely will need to be discharged on home O2  COPD exacerbation Currently on steroid, DuoNeb, doxycycline Mild scattered wheezing/rhonchi on exam Chest x-ray no acute infiltrate  Chest pain, non-STEMI, Currently on heparin drip, ASA, Lipitor New onset A. fib/a flutter, -Currently on  Cardizem drip, off heparin drip, started on Eliquis on 7/21 Acute on chronic diastolic CHF,  was on IV Lasix, started on Lasix drip on 7/21 Management per cardiology  Diabetes, uncontrolled, with hyperglycemia Does not appear to be on medication at home A1c 7.4% Per cardiology plan to start SGLT T2 inhibitor prior to discharge Currently on insulin sliding scale, may not need insulin once taper off steroid, will monitor  Hypertension Appears on telmisartan and Lasix at home Currently on IV Lasix and spironolactone per cardiology recommendation    Body mass index is 37.74 kg/m.Marland Kitchen Reported history of sleep apnea, he report not ready for CPAP, he would prefer to home oxygen first       Unresulted Labs (From admission, onward)     Start     Ordered   11/17/20 0174  Basic metabolic panel  Tomorrow morning,   R       Question:  Specimen collection method  Answer:  Lab=Lab collect   11/16/20 0939   11/16/20 0500  CBC  Daily,   R     Question:  Specimen collection method  Answer:  Lab=Lab collect   11/15/20 0105   Signed and Held  Protime-INR  Once,   STAT       Question:  Specimen collection method  Answer:  Lab=Lab collect   Signed and Held   Signed and Held  CBC  Tomorrow morning,   R       Question:  Specimen collection method  Answer:  Lab=Lab collect   Signed and Held              DVT prophylaxis: SCD's Start:  11/16/20 0940 SCDs Start: 11/13/20 2330 apixaban (ELIQUIS) tablet 10 mg  apixaban (ELIQUIS) tablet 5 mg   Code Status: DNR Family Communication: Patient Disposition:   Status is: Inpatient  Dispo: The patient is from: Home              Anticipated d/c is to: Home              Anticipated d/c date is: Not ready to discharge, need cardiology clearance               Consultants:  Cardiology Pulmonary  Procedures:  cardiac cath on 7/21 Cardioversion on 7/22  Antimicrobials:    Anti-infectives (From admission, onward)    Start     Dose/Rate Route Frequency Ordered Stop   11/14/20 0030  doxycycline (VIBRA-TABS) tablet 100 mg        100  mg Oral Every 12 hours 11/13/20 2332 11/18/20 2159          Objective: Vitals:   11/16/20 1032 11/16/20 1100 11/16/20 1300 11/16/20 1403  BP: 124/62 118/68 (!) 120/55 (!) 120/55  Pulse: 80 80 82 82  Resp:   18 18  Temp:  97.6 F (36.4 C)    TempSrc:  Oral    SpO2: 96%  94% 94%  Weight:      Height:        Intake/Output Summary (Last 24 hours) at 11/16/2020 1414 Last data filed at 11/16/2020 1300 Gross per 24 hour  Intake 1434.04 ml  Output 500 ml  Net 934.04 ml   Filed Weights   11/14/20 0500 11/15/20 0514 11/16/20 0500  Weight: 115.5 kg 113.1 kg 112.6 kg    Examination:  General exam: calm, NAD Respiratory system: Mild scattered wheezing , no rales , no rhonchi  Respiratory effort normal. Cardiovascular system: S1 & S2 heard, in aflutter Gastrointestinal system: Abdomen is nondistended, soft and nontender. No organomegaly or masses felt. Normal bowel sounds heard. Central nervous system: Alert and oriented. No focal neurological deficits. Extremities: Symmetric 5 x 5 power.  Trace Lower extremity edema  Skin: No rashes, lesions or ulcers Psychiatry: Judgement and insight appear normal. Mood & affect appropriate.     Data Reviewed: I have personally reviewed following labs and imaging studies  CBC: Recent Labs  Lab 11/13/20 1105 11/14/20 0209 11/15/20 1920 11/16/20 1011  WBC 10.3 12.8*  --  10.8*  NEUTROABS 7.2 11.5*  --   --   HGB 19.5* 18.4* 17.6* 18.2*  HCT 60.5* 57.5* 53.9* 56.9*  MCV 94.5 94.7  --  95.2  PLT 178 152  --  357    Basic Metabolic Panel: Recent Labs  Lab 11/13/20 1105 11/13/20 2313 11/14/20 0209 11/16/20 1011  NA 134* 132* 133* 135  K 4.6 4.7 4.7 4.5  CL 95* 95* 94* 93*  CO2 30 30 32 35*  GLUCOSE 162* 230* 196* 171*  BUN 11 7* 6* 27*  CREATININE 0.70 0.69 0.59* 0.89  CALCIUM 9.0 9.1 9.3 9.0  MG  --  1.8 1.8  --   PHOS  --  3.1 2.9  --     GFR: Estimated Creatinine Clearance: 99.4 mL/min (by C-G formula based on SCr of  0.89 mg/dL).  Liver Function Tests: Recent Labs  Lab 11/13/20 2313 11/14/20 0209  AST 19 16  ALT 22 20  ALKPHOS 63 64  BILITOT 1.3* 1.2  PROT 7.4 7.1  ALBUMIN 4.0 3.8    CBG: Recent Labs  Lab 11/15/20 2005 11/16/20 0022  11/16/20 0616 11/16/20 0740 11/16/20 1130  GLUCAP 264* 126* 140* 123* 166*     Recent Results (from the past 240 hour(s))  Resp Panel by RT-PCR (Flu A&B, Covid) Nasopharyngeal Swab     Status: None   Collection Time: 11/13/20 11:05 AM   Specimen: Nasopharyngeal Swab; Nasopharyngeal(NP) swabs in vial transport medium  Result Value Ref Range Status   SARS Coronavirus 2 by RT PCR NEGATIVE NEGATIVE Final    Comment: (NOTE) SARS-CoV-2 target nucleic acids are NOT DETECTED.  The SARS-CoV-2 RNA is generally detectable in upper respiratory specimens during the acute phase of infection. The lowest concentration of SARS-CoV-2 viral copies this assay can detect is 138 copies/mL. A negative result does not preclude SARS-Cov-2 infection and should not be used as the sole basis for treatment or other patient management decisions. A negative result may occur with  improper specimen collection/handling, submission of specimen other than nasopharyngeal swab, presence of viral mutation(s) within the areas targeted by this assay, and inadequate number of viral copies(<138 copies/mL). A negative result must be combined with clinical observations, patient history, and epidemiological information. The expected result is Negative.  Fact Sheet for Patients:  EntrepreneurPulse.com.au  Fact Sheet for Healthcare Providers:  IncredibleEmployment.be  This test is no t yet approved or cleared by the [place] FDA and  has been authorized for detection and/or diagnosis of SARS-CoV-2 by FDA under an Emergency Use Authorization (EUA). This EUA will remain  in effect (meaning this test can be used) for the duration of the COVID-19  declaration under Section 564(b)(1) of the Act, 21 U.S.C.section 360bbb-3(b)(1), unless the authorization is terminated  or revoked sooner.       Influenza A by PCR NEGATIVE NEGATIVE Final   Influenza B by PCR NEGATIVE NEGATIVE Final    Comment: (NOTE) The Xpert Xpress SARS-CoV-2/FLU/RSV plus assay is intended as an aid in the diagnosis of influenza from Nasopharyngeal swab specimens and should not be used as a sole basis for treatment. Nasal washings and aspirates are unacceptable for Xpert Xpress SARS-CoV-2/FLU/RSV testing.  Fact Sheet for Patients: EntrepreneurPulse.com.au  Fact Sheet for Healthcare Providers: IncredibleEmployment.be  This test is not yet approved or cleared by the [place] FDA and has been authorized for detection and/or diagnosis of SARS-CoV-2 by FDA under an Emergency Use Authorization (EUA). This EUA will remain in effect (meaning this test can be used) for the duration of the COVID-19 declaration under Section 564(b)(1) of the Act, 21 U.S.C. section 360bbb-3(b)(1), unless the authorization is terminated or revoked.  Performed at [facility], [address] [place], [place] [address]   MRSA Next Gen by PCR, Nasal     Status: None   Collection Time: 11/13/20 11:27 PM   Specimen: Nasal Mucosa; Nasal Swab  Result Value Ref Range Status   MRSA by PCR Next Gen NOT DETECTED NOT DETECTED Final    Comment: (NOTE) The GeneXpert MRSA Assay (FDA approved for NASAL specimens only), is one component of a comprehensive MRSA colonization surveillance program. It is not intended to diagnose MRSA infection nor to guide or monitor treatment for MRSA infections. Test performance is not FDA approved in patients less than 62 years old. Performed at [facility], [place] [address] [place], [place] [address]          Radiology Studies: CARDIAC CATHETERIZATION  Result Date: 11/16/2020 Formatting of this  result is different from the original.   Mid LM to [place] LAD lesion is 30%  stenosed.   Ost Cx lesion is 30% stenosed.   Ost LAD lesion is 30% stenosed.   Mid LAD lesion is 40% stenosed.   Mid Cx lesion is 30% stenosed.   2nd Mrg lesion is 30% stenosed.   Prox RCA lesion is 30% stenosed.   The left ventricular systolic function is normal.   The left ventricular ejection fraction is 55-65% by visual estimate. 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  There is 40% stenosis in the LAD between the first and second diagonal vessel.  Circumflex vessel has 30% stenosis in the OM vessel and AV groove after the OM1 takeoff.  The RCA is a dominant vessel with 30% smooth proximal to mid narrowing. Normal to hyperdynamic LV function with EF estimate at 60 to 65%.  There is evidence suggesting moderate concentric left ventricular hypertrophy.  There were no focal segmental wall motion abnormalities. Elevated right heart pressures with moderately-severe pulmonary arterial hypertension with PA pressure 74/30, mean 43. Increased PVR at 6.7 WU. RECOMMENDATION: The patient was in atrial flutter throughout the procedure.  Heparin will be resumed 10 hours post procedure.  Initiate aggressive lipid-lowering therapy.  Medical therapy for CAD.  Plan for restoration of sinus rhythm.  Consider pulmonary evaluation for pulmonary arterial hypertension       Scheduled Meds:  albuterol  5 mg/hr Nebulization Once   apixaban  10 mg Oral Once   [START ON 11/17/2020] apixaban  5 mg Oral BID   aspirin EC  81 mg Oral Daily   atorvastatin  80 mg Oral Daily   doxycycline  100 mg Oral Q12H   fluticasone  1 spray Each Nare Daily   folic acid  1 mg Oral Daily   insulin aspart  0-15 Units Subcutaneous Q4H   ipratropium-albuterol  3 mL Nebulization TID   loratadine  10 mg Oral Daily   mometasone-formoterol  2 puff Inhalation BID   predniSONE  40 mg Oral Q breakfast   sodium  chloride flush  3 mL Intravenous Q12H   sodium chloride flush  3 mL Intravenous Q12H   sodium chloride flush  3 mL Intravenous Q12H   spironolactone  25 mg Oral Daily   thiamine  100 mg Oral Daily   Continuous Infusions:  sodium chloride     sodium chloride     sodium chloride     diltiazem (CARDIZEM) infusion 10 mg/hr (11/16/20 0718)   furosemide     furosemide       LOS: 2 days   Time spent: 41mins Greater than 50% of this time was spent in counseling, explanation of diagnosis, planning of further management, and coordination of care.   Voice Recognition Viviann Spare dictation system was used to create this note, attempts have been made to correct errors. Please contact the author with questions and/or clarifications.   Florencia Reasons, MD PhD FACP [facility]  Available via Epic secure chat 7am-7pm for nonurgent issues Please page for urgent issues To page the attending provider between 7A-7P or the covering provider during after hours 7P-7A, please log into the web site [email or web address] and access using universal [place] password for that web site. If you do not have the password, please call the hospital operator.    11/16/2020, 2:14 PM

## 2020-11-16 NOTE — Progress Notes (Addendum)
Cardiology Progress Note  Patient ID: Scott Jensen MRN: [id number] DOB: Apr 30, 1955 Date of Encounter: 11/16/2020  Primary Cardiologist: None  Subjective   Chief Complaint: Shortness of breath  HPI: Nonobstructive CAD.  Moderate pulmonary hypertension.  Elevated capillary wedge pressure.  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  Still volume up.  Remains in atrial flutter.  TEE/cardioversion tomorrow.  ROS:  All other ROS reviewed and negative. Pertinent positives noted in the HPI.     Inpatient Medications  Scheduled Meds:  albuterol  5 mg/hr Nebulization Once   aspirin EC  81 mg Oral Daily   atorvastatin  80 mg Oral Daily   doxycycline  100 mg Oral Q12H   fluticasone  1 spray Each Nare Daily   folic acid  1 mg Oral Daily   furosemide  60 mg Intravenous BID   insulin aspart  0-15 Units Subcutaneous Q4H   ipratropium-albuterol  3 mL Nebulization TID   loratadine  10 mg Oral Daily   mometasone-formoterol  2 puff Inhalation BID   predniSONE  40 mg Oral Q breakfast   sodium chloride flush  3 mL Intravenous Q12H   sodium chloride flush  3 mL Intravenous Q12H   sodium chloride flush  3 mL Intravenous Q12H   spironolactone  25 mg Oral Daily   thiamine  100 mg Oral Daily   Continuous Infusions:  sodium chloride     sodium chloride     sodium chloride     diltiazem (CARDIZEM) infusion 10 mg/hr (11/16/20 0718)   heparin 2,100 Units/hr (11/16/20 0409)   PRN Meds: sodium chloride, sodium chloride, acetaminophen, albuterol, diazepam, fentaNYL (SUBLIMAZE) injection, hydrALAZINE, labetalol, nitroGLYCERIN, ondansetron (ZOFRAN) IV, sodium chloride flush, sodium chloride flush, traMADol   Vital Signs   Vitals:   11/16/20 0931 11/16/20 0947 11/16/20 1002 11/16/20 1017  BP: 103/62 (!) 105/58 115/64 112/60  Pulse: 80 81 80 80  Resp: 16     Temp:      TempSrc:      SpO2: 95% 94% 93% 96%  Weight:      Height:        Intake/Output Summary (Last 24 hours) at 11/16/2020  1027 Last data filed at 11/16/2020 0400 Gross per 24 hour  Intake 1275.2 ml  Output 1125 ml  Net 150.2 ml   Last 3 Weights 11/16/2020 11/15/2020 11/14/2020  Weight (lbs) 248 lb 3.2 oz 249 lb 4.8 oz 254 lb 10.1 oz  Weight (kg) 112.583 kg 113.082 kg 115.5 kg      Telemetry  Overnight telemetry shows atrial flutter in the 80s, which I personally reviewed.   Physical Exam   Vitals:   11/16/20 0931 11/16/20 0947 11/16/20 1002 11/16/20 1017  BP: 103/62 (!) 105/58 115/64 112/60  Pulse: 80 81 80 80  Resp: 16     Temp:      TempSrc:      SpO2: 95% 94% 93% 96%  Weight:      Height:        Intake/Output Summary (Last 24 hours) at 11/16/2020 1027 Last data filed at 11/16/2020 0400 Gross per 24 hour  Intake 1275.2 ml  Output 1125 ml  Net 150.2 ml    Last 3 Weights 11/16/2020 11/15/2020 11/14/2020  Weight (lbs) 248 lb 3.2 oz 249 lb 4.8 oz 254 lb 10.1 oz  Weight (kg) 112.583 kg 113.082 kg 115.5 kg    Body mass index is 37.74 kg/m.  General: Well nourished, well developed, in no acute distress Head: Atraumatic, normal  size  Eyes: PEERLA, EOMI  Neck: Supple, JVD 7 8 cm of water Endocrine: No thryomegaly Cardiac: Normal S1, S2; irregular rhythm, no murmurs Lungs: Crackles at the lung bases, diffuse rhonchi and wheezing Abd: Soft, nontender, no hepatomegaly  Ext: 1+ pitting edema up to the mid shin Musculoskeletal: No deformities, BUE and BLE strength normal and equal Skin: Warm and dry, no rashes   Neuro: Alert and oriented to person, place, time, and situation, CNII-XII grossly intact, no focal deficits  Psych: Normal mood and affect   Labs  High Sensitivity Troponin:   Recent Labs  Lab 11/13/20 1105 11/13/20 1348 11/14/20 1959 11/14/20 2201 11/14/20 2329  TROPONINIHS 18* 15 15 45* 65*     Cardiac EnzymesNo results for input(s): TROPONINI in the last 168 hours. No results for input(s): TROPIPOC in the last 168 hours.  Chemistry Recent Labs  Lab 11/13/20 1105 11/13/20 2313  11/14/20 0209  NA 134* 132* 133*  K 4.6 4.7 4.7  CL 95* 95* 94*  CO2 30 30 32  GLUCOSE 162* 230* 196*  BUN 11 7* 6*  CREATININE 0.70 0.69 0.59*  CALCIUM 9.0 9.1 9.3  PROT  --  7.4 7.1  ALBUMIN  --  4.0 3.8  AST  --  19 16  ALT  --  22 20  ALKPHOS  --  63 64  BILITOT  --  1.3* 1.2  GFRNONAA >60 >60 >60  ANIONGAP 9 7 7     Hematology Recent Labs  Lab 11/13/20 1105 11/14/20 0209 11/15/20 1920  WBC 10.3 12.8*  --   RBC 6.40* 6.07*  --   HGB 19.5* 18.4* 17.6*  HCT 60.5* 57.5* 53.9*  MCV 94.5 94.7  --   MCH 30.5 30.3  --   MCHC 32.2 32.0  --   RDW 15.0 14.6  --   PLT 178 152  --    BNP Recent Labs  Lab 11/13/20 1105  BNP 136.4*    DDimer No results for input(s): DDIMER in the last 168 hours.   Radiology  CARDIAC CATHETERIZATION  Result Date: 11/16/2020 Formatting of this result is different from the original.   Mid LM to [place] LAD lesion is 30% stenosed.   Ost Cx lesion is 30% stenosed.   Ost LAD lesion is 30% stenosed.   Mid LAD lesion is 40% stenosed.   Mid Cx lesion is 30% stenosed.   2nd Mrg lesion is 30% stenosed.   Prox RCA lesion is 30% stenosed.   The left ventricular systolic function is normal.   The left ventricular ejection fraction is 55-65% by visual estimate. 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  There is 40% stenosis in the LAD between the first and second diagonal vessel.  Circumflex vessel has 30% stenosis in the OM vessel and AV groove after the OM1 takeoff.  The RCA is a dominant vessel with 30% smooth proximal to mid narrowing. Normal to hyperdynamic LV function with EF estimate at 60 to 65%.  There is evidence suggesting moderate concentric left ventricular hypertrophy.  There were no focal segmental wall motion abnormalities. Elevated right heart pressures with moderately-severe pulmonary arterial hypertension with PA pressure 74/30, mean 43. Increased PVR at 6.7 WU. RECOMMENDATION: The  patient was in atrial flutter throughout the procedure.  Heparin will be resumed 10 hours post procedure.  Initiate aggressive lipid-lowering therapy.  Medical therapy for CAD.  Plan for restoration of sinus rhythm.  Consider  pulmonary evaluation for pulmonary arterial hypertension   Cardiac Studies  LHC 11/16/2020    Mid LM to Ost LAD lesion is 30% stenosed.   Ost Cx lesion is 30% stenosed.   Ost LAD lesion is 30% stenosed.   Mid LAD lesion is 40% stenosed.   Mid Cx lesion is 30% stenosed.   2nd Mrg lesion is 30% stenosed.   Prox RCA lesion is 30% stenosed.   The left ventricular systolic function is normal.   The left ventricular ejection fraction is 55-65% by visual estimate.  RHC RA: 23/25/20 RV: 67/18 PA 74/30/43 Pulmonary capillary wedge pressure 18 Transpulmonary gradient: 25 PVR: 6.7 Wood units  TTE 11/14/2020   1. Left ventricular ejection fraction, by estimation, is 65 to 70%. The  left ventricle has normal function. The left ventricle has no regional  wall motion abnormalities. There is moderate concentric left ventricular  hypertrophy. Left ventricular  diastolic parameters are consistent with Grade I diastolic dysfunction  (impaired relaxation). There is the interventricular septum is flattened  in systole, consistent with right ventricular pressure overload.   2. Right ventricular systolic function is mildly reduced. The right  ventricular size is mildly enlarged. Mildly increased right ventricular  wall thickness.   3. Left atrial size was mild to moderately dilated.   4. Right atrial size was moderately dilated.   5. The mitral valve is normal in structure. No evidence of mitral valve  regurgitation.   6. The aortic valve is tricuspid. There is mild calcification of the  aortic valve. Aortic valve regurgitation is not visualized. Mild aortic  valve sclerosis is present, with no evidence of aortic valve stenosis.   7. The inferior vena cava is dilated in size  with <50% respiratory  variability, suggesting right atrial pressure of 15 mmHg.   Patient Profile  65 year old male with history of hypertension, hyperlipidemia, diabetes (A1c 7.4, on no medications at home), COPD, prior tobacco abuse, OSA, morbid obesity who was admitted on 11/13/2020 with acute chest pain and found to have acute on chronic hypercapnic/hypoxic respiratory failure secondary to COPD exacerbation and likely diastolic heart failure.  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  Has known COPD.  Remains volume overloaded. -Left heart cath shows nonobstructive CAD. -Suspect symptoms are related to COPD and right heart failure as well as diastolic heart failure. -Elevated pulmonary capillary wedge pressure.  He also has very elevated pulmonary pressures out of proportion to his left heart disease.  I suspect this is mixed group 2 and group 3 pulmonary hypertension. He has untreated COPD/OSA that is now being treated.  -We will continue with aggressive diuresis.  Increase Lasix to 100 mg IV twice daily. -Treatment for his right heart failure from lung diseases oxygen therapy.  Could be considered for PDE 5 inhibitor as an outpatient.  His oxygen therapy has been undertreated. -BP stable. -Aldactone added to help with potassium  2.  Nonobstructive CAD -Continue aspirin and statin therapy.  LDL 82.  Would continue Lipitor 80 mg daily.  Needs aggressive LDL reduction.  3.  Hypertension -BP stable.  Continue Aldactone 25 mg daily.  4.  Atrial flutter -This is a right heart problem.  Suspect his pulm hypertension and RV failure has led him to have developed atrial flutter. -We will plan to start Eliquis 10 mg tonight as a loading dose.  Then start 5 mg twice daily tomorrow morning. -Continue diltiazem drip for  now. -We will plan for TEE/cardioversion tomorrow.  For questions or updates, please contact Horace Please consult [email or web address] for contact info under   Time Spent with Patient: I have spent a total of 35 minutes with patient reviewing hospital notes, telemetry, EKGs, labs and examining the patient as well as establishing an assessment and plan that was discussed with the patient.  > 50% of time was spent in direct patient care.    Signed, Addison Naegeli. Audie Box, MD, [place]  11/16/2020 10:27 AM

## 2020-11-17 ENCOUNTER — Other Ambulatory Visit (HOSPITAL_COMMUNITY): Payer: Medicare Other

## 2020-11-17 ENCOUNTER — Encounter (HOSPITAL_COMMUNITY)
Admission: EM | Disposition: A | Discharge status: Home / Self Care | Payer: Self-pay | Source: Home / Self Care | Attending: Internal Medicine

## 2020-11-17 ENCOUNTER — Other Ambulatory Visit (HOSPITAL_COMMUNITY): Payer: Self-pay

## 2020-11-17 ENCOUNTER — Encounter (HOSPITAL_COMMUNITY): Payer: Self-pay | Admitting: Internal Medicine

## 2020-11-17 ENCOUNTER — Inpatient Hospital Stay (HOSPITAL_COMMUNITY): Payer: Medicare Other

## 2020-11-17 ENCOUNTER — Inpatient Hospital Stay (HOSPITAL_COMMUNITY): Payer: Medicare Other | Admitting: Certified Registered Nurse Anesthetist

## 2020-11-17 DIAGNOSIS — I4892 Unspecified atrial flutter: Secondary | ICD-10-CM

## 2020-11-17 DIAGNOSIS — I483 Typical atrial flutter: Secondary | ICD-10-CM | POA: Diagnosis not present

## 2020-11-17 DIAGNOSIS — J9601 Acute respiratory failure with hypoxia: Secondary | ICD-10-CM | POA: Diagnosis not present

## 2020-11-17 DIAGNOSIS — I272 Pulmonary hypertension, unspecified: Secondary | ICD-10-CM | POA: Diagnosis not present

## 2020-11-17 DIAGNOSIS — I5032 Chronic diastolic (congestive) heart failure: Secondary | ICD-10-CM | POA: Diagnosis not present

## 2020-11-17 DIAGNOSIS — I351 Nonrheumatic aortic (valve) insufficiency: Secondary | ICD-10-CM

## 2020-11-17 HISTORY — PX: CARDIOVERSION: SHX1299

## 2020-11-17 HISTORY — PX: TEE WITHOUT CARDIOVERSION: SHX5443

## 2020-11-17 LAB — GLUCOSE, CAPILLARY
Glucose-Capillary: 102 mg/dL — ABNORMAL HIGH (ref 70–99)
Glucose-Capillary: 129 mg/dL — ABNORMAL HIGH (ref 70–99)
Glucose-Capillary: 131 mg/dL — ABNORMAL HIGH (ref 70–99)
Glucose-Capillary: 135 mg/dL — ABNORMAL HIGH (ref 70–99)
Glucose-Capillary: 156 mg/dL — ABNORMAL HIGH (ref 70–99)
Glucose-Capillary: 213 mg/dL — ABNORMAL HIGH (ref 70–99)
Glucose-Capillary: 258 mg/dL — ABNORMAL HIGH (ref 70–99)

## 2020-11-17 LAB — CBC
HCT: 57.8 % — ABNORMAL HIGH (ref 39.0–52.0)
Hemoglobin: 18.6 g/dL — ABNORMAL HIGH (ref 13.0–17.0)
MCH: 30.2 pg (ref 26.0–34.0)
MCHC: 32.2 g/dL (ref 30.0–36.0)
MCV: 93.8 fL (ref 80.0–100.0)
Platelets: 153 10*3/uL (ref 150–400)
RBC: 6.16 MIL/uL — ABNORMAL HIGH (ref 4.22–5.81)
RDW: 14.5 % (ref 11.5–15.5)
WBC: 7.2 10*3/uL (ref 4.0–10.5)
nRBC: 0 % (ref 0.0–0.2)

## 2020-11-17 LAB — BASIC METABOLIC PANEL
Anion gap: 8 (ref 5–15)
BUN: 24 mg/dL — ABNORMAL HIGH (ref 8–23)
CO2: 35 mmol/L — ABNORMAL HIGH (ref 22–32)
Calcium: 9.1 mg/dL (ref 8.9–10.3)
Chloride: 92 mmol/L — ABNORMAL LOW (ref 98–111)
Creatinine, Ser: 0.79 mg/dL (ref 0.61–1.24)
GFR, Estimated: >60 mL/min (ref >60)
Glucose, Bld: 145 mg/dL — ABNORMAL HIGH (ref 70–99)
Potassium: 4.6 mmol/L (ref 3.5–5.1)
Sodium: 135 mmol/L (ref 135–145)

## 2020-11-17 SURGERY — ECHOCARDIOGRAM, TRANSESOPHAGEAL
Anesthesia: General

## 2020-11-17 MED ORDER — ASPIRIN 81 MG PO TBEC
81.0000 mg | DELAYED_RELEASE_TABLET | Freq: Every day | ORAL | 11 refills | Status: AC
  Filled 2020-11-17: qty 30, 30d supply, fill #0

## 2020-11-17 MED ORDER — TORSEMIDE 20 MG PO TABS
20.0000 mg | ORAL_TABLET | Freq: Every day | ORAL | 0 refills | Status: DC
  Filled 2020-11-17: qty 30, 30d supply, fill #0

## 2020-11-17 MED ORDER — PROPOFOL 500 MG/50ML IV EMUL
INTRAVENOUS | Status: DC | PRN
  Administered 2020-11-17: 100 ug/kg/min via INTRAVENOUS

## 2020-11-17 MED ORDER — SPIRONOLACTONE 25 MG PO TABS
25.0000 mg | ORAL_TABLET | Freq: Every day | ORAL | 0 refills | Status: DC
  Filled 2020-11-17: qty 30, 30d supply, fill #0

## 2020-11-17 MED ORDER — ATORVASTATIN CALCIUM 80 MG PO TABS
80.0000 mg | ORAL_TABLET | Freq: Every day | ORAL | 0 refills | Status: DC
  Filled 2020-11-17: qty 30, 30d supply, fill #0

## 2020-11-17 MED ORDER — PROPOFOL 10 MG/ML IV BOLUS
INTRAVENOUS | Status: DC | PRN
  Administered 2020-11-17: 20 mg via INTRAVENOUS
  Administered 2020-11-17: 10 mg via INTRAVENOUS

## 2020-11-17 MED ORDER — BUTAMBEN-TETRACAINE-BENZOCAINE 2-2-14 % EX AERO
INHALATION_SPRAY | CUTANEOUS | Status: DC | PRN
  Administered 2020-11-17: 2 via TOPICAL

## 2020-11-17 MED ORDER — APIXABAN 5 MG PO TABS
5.0000 mg | ORAL_TABLET | Freq: Two times a day (BID) | ORAL | 0 refills | Status: DC
  Filled 2020-11-17: qty 60, 30d supply, fill #0

## 2020-11-17 MED ORDER — LIDOCAINE 2% (20 MG/ML) 5 ML SYRINGE
INTRAMUSCULAR | Status: DC | PRN
Start: 2020-11-17 — End: 2020-11-17
  Administered 2020-11-17: 100 mg via INTRAVENOUS

## 2020-11-17 MED ORDER — TORSEMIDE 20 MG PO TABS
20.0000 mg | ORAL_TABLET | Freq: Every day | ORAL | Status: DC
  Administered 2020-11-17 – 2020-11-18 (×2): 20 mg via ORAL
  Filled 2020-11-17 (×2): qty 1

## 2020-11-17 NOTE — Interval H&P Note (Signed)
History and Physical Interval Note:  11/17/2020 11:10 AM  Scott Jensen  has presented today for surgery, with the diagnosis of A FLUTTER.  The various methods of treatment have been discussed with the patient and family. After consideration of risks, benefits and other options for treatment, the patient has consented to  Procedure(s): TRANSESOPHAGEAL ECHOCARDIOGRAM (TEE) (N/A) CARDIOVERSION (N/A) as a surgical intervention.  The patient's history has been reviewed, patient examined, no change in status, stable for surgery.  I have reviewed the patient's chart and labs.  Questions were answered to the patient's satisfaction.     Pixie Casino

## 2020-11-17 NOTE — Anesthesia Preprocedure Evaluation (Addendum)
Anesthesia Evaluation  Patient identified by MRN, date of birth, ID band  Reviewed: Allergy & Precautions, NPO status   Airway Mallampati: II  TM Distance: >3 FB     Dental   Pulmonary sleep apnea , COPD, Current Smoker and Patient abstained from smoking.,    breath sounds clear to auscultation       Cardiovascular hypertension, + CAD and +CHF   Rhythm:Irregular Rate:Normal     Neuro/Psych    GI/Hepatic negative GI ROS, Neg liver ROS,   Endo/Other  negative endocrine ROS  Renal/GU negative Renal ROS     Musculoskeletal   Abdominal   Peds  Hematology   Anesthesia Other Findings   Reproductive/Obstetrics                             Anesthesia Physical Anesthesia Plan  ASA: 3  Anesthesia Plan: General   Post-op Pain Management:    Induction: Intravenous  PONV Risk Score and Plan: Treatment may vary due to age or medical condition  Airway Management Planned: Nasal Cannula and Simple Face Mask  Additional Equipment:   Intra-op Plan:   Post-operative Plan:   Informed Consent: I have reviewed the patients History and Physical, chart, labs and discussed the procedure including the risks, benefits and alternatives for the proposed anesthesia with the patient or authorized representative who has indicated his/her understanding and acceptance.     Dental advisory given  Plan Discussed with: CRNA and Anesthesiologist  Anesthesia Plan Comments:         Anesthesia Quick Evaluation

## 2020-11-17 NOTE — TOC Benefit Eligibility Note (Signed)
Patient Teacher, English as a foreign language completed.    The patient is currently admitted and upon discharge could be taking Eliquis 5 mg.  Non Formulary  The patient is currently admitted and upon discharge could be taking Xarelto 20 mg.  The current 30 day co-pay is, $. 442.72 due to a $480.00 deductible  The patient is insured through [employer], [place] Patient Advocate Specialist [place] Team Direct Number: [phone]  Fax: [phone]

## 2020-11-17 NOTE — Progress Notes (Signed)
PROGRESS NOTE    Scott Jensen  [id number] DOB: 05-29-1955 DOA: 11/13/2020 PCP: Mackie Pai, PA-C    Chief Complaint  Patient presents with   Shortness of Breath   Chest Pain    Brief Narrative:   History of COPD (light smoker), OSA not on CPAP, hypertension, chronic polycythemia, pulmonary hypertension , diastolic CHF presented with chest pain short of breath, found to be hypoxic O2 sat 82% on room air on arrival  Subjective:   He declined cpap last night  He returned from cardioversion , he is now back to sinus rhythm ,he denies chest pain, no cough ,  he appears comfortable on 2 L oxygen, O2 sats dropped to 81 while ambulating on room air Report lower extremity edema has much improved  Assessment & Plan:   Active Problems:   Tobacco abuse disorder   Hypertensive heart disease without CHF   Hyperlipidemia   COPD with acute exacerbation (HCC)   Chest pain   Acute respiratory failure with hypoxia (HCC)   Chronic diastolic CHF (congestive heart failure) (HCC)   Other cirrhosis of liver (HCC)   Atrial flutter (HCC)   Acute hypoxic respiratory failure -Patient reports he was recommended to use oxygen at home but he declined in the past, likely has component of chronic hypoxic respiratory failure, as evidenced by chronic polycythemia - acute short of breath and hypoxic respiratory failure likely from mild COPD exacerbation, acute on chronic diastolic CHF A. fib could also contribute to short of breath -Was on 5 L oxygen, today on 2 L -home O2 eval today after cardioversion, he meets home o2 criteria, home o2 ordered,     COPD exacerbation Currently on steroid, DuoNeb, doxycycline Mild scattered wheezing/rhonchi on exam has much improved Chest x-ray no acute infiltrate  Chest pain, non-STEMI, Currently on heparin drip, ASA, Lipitor New onset A. fib/a flutter, -was on  Cardizem drip, s/p cardioversion, now sinus rhythm,  off heparin drip, started on Eliquis on  7/21 Acute on chronic diastolic CHF, was on IV Lasix then Lasix drip , now on demadex Management per cardiology  Diabetes, uncontrolled, with hyperglycemia Does not appear to be on medication at home A1c 7.4% Per cardiology plan to start SGLT2 inhibitor prior to discharge Currently on insulin sliding scale, may not need insulin once taper off steroid, will monitor  Hypertension Appears on telmisartan and Lasix at home Now on Currently on demadex and spironolactone per cardiology recommendation      Body mass index is 37.21 kg/m.Marland Kitchen Reported history of sleep apnea, he report not ready for CPAP, he would prefer to home oxygen first       Unresulted Labs (From admission, onward)     Start     Ordered   11/16/20 0500  CBC  Daily,   R     Question:  Specimen collection method  Answer:  Lab=Lab collect   11/15/20 0105   Signed and Held  CBC  Tomorrow morning,   R       Question:  Specimen collection method  Answer:  Lab=Lab collect   Signed and Held              DVT prophylaxis: SCD's Start: 11/16/20 0940 SCDs Start: 11/13/20 2330 apixaban (ELIQUIS) tablet 5 mg   Code Status: DNR Family Communication: Patient Disposition:   Status is: Inpatient  Dispo: The patient is from: Home              Anticipated d/c is to: Home tomorrow  Anticipated d/c date is: need to arrange home o2, then home tomorrow               Consultants:  Cardiology Pulmonary  Procedures:  cardiac cath on 7/21 Cardioversion on 7/22  Antimicrobials:    Anti-infectives (From admission, onward)    Start     Dose/Rate Route Frequency Ordered Stop   11/14/20 0030  doxycycline (VIBRA-TABS) tablet 100 mg        100 mg Oral Every 12 hours 11/13/20 2332 11/18/20 2159          Objective: Vitals:   11/17/20 1238 11/17/20 1248 11/17/20 1300 11/17/20 1619  BP: 125/70 134/65  (!) 134/58  Pulse: 87 87 88 88  Resp: '16 17  19  '$ Temp:    (!) 97.5 F (36.4 C)  TempSrc:    Oral   SpO2: 97% 96% 99% 96%  Weight:      Height:        Intake/Output Summary (Last 24 hours) at 11/17/2020 1751 Last data filed at 11/17/2020 1700 Gross per 24 hour  Intake 1040.39 ml  Output 5200 ml  Net -4159.61 ml   Filed Weights   11/16/20 0500 11/17/20 0409 11/17/20 1103  Weight: 112.6 kg 111 kg 111 kg    Examination:  General exam: calm, NAD Respiratory system: Mild scattered wheezing , no rales , no rhonchi  Respiratory effort normal. Cardiovascular system: S1 & S2 heard, in aflutter Gastrointestinal system: Abdomen is nondistended, soft and nontender. No organomegaly or masses felt. Normal bowel sounds heard. Central nervous system: Alert and oriented. No focal neurological deficits. Extremities: Symmetric 5 x 5 power.   Lower extremity edema has much improved  Skin: No rashes, lesions or ulcers Psychiatry: Judgement and insight appear normal. Mood & affect appropriate.     Data Reviewed: I have personally reviewed following labs and imaging studies  CBC: Recent Labs  Lab 11/13/20 1105 11/14/20 0209 11/15/20 1920 11/16/20 0834 11/16/20 0842 11/16/20 1011 11/17/20 0306  WBC 10.3 12.8*  --   --   --  10.8* 7.2  NEUTROABS 7.2 11.5*  --   --   --   --   --   HGB 19.5* 18.4* 17.6* 20.1*  20.1* 20.1* 18.2* 18.6*  HCT 60.5* 57.5* 53.9* 59.0*  59.0* 59.0* 56.9* 57.8*  MCV 94.5 94.7  --   --   --  95.2 93.8  PLT 178 152  --   --   --  156 0000000    Basic Metabolic Panel: Recent Labs  Lab 11/13/20 1105 11/13/20 2313 11/14/20 0209 11/16/20 0834 11/16/20 0842 11/16/20 1011 11/17/20 0306  NA 134* 132* 133* 137  137 136 135 135  K 4.6 4.7 4.7 4.5  4.4 4.5 4.5 4.6  CL 95* 95* 94*  --   --  93* 92*  CO2 30 30 32  --   --  35* 35*  GLUCOSE 162* 230* 196*  --   --  171* 145*  BUN 11 7* 6*  --   --  27* 24*  CREATININE 0.70 0.69 0.59*  --   --  0.89 0.79  CALCIUM 9.0 9.1 9.3  --   --  9.0 9.1  MG  --  1.8 1.8  --   --   --   --   PHOS  --  3.1 2.9  --   --   --    --     GFR: Estimated Creatinine Clearance: 109.7 mL/min (  by C-G formula based on SCr of 0.79 mg/dL).  Liver Function Tests: Recent Labs  Lab 11/13/20 2313 11/14/20 0209  AST 19 16  ALT 22 20  ALKPHOS 63 64  BILITOT 1.3* 1.2  PROT 7.4 7.1  ALBUMIN 4.0 3.8    CBG: Recent Labs  Lab 11/17/20 0401 11/17/20 0812 11/17/20 1311 11/17/20 1551 11/17/20 1619  GLUCAP 156* 135* 129* 258* 213*     Recent Results (from the past 240 hour(s))  Resp Panel by RT-PCR (Flu A&B, Covid) Nasopharyngeal Swab     Status: None   Collection Time: 11/13/20 11:05 AM   Specimen: Nasopharyngeal Swab; Nasopharyngeal(NP) swabs in vial transport medium  Result Value Ref Range Status   SARS Coronavirus 2 by RT PCR NEGATIVE NEGATIVE Final    Comment: (NOTE) SARS-CoV-2 target nucleic acids are NOT DETECTED.  The SARS-CoV-2 RNA is generally detectable in upper respiratory specimens during the acute phase of infection. The lowest concentration of SARS-CoV-2 viral copies this assay can detect is 138 copies/mL. A negative result does not preclude SARS-Cov-2 infection and should not be used as the sole basis for treatment or other patient management decisions. A negative result may occur with  improper specimen collection/handling, submission of specimen other than nasopharyngeal swab, presence of viral mutation(s) within the areas targeted by this assay, and inadequate number of viral copies(<138 copies/mL). A negative result must be combined with clinical observations, patient history, and epidemiological information. The expected result is Negative.  Fact Sheet for Patients:  EntrepreneurPulse.com.au  Fact Sheet for Healthcare Providers:  IncredibleEmployment.be  This test is no t yet approved or cleared by the [place] FDA and  has been authorized for detection and/or diagnosis of SARS-CoV-2 by FDA under an Emergency Use Authorization (EUA). This EUA  will remain  in effect (meaning this test can be used) for the duration of the COVID-19 declaration under Section 564(b)(1) of the Act, 21 U.S.C.section 360bbb-3(b)(1), unless the authorization is terminated  or revoked sooner.       Influenza A by PCR NEGATIVE NEGATIVE Final   Influenza B by PCR NEGATIVE NEGATIVE Final    Comment: (NOTE) The Xpert Xpress SARS-CoV-2/FLU/RSV plus assay is intended as an aid in the diagnosis of influenza from Nasopharyngeal swab specimens and should not be used as a sole basis for treatment. Nasal washings and aspirates are unacceptable for Xpert Xpress SARS-CoV-2/FLU/RSV testing.  Fact Sheet for Patients: EntrepreneurPulse.com.au  Fact Sheet for Healthcare Providers: IncredibleEmployment.be  This test is not yet approved or cleared by the [place] FDA and has been authorized for detection and/or diagnosis of SARS-CoV-2 by FDA under an Emergency Use Authorization (EUA). This EUA will remain in effect (meaning this test can be used) for the duration of the COVID-19 declaration under Section 564(b)(1) of the Act, 21 U.S.C. section 360bbb-3(b)(1), unless the authorization is terminated or revoked.  Performed at [facility], [place] [place], [place] [address]   MRSA Next Gen by PCR, Nasal     Status: None   Collection Time: 11/13/20 11:27 PM   Specimen: Nasal Mucosa; Nasal Swab  Result Value Ref Range Status   MRSA by PCR Next Gen NOT DETECTED NOT DETECTED Final    Comment: (NOTE) The GeneXpert MRSA Assay (FDA approved for NASAL specimens only), is one component of a comprehensive MRSA colonization surveillance program. It is not intended to diagnose MRSA infection nor to guide or monitor treatment for MRSA infections. Test performance is not FDA approved  in patients less than 51 years old. Performed at [facility], [place] [address] [facility], [place] [address]           Radiology Studies: CARDIAC CATHETERIZATION  Result Date: 11/16/2020 Formatting of this result is different from the original.   Mid LM to [facility] LAD lesion is 30% stenosed.   Ost Cx lesion is 30% stenosed.   Ost LAD lesion is 30% stenosed.   Mid LAD lesion is 40% stenosed.   Mid Cx lesion is 30% stenosed.   2nd Mrg lesion is 30% stenosed.   Prox RCA lesion is 30% stenosed.   The left ventricular systolic function is normal.   The left ventricular ejection fraction is 55-65% by visual estimate. 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  There is 40% stenosis in the LAD between the first and second diagonal vessel.  Circumflex vessel has 30% stenosis in the OM vessel and AV groove after the OM1 takeoff.  The RCA is a dominant vessel with 30% smooth proximal to mid narrowing. Normal to hyperdynamic LV function with EF estimate at 60 to 65%.  There is evidence suggesting moderate concentric left ventricular hypertrophy.  There were no focal segmental wall motion abnormalities. Elevated right heart pressures with moderately-severe pulmonary arterial hypertension with PA pressure 74/30, mean 43. Increased PVR at 6.7 WU. RECOMMENDATION: The patient was in atrial flutter throughout the procedure.  Heparin will be resumed 10 hours post procedure.  Initiate aggressive lipid-lowering therapy.  Medical therapy for CAD.  Plan for restoration of sinus rhythm.  Consider pulmonary evaluation for pulmonary arterial hypertension  ECHO TEE  Result Date: 11/17/2020    TRANSESOPHOGEAL ECHO REPORT   Patient Name:   CLINT Georg Date of Exam: 11/17/2020 Medical Rec #:  [id number]      Height:       68.0 in Accession #:    [id number]     Weight:       244.7 lb Date of Birth:  [birth year]       BSA:          2.227 m Patient Age:    65 years       BP:           146/79 mmHg Patient Gender: M              HR:           81 bpm. Exam Location:  Inpatient Procedure:  Transesophageal Echo, Cardiac Doppler and Color Doppler Indications:     I48.92* Unspecified atrial flutter  History:         Patient has prior history of Echocardiogram examinations, most                  recent 11/14/2020. Cardiomegaly and CHF, CAD, Abnormal ECG,                  COPD, Arrythmias:Atrial Flutter, Signs/Symptoms:Chest Pain and                  Dyspnea; Risk Factors:Dyslipidemia, Current Smoker and                  Hypertension.  Sonographer:     Roseanna Rainbow RDCS Referring Phys:  [id number] Darreld Mclean Diagnosing Phys: Lyman Bishop MD PROCEDURE: After discussion of the risks and benefits of a TEE, an informed consent was obtained from the patient. The transesophogeal probe was passed without  difficulty through the esophogus of the patient. Imaged were obtained with the patient in a supine position. Sedation performed by different physician. The patient was monitored while under deep sedation. Anesthestetic sedation was provided intravenously by Anesthesiology: 196.'5mg'$  of Propofol, '100mg'$  of Lidocaine. The patient developed Respiratory depression during the procedure. A successful direct current cardioversion was performed at 120 joules with 1 attempt. IMPRESSIONS  1. Left ventricular ejection fraction, by estimation, is 65 to 70%. The left ventricle has normal function. There is moderate left ventricular hypertrophy.  2. Right ventricular systolic function is normal. The right ventricular size is mildly enlarged.  3. Left atrial size was moderately dilated. No left atrial/left atrial appendage thrombus was detected.  4. Right atrial size was mildly dilated.  5. The mitral valve is grossly normal. Trivial mitral valve regurgitation.  6. The aortic valve is tricuspid. Aortic valve regurgitation is mild to moderate. Mild aortic valve sclerosis is present, with no evidence of aortic valve stenosis. Conclusion(s)/Recommendation(s): No LA/LAA thrombus identified. Successful cardioversion performed with  restoration of normal sinus rhythm. FINDINGS  Left Ventricle: Left ventricular ejection fraction, by estimation, is 65 to 70%. The left ventricle has normal function. The left ventricular internal cavity size was normal in size. There is moderate left ventricular hypertrophy. Right Ventricle: The right ventricular size is mildly enlarged. No increase in right ventricular wall thickness. Right ventricular systolic function is normal. Left Atrium: Left atrial size was moderately dilated. No left atrial/left atrial appendage thrombus was detected. Right Atrium: Right atrial size was mildly dilated. Pericardium: There is no evidence of pericardial effusion. Mitral Valve: The mitral valve is grossly normal. Trivial mitral valve regurgitation. Tricuspid Valve: The tricuspid valve is grossly normal. Tricuspid valve regurgitation is mild. Aortic Valve: The aortic valve is tricuspid. Aortic valve regurgitation is mild to moderate. Mild aortic valve sclerosis is present, with no evidence of aortic valve stenosis. Pulmonic Valve: The pulmonic valve was grossly normal. Pulmonic valve regurgitation is not visualized. Aorta: The aortic root and ascending aorta are structurally normal, with no evidence of dilitation. IAS/Shunts: No atrial level shunt detected by color flow Doppler. Lyman Bishop MD Electronically signed by Lyman Bishop MD Signature Date/Time: 11/17/2020/2:52:04 PM    Final         Scheduled Meds:  albuterol  5 mg/hr Nebulization Once   apixaban  5 mg Oral BID   aspirin EC  81 mg Oral Daily   atorvastatin  80 mg Oral Daily   doxycycline  100 mg Oral Q12H   fluticasone  1 spray Each Nare Daily   folic acid  1 mg Oral Daily   insulin aspart  0-15 Units Subcutaneous Q4H   ipratropium-albuterol  3 mL Nebulization TID   loratadine  10 mg Oral Daily   mometasone-formoterol  2 puff Inhalation BID   predniSONE  40 mg Oral Q breakfast   sodium chloride flush  3 mL Intravenous Q12H   sodium chloride flush   3 mL Intravenous Q12H   sodium chloride flush  3 mL Intravenous Q12H   spironolactone  25 mg Oral Daily   thiamine  100 mg Oral Daily   torsemide  20 mg Oral Daily   Continuous Infusions:  sodium chloride     sodium chloride       LOS: 3 days   Time spent: 18mns, case discussed with cardiology and pharmacy Greater than 50% of this time was spent in counseling, explanation of diagnosis, planning of further management, and coordination of care.  Voice Recognition Viviann Spare dictation system was used to create this note, attempts have been made to correct errors. Please contact the author with questions and/or clarifications.   Florencia Reasons, MD PhD FACP [facility]  Available via Epic secure chat 7am-7pm for nonurgent issues Please page for urgent issues To page the attending provider between 7A-7P or the covering provider during after hours 7P-7A, please log into the web site [email or web address] and access using universal Trail password for that web site. If you do not have the password, please call the hospital operator.    11/17/2020, 5:51 PM

## 2020-11-17 NOTE — Progress Notes (Signed)
Patient refuses CPAP 

## 2020-11-17 NOTE — Significant Event (Signed)
SATURATION QUALIFICATIONS: (This note is used to comply with regulatory documentation for home oxygen)  Patient Saturations on Room Air at Rest = 94%  Patient Saturations on Room Air while Ambulating = 81%  Patient Saturations on 2  Liters of oxygen while Ambulating = 94-97%  Please briefly explain why patient needs home oxygen: Desaturation while walking.

## 2020-11-17 NOTE — Progress Notes (Signed)
Patient in no distress at this time. BIPAP not needed.

## 2020-11-17 NOTE — Progress Notes (Signed)
Cardiology Progress Note  Patient ID: Scott Jensen MRN: [id number] DOB: [birth year] Date of Encounter: 11/17/2020  Primary Cardiologist: None  Subjective   Chief Complaint: None.   HPI: Remains in atrial flutter.  Started on Eliquis last night.  Plans for TEE/cardioversion.  Euvolemic today.  Plans for transition to oral diuretic.  Anticipate discharge after TEE/cardioversion.  ROS:  All other ROS reviewed and negative. Pertinent positives noted in the HPI.     Inpatient Medications  Scheduled Meds:  albuterol  5 mg/hr Nebulization Once   apixaban  5 mg Oral BID   aspirin EC  81 mg Oral Daily   atorvastatin  80 mg Oral Daily   doxycycline  100 mg Oral Q12H   fluticasone  1 spray Each Nare Daily   folic acid  1 mg Oral Daily   insulin aspart  0-15 Units Subcutaneous Q4H   ipratropium-albuterol  3 mL Nebulization TID   loratadine  10 mg Oral Daily   mometasone-formoterol  2 puff Inhalation BID   predniSONE  40 mg Oral Q breakfast   sodium chloride flush  3 mL Intravenous Q12H   sodium chloride flush  3 mL Intravenous Q12H   sodium chloride flush  3 mL Intravenous Q12H   spironolactone  25 mg Oral Daily   thiamine  100 mg Oral Daily   torsemide  20 mg Oral Daily   Continuous Infusions:  sodium chloride     sodium chloride 20 mL/hr at 11/16/20 2054   sodium chloride     diltiazem (CARDIZEM) infusion 10 mg/hr (11/16/20 2053)   PRN Meds: sodium chloride, sodium chloride, acetaminophen, albuterol, diazepam, fentaNYL (SUBLIMAZE) injection, guaiFENesin, nitroGLYCERIN, ondansetron (ZOFRAN) IV, sodium chloride flush, sodium chloride flush, traMADol   Vital Signs   Vitals:   11/16/20 2008 11/16/20 2122 11/17/20 0409 11/17/20 0840  BP: 132/78  119/67   Pulse: 84 84 83   Resp: '20 20 20   '$ Temp: 98.4 F (36.9 C)  98.6 F (37 C)   TempSrc: Oral  Oral   SpO2: 96% 97% 95% 96%  Weight:   111 kg   Height:        Intake/Output Summary (Last 24 hours) at 11/17/2020 0904 Last  data filed at 11/17/2020 0716 Gross per 24 hour  Intake 578 ml  Output 3900 ml  Net -3322 ml   Last 3 Weights 11/17/2020 11/16/2020 11/15/2020  Weight (lbs) 244 lb 11.2 oz 248 lb 3.2 oz 249 lb 4.8 oz  Weight (kg) 110.995 kg 112.583 kg 113.082 kg      Telemetry  Overnight telemetry shows atrial flutter heart rate in the 80s, which I personally reviewed.    Physical Exam   Vitals:   11/16/20 2008 11/16/20 2122 11/17/20 0409 11/17/20 0840  BP: 132/78  119/67   Pulse: 84 84 83   Resp: '20 20 20   '$ Temp: 98.4 F (36.9 C)  98.6 F (37 C)   TempSrc: Oral  Oral   SpO2: 96% 97% 95% 96%  Weight:   111 kg   Height:        Intake/Output Summary (Last 24 hours) at 11/17/2020 0904 Last data filed at 11/17/2020 0716 Gross per 24 hour  Intake 578 ml  Output 3900 ml  Net -3322 ml    Last 3 Weights 11/17/2020 11/16/2020 11/15/2020  Weight (lbs) 244 lb 11.2 oz 248 lb 3.2 oz 249 lb 4.8 oz  Weight (kg) 110.995 kg 112.583 kg 113.082 kg    Body mass index  is 37.21 kg/m.  General: Well nourished, well developed, in no acute distress Head: Atraumatic, normal size  Eyes: PEERLA, EOMI  Neck: Supple, JVD 5 to 7 cm water Endocrine: No thryomegaly Cardiac: Irregular rhythm, no murmurs Lungs: Rhonchi bilaterally Abd: Soft, nontender, no hepatomegaly  Ext: Trace edema, right radial cath site clean and dry, good pulse Musculoskeletal: No deformities, BUE and BLE strength normal and equal Skin: Warm and dry, no rashes   Neuro: Alert and oriented to person, place, time, and situation, CNII-XII grossly intact, no focal deficits  Psych: Normal mood and affect   Labs  High Sensitivity Troponin:   Recent Labs  Lab 11/13/20 1105 11/13/20 1348 11/14/20 1959 11/14/20 2201 11/14/20 2329  TROPONINIHS 18* 15 15 45* 65*     Cardiac EnzymesNo results for input(s): TROPONINI in the last 168 hours. No results for input(s): TROPIPOC in the last 168 hours.  Chemistry Recent Labs  Lab 11/13/20 2313  11/14/20 0209 11/16/20 0834 11/16/20 0842 11/16/20 1011 11/17/20 0306  NA 132* 133*   < > 136 135 135  K 4.7 4.7   < > 4.5 4.5 4.6  CL 95* 94*  --   --  93* 92*  CO2 30 32  --   --  35* 35*  GLUCOSE 230* 196*  --   --  171* 145*  BUN 7* 6*  --   --  27* 24*  CREATININE 0.69 0.59*  --   --  0.89 0.79  CALCIUM 9.1 9.3  --   --  9.0 9.1  PROT 7.4 7.1  --   --   --   --   ALBUMIN 4.0 3.8  --   --   --   --   AST 19 16  --   --   --   --   ALT 22 20  --   --   --   --   ALKPHOS 63 64  --   --   --   --   BILITOT 1.3* 1.2  --   --   --   --   GFRNONAA >60 >60  --   --  >60 >60  ANIONGAP 7 7  --   --  7 8   < > = values in this interval not displayed.    Hematology Recent Labs  Lab 11/14/20 0209 11/15/20 1920 11/16/20 0842 11/16/20 1011 11/17/20 0306  WBC 12.8*  --   --  10.8* 7.2  RBC 6.07*  --   --  5.98* 6.16*  HGB 18.4*   < > 20.1* 18.2* 18.6*  HCT 57.5*   < > 59.0* 56.9* 57.8*  MCV 94.7  --   --  95.2 93.8  MCH 30.3  --   --  30.4 30.2  MCHC 32.0  --   --  32.0 32.2  RDW 14.6  --   --  14.7 14.5  PLT 152  --   --  156 153   < > = values in this interval not displayed.   BNP Recent Labs  Lab 11/13/20 1105  BNP 136.4*    DDimer No results for input(s): DDIMER in the last 168 hours.   Radiology  CARDIAC CATHETERIZATION  Result Date: 11/16/2020 Formatting of this result is different from the original.   Mid LM to [place] LAD lesion is 30% stenosed.   Ost Cx lesion is 30% stenosed.   Ost LAD lesion is 30% stenosed.   Mid LAD  lesion is 40% stenosed.   Mid Cx lesion is 30% stenosed.   2nd Mrg lesion is 30% stenosed.   Prox RCA lesion is 30% stenosed.   The left ventricular systolic function is normal.   The left ventricular ejection fraction is 55-65% by visual estimate. 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  There is 40% stenosis in the LAD between the first and second diagonal vessel.  Circumflex vessel  has 30% stenosis in the OM vessel and AV groove after the OM1 takeoff.  The RCA is a dominant vessel with 30% smooth proximal to mid narrowing. Normal to hyperdynamic LV function with EF estimate at 60 to 65%.  There is evidence suggesting moderate concentric left ventricular hypertrophy.  There were no focal segmental wall motion abnormalities. Elevated right heart pressures with moderately-severe pulmonary arterial hypertension with PA pressure 74/30, mean 43. Increased PVR at 6.7 WU. RECOMMENDATION: The patient was in atrial flutter throughout the procedure.  Heparin will be resumed 10 hours post procedure.  Initiate aggressive lipid-lowering therapy.  Medical therapy for CAD.  Plan for restoration of sinus rhythm.  Consider pulmonary evaluation for pulmonary arterial hypertension   Cardiac Studies  TTE 11/14/2020  1. Left ventricular ejection fraction, by estimation, is 65 to 70%. The  left ventricle has normal function. The left ventricle has no regional  wall motion abnormalities. There is moderate concentric left ventricular  hypertrophy. Left ventricular  diastolic parameters are consistent with Grade I diastolic dysfunction  (impaired relaxation). There is the interventricular septum is flattened  in systole, consistent with right ventricular pressure overload.   2. Right ventricular systolic function is mildly reduced. The right  ventricular size is mildly enlarged. Mildly increased right ventricular  wall thickness.   3. Left atrial size was mild to moderately dilated.   4. Right atrial size was moderately dilated.   5. The mitral valve is normal in structure. No evidence of mitral valve  regurgitation.   6. The aortic valve is tricuspid. There is mild calcification of the  aortic valve. Aortic valve regurgitation is not visualized. Mild aortic  valve sclerosis is present, with no evidence of aortic valve stenosis.   7. The inferior vena cava is dilated in size with <50% respiratory   variability, suggesting right atrial pressure of 15 mmHg.   RHC/LHC 11/16/2020    Mid LM to Ost LAD lesion is 30% stenosed.   Ost Cx lesion is 30% stenosed.   Ost LAD lesion is 30% stenosed.   Mid LAD lesion is 40% stenosed.   Mid Cx lesion is 30% stenosed.   2nd Mrg lesion is 30% stenosed.   Prox RCA lesion is 30% stenosed.   The left ventricular systolic function is normal.   The left ventricular ejection fraction is 55-65% by visual estimate.   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  There is 40% stenosis in the LAD between the first and second diagonal vessel.  Circumflex vessel has 30% stenosis in the OM vessel and AV groove after the OM1 takeoff.  The RCA is a dominant vessel with 30% smooth proximal to mid narrowing.   Normal to hyperdynamic LV function with EF estimate at 60 to 65%.  There is evidence suggesting moderate concentric left ventricular hypertrophy.  There were no focal segmental wall motion abnormalities.   Elevated right heart pressures with moderately-severe pulmonary arterial hypertension with PA  pressure 74/30, mean 43.   Increased PVR at 6.7 WU.   RECOMMENDATION: The patient was in atrial flutter throughout the procedure.  Heparin will be resumed 10 hours post procedure.  Initiate aggressive lipid-lowering therapy.  Medical therapy for CAD.  Plan for restoration of sinus rhythm.  Consider pulmonary evaluation for pulmonary arterial hypertension  Patient Profile  65 year old male with history of hypertension, hyperlipidemia, diabetes (A1c 7.4, on no medications at home), COPD, prior tobacco abuse, OSA, morbid obesity who was admitted on 11/13/2020 with acute chest pain and found to have acute on chronic hypercapnic/hypoxic respiratory failure secondary to COPD exacerbation and likely diastolic heart failure.  Assessment & Plan   Chest pain/heart failure preserved ejection fraction/moderate  pulmonary hypertension/mixed group 2/group 3 pulmonary hypertension -Admitted with chest pain.  Found to be in atrial flutter.  Has known COPD. -Left heart cath with nonobstructive CAD. -Found to have moderate pulmonary hypertension -Suspect symptoms are from pulmonary hypertension and RV failure -Appears to be mixed group 2 and group 3.  He did have an elevated wedge pressure however he has a transpulmonary gradient at proportion to his left-sided heart failure.  I suspect COPD is contributing. -Recommend aggressive treatment of his lung disease.  He needs OSA corrected as well. -Appears euvolemic.  Good diuresis overnight.  Net -3 L.  Transition to torsemide today.  Torsemide 20 mg daily should be okay -Would recommend to continue Aldactone 25 mg daily at discharge.  This will help. -TEE/cardioversion today for atrial flutter.  Suspect this right atrial arrhythmia has been driven by his right heart failure.  2.  Atrial flutter -Driven by right heart failure.  Transition to Eliquis last night with 10 mg loading dose.  He will receive 5 mg this morning. -N.p.o. for TEE/cardioversion. -We will need to continue Eliquis at discharge. -Remains on diltiazem drip.  This will need to be stopped after his TEE/cardioversion.  Likely will not need any beta-blocker or calcium channel blocker at discharge.  I would avoid beta-blocker given significant lung disease.  3.  Nonobstructive CAD -Continue aspirin and statin therapy.  LDL 82.  Would continue Lipitor 80 mg daily.  Needs aggressive LDL reduction.  4.  Hypertension -We will continue Aldactone at discharge.  This will help with his blood pressure and volume.  For questions or updates, please contact [place] Please consult [email or web address] for contact info under   Time Spent with Patient: I have spent a total of 35 minutes with patient reviewing hospital notes, telemetry, EKGs, labs and examining the patient as well as establishing an  assessment and plan that was discussed with the patient.  > 50% of time was spent in direct patient care.    Signed, Addison Naegeli. Audie Box, MD, [place]  11/17/2020 9:04 AM

## 2020-11-17 NOTE — Progress Notes (Signed)
  Echocardiogram Echocardiogram Transesophageal has been performed.  Bobbye Charleston 11/17/2020, 12:33 PM

## 2020-11-17 NOTE — Transfer of Care (Signed)
Immediate Anesthesia Transfer of Care Note  Patient: Scott Jensen  Procedure(s) Performed: TRANSESOPHAGEAL ECHOCARDIOGRAM (TEE) CARDIOVERSION  Patient Location: Endoscopy Unit  Anesthesia Type:General  Level of Consciousness: awake, alert  and oriented  Airway & Oxygen Therapy: Patient Spontanous Breathing and Patient connected to face mask oxygen  Post-op Assessment: Report given to RN and Post -op Vital signs reviewed and stable  Post vital signs: Reviewed and stable  Last Vitals:  Vitals Value Taken Time  BP 136/67   Temp    Pulse 89 11/17/20 1231  Resp 16 11/17/20 1231  SpO2 99 % 11/17/20 1231  Vitals shown include unvalidated device data.  Last Pain:  Vitals:   11/17/20 1103  TempSrc: Oral  PainSc: 0-No pain      Patients Stated Pain Goal: 0 ([id number])  Complications: No notable events documented.

## 2020-11-17 NOTE — Discharge Instructions (Signed)

## 2020-11-17 NOTE — CV Procedure (Signed)
TEE/CARDIOVERSION NOTE  TRANSESOPHAGEAL ECHOCARDIOGRAM (TEE):  Indictation: Atrial Flutter  Consent:   Informed consent was obtained prior to the procedure. The risks, benefits and alternatives for the procedure were discussed and the patient comprehended these risks.  Risks include, but are not limited to, cough, sore throat, vomiting, nausea, somnolence, esophageal and stomach trauma or perforation, bleeding, low blood pressure, aspiration, pneumonia, infection, trauma to the teeth and death.    Time Out: Verified patient identification, verified procedure, site/side was marked, verified correct patient position, special equipment/implants available, medications/allergies/relevent history reviewed, required imaging and test results available. Performed  Procedure:  After a procedural time-out, the patient was given propofol per anesthesia for moderate sedation. The patient's heart rate, blood pressure, and oxygen saturation are monitored continuously during the procedure. The oropharynx was anesthetized with 2 cetacaine sprays.  The transesophageal probe was inserted in the esophagus and stomach without difficulty and multiple views were obtained. Agitated microbubble saline contrast was not administered.  Complications:    Complications: The patient had some transient hypoxemia due to possible laryngospasm during the procedure, but recovered. The anesthesiologist and anesthetist were present. Patient did tolerate procedure well.  Findings:  LEFT VENTRICLE: The left ventricular wall thickness moderately increased.  The left ventricular cavity is normal in size. Wall motion is normal.  LVEF is 65-70%.  RIGHT VENTRICLE:  The right ventricle is mildly dilated with normal systolic function without any thrombus or masses.    LEFT ATRIUM:  The left atrium is mildly dilated in size without any thrombus or masses.  There is not spontaneous echo contrast ("smoke") in the left atrium  consistent with a low flow state.  LEFT ATRIAL APPENDAGE:  The small left atrial appendage is free of any thrombus or masses. The appendage has single lobes. Pulse doppler indicates moderate flow in the appendage.  ATRIAL SEPTUM:  The atrial septum appears intact and is free of thrombus and/or masses.  There is no evidence for interatrial shunting by color doppler and saline microbubble.  RIGHT ATRIUM:  The right atrium is moderately dilated in size and function without any thrombus or masses.  MITRAL VALVE:  The mitral valve is normal in structure and function with  trivial  regurgitation.  There were no vegetations or stenosis.  AORTIC VALVE:  The aortic valve is trileaflet and sclerotic, with  mild to moderate central  regurgitation.  There were no vegetations or stenosis  TRICUSPID VALVE:  The tricuspid valve is normal in structure and function with Mild regurgitation.  There were no vegetations or stenosis   PULMONIC VALVE:  The pulmonic valve is normal in structure and function with  no  regurgitation.  There were no vegetations or stenosis.   AORTIC ARCH, ASCENDING AND DESCENDING AORTA:  There was grade 1 Ron Parker et. Al, 1992) atherosclerosis of the ascending aorta, aortic arch, or proximal descending aorta.  12. PULMONARY VEINS: Anomalous pulmonary venous return was not noted.  13. PERICARDIUM: The pericardium appeared normal and non-thickened.  There is no pericardial effusion.  CARDIOVERSION:     Second Time Out: Verified patient identification, verified procedure, site/side was marked, verified correct patient position, special equipment/implants available, medications/allergies/relevent history reviewed, required imaging and test results available.  Not performed due to somewhat urgent cardioversion/airway issues.  Procedure:  Patient placed on cardiac monitor, pulse oximetry, supplemental oxygen as necessary.  Sedation administered per anesthesia Pacer pads placed anterior  and posterior chest. Cardioverted 1 time(s).  Cardioverted at 120J biphasic.  Complications:  Complications: None  Patient did tolerate procedure well.  Impression:  No LAA thrombus Negative for PFO by color doppler Mild to moderate central AI, aortic valve sclerosis Mild TR Mildly dilated RV Moderate RAE, mild LAE LVEF 65-70% Successful DCCV with a single 120J biphasic shock to NSR.  Recommendations:  Further management per inpatient cardiology service.  Time Spent Directly with the Patient:  60 minutes   Pixie Casino, MD, [facility], [place] Director of the [facility]  Card[facility] Diplomate of the American Board of Clinical Lipidology Attending Cardiologist  Direct Dial: [phone]  Fax: [phone]  Website:  [email or web address] Osgood Peta Peachey 11/17/2020, 12:26 PM

## 2020-11-17 NOTE — Anesthesia Postprocedure Evaluation (Signed)
Anesthesia Post Note  Patient: Producer, television/film/video  Procedure(s) Performed: TRANSESOPHAGEAL ECHOCARDIOGRAM (TEE) CARDIOVERSION     Patient location during evaluation: Endoscopy Anesthesia Type: General Level of consciousness: awake Pain management: pain level controlled Vital Signs Assessment: post-procedure vital signs reviewed and stable Respiratory status: spontaneous breathing Cardiovascular status: stable Postop Assessment: no apparent nausea or vomiting Anesthetic complications: no   No notable events documented.  Last Vitals:  Vitals:   11/17/20 1300 11/17/20 1619  BP:  (!) 134/58  Pulse: 88 88  Resp:  19  Temp:  (!) 36.4 C  SpO2: 99% 96%    Last Pain:  Vitals:   11/17/20 1627  TempSrc:   PainSc: 0-No pain                 Holman Bonsignore

## 2020-11-17 NOTE — Care Management Important Message (Signed)
Important Message  Patient Details  Name: Scott Jensen MRN: [id number] Date of Birth: 31-May-1955   Medicare Important Message Given:  Yes     [name] 11/17/2020, 11:23 AM

## 2020-11-17 NOTE — Anesthesia Procedure Notes (Signed)
Procedure Name: General with mask airway Date/Time: 11/17/2020 12:01 PM Performed by: Dorthea Cove, CRNA Pre-anesthesia Checklist: Patient identified, Emergency Drugs available, Suction available, Patient being monitored and Timeout performed Patient Re-evaluated:Patient Re-evaluated prior to induction Oxygen Delivery Method: Nasal cannula Preoxygenation: Pre-oxygenation with 100% oxygen Induction Type: IV induction Placement Confirmation: positive ETCO2 Dental Injury: Teeth and Oropharynx as per pre-operative assessment

## 2020-11-18 DIAGNOSIS — J441 Chronic obstructive pulmonary disease with (acute) exacerbation: Secondary | ICD-10-CM | POA: Diagnosis not present

## 2020-11-18 DIAGNOSIS — J9601 Acute respiratory failure with hypoxia: Secondary | ICD-10-CM | POA: Diagnosis not present

## 2020-11-18 DIAGNOSIS — K7469 Other cirrhosis of liver: Secondary | ICD-10-CM | POA: Diagnosis not present

## 2020-11-18 DIAGNOSIS — I483 Typical atrial flutter: Secondary | ICD-10-CM | POA: Diagnosis not present

## 2020-11-18 LAB — CBC
HCT: 57.1 % — ABNORMAL HIGH (ref 39.0–52.0)
Hemoglobin: 18 g/dL — ABNORMAL HIGH (ref 13.0–17.0)
MCH: 29.7 pg (ref 26.0–34.0)
MCHC: 31.5 g/dL (ref 30.0–36.0)
MCV: 94.1 fL (ref 80.0–100.0)
Platelets: 151 10*3/uL (ref 150–400)
RBC: 6.07 MIL/uL — ABNORMAL HIGH (ref 4.22–5.81)
RDW: 14.4 % (ref 11.5–15.5)
WBC: 7.8 10*3/uL (ref 4.0–10.5)
nRBC: 0 % (ref 0.0–0.2)

## 2020-11-18 LAB — GLUCOSE, CAPILLARY: Glucose-Capillary: 155 mg/dL — ABNORMAL HIGH (ref 70–99)

## 2020-11-18 NOTE — Progress Notes (Signed)
Received referral to assist pt with home O2. Met with pt. He plans to return home with the support of his son. He reports that he lives with his son. He drives. He doesn't have a preference for a home O2 company. Will contact [employer] for referral and he agrees with the agency. Contacted Jermaine with [employer] for the referral and he accepted it. Provided pt with a $10 copay card for Eliquis. He reports that his prescriptions were filled by [facility] and they used the 30 day free card.

## 2020-11-18 NOTE — Discharge Summary (Signed)
Discharge Summary  [name] [id number] DOB: 02/01/1956  PCP: Mackie Pai, PA-C  Admit date: 11/13/2020 Discharge date: 11/18/2020  Time spent: 49mns, more than 50% time spent on coordination of care.  Recommendations for Outpatient Follow-up:  F/u with PCP within a week  for hospital discharge follow up, repeat cbc/bmp at follow up F/u with cardiology Home oxygen ordered Encourage outpatient sleep study    Discharge Diagnoses:  Active Hospital Problems   Diagnosis Date Noted   Atrial flutter ([facility]    Acute respiratory failure with hypoxia (HCC) 11/13/2020   Chronic diastolic CHF (congestive heart failure) ([facility] 11/13/2020   Other cirrhosis of liver ([facility] 11/13/2020   Chest pain 12/07/2016   COPD with acute exacerbation ([facility] 12/06/2016   Hyperlipidemia 11/05/2016   Hypertensive heart disease without CHF 06/07/2015   Tobacco abuse disorder 05/22/2014    Resolved Hospital Problems  No resolved problems to display.    Discharge Condition: stable  Diet recommendation: heart healthy/carb modified  Filed Weights   11/16/20 0500 11/17/20 0409 11/17/20 1103  Weight: 112.6 kg 111 kg 111 kg    History of present illness: ( per admitting MD Dr DRoel Cluck SHassan BucklerPHartneris a 65y.o. male with medical history significant of Pulmonary hypertension HLD, HTN, asthma/COPD, diastolic CHF, tobacco abuse     Presented with   CP this AM that radiated to his neck also associated shortness of breath noted to be satting 82% on room air upon arrival. Shortness of breath started yesterday and today became more severe.  Also some central chest pain radiating towards the neck.  Worse with deep inspiration. He has chronic leg edema which has been studied in the past and negative for DVT last year    Has been smoking on off. Now not really Case of beer lasts week Reports he has regular loose stools Has daily headaches after taking his BP and takes tylenol 3 tabs at a time times a  day   Reports wheezing, coughing   Reports chronic hypoxia with O2 running in low 80's at baseline He was supposed to be on oxygen some time ago but did not qualify bc he was borderline at the time and never readdressed it since.   Has   been vaccinated against COVID      Initial COVID TEST NEGATIVE    Recent Labs       Lab Results  Component Value Date    [place] 11[place]13[place]       Hospital Course:  Active Problems:   Tobacco abuse disorder   Hypertensive heart disease without CHF   Hyperlipidemia   COPD with acute exacerbation (HCC)   Chest pain   Acute respiratory failure with hypoxia (HCC)   Chronic diastolic CHF (congestive heart failure) (HCC)   Other cirrhosis of liver (HCC)   Atrial flutter (HCC)  Acute hypoxic respiratory failure -Patient reports he was recommended to use oxygen at home but he declined in the past, likely has component of chronic hypoxic respiratory failure, as evidenced by chronic polycythemia - acute short of breath and hypoxic respiratory failure likely from mild COPD exacerbation, acute on chronic diastolic CHF, A. fib could also contribute to short of breath -he meets home o2 criteria, home o2 ordered,      COPD exacerbation Treated with steroid, DuoNeb, doxycycline Mild scattered wheezing/rhonchi on exam initially, has resolved at discharge Chest x-ray no acute infiltrate   Chest pain, non-STEMI, was on  heparin drip, s/p cardiac cath on 7/21 (Initiate  aggressive lipid-lowering therapy.  Medical therapy for CAD.  Plan for restoration of sinus rhythm.  Consider pulmonary evaluation for pulmonary arterial hypertension ), chest pain resolved, he is cleared to discharge home by cardiology,  ASA, Lipitor New onset A. fib/a flutter, -was on  Cardizem drip, s/p cardioversion, now sinus rhythm,  off heparin drip, started on Eliquis on 7/21 Acute on chronic diastolic CHF, was on IV Lasix then Lasix drip , now on demadex, spironolactone  per cardiology recommendation He is cleared to discharge home by cardiology, he is to follow up with cardiology   Diabetes, uncontrolled, with hyperglycemia Does not appear to be on medication at home A1c 7.4% Per cardiology note plan to start SGLT2 inhibitor , will defer to cardiology. He is going to see cardiology in two weeks He received insulin sliding scale while on short course of steroid in the hospital, am blood glucose 145, he is follow up with pcp   Hypertension Appears on telmisartan and Lasix at home Now on Currently on demadex and spironolactone per cardiology recommendation  Cirrhosis, incidental finding on Ct obtained from ED  lft wnl Hepatitis panel negative Outpatient follow up    Body mass index is 37.21 kg/m.Marland Kitchen Reported history of sleep apnea, he report not ready for CPAP, he  prefers to home oxygen first                  Consultants:  Cardiology Pulmonary   Procedures:  cardiac cath on 7/21 Cardioversion on 7/22   Antimicrobials:    Anti-infectives (From admission, onward)    Start     Dose/Rate Route Frequency Ordered Stop   11/14/20 0030  doxycycline (VIBRA-TABS) tablet 100 mg        100 mg Oral Every 12 hours 11/13/20 2332 11/18/20 0816       Discharge Exam: BP (!) 151/64   Pulse 91   Temp 97.6 F (36.4 C) (Oral)   Resp 20   Ht '5\' 8"'$  (1.727 m)   Wt 111 kg   SpO2 98%   BMI 37.21 kg/m   General: NAD, bilateral lower extremity edema has resolved Cardiovascular: RRR Respiratory: rhonchi/wheezing heard earlier has resolved, now CTABL  Discharge Instructions You were cared for by a hospitalist during your hospital stay. If you have any questions about your discharge medications or the care you received while you were in the hospital after you are discharged, you can call the unit and asked to speak with the hospitalist on call if the hospitalist that took care of you is not available. Once you are discharged, your primary care physician will  handle any further medical issues. Please note that NO REFILLS for any discharge medications will be authorized once you are discharged, as it is imperative that you return to your primary care physician (or establish a relationship with a primary care physician if you do not have one) for your aftercare needs so that they can reassess your need for medications and monitor your lab values.  Discharge Instructions     Diet - low sodium heart healthy   Complete by: As directed    Carb modified diet   Increase activity slowly   Complete by: As directed       Allergies as of 11/18/2020   No Known Allergies      Medication List     STOP taking these medications    CoQ10 100 MG Caps   furosemide 20 MG tablet Commonly known as: LASIX  telmisartan 80 MG tablet Commonly known as: MICARDIS       TAKE these medications    albuterol 108 (90 Base) MCG/ACT inhaler Commonly known as: Ventolin HFA Inhale 1-2 puffs into the lungs every 6 (six) hours as needed for wheezing or shortness of breath.   Aspirin Low Dose 81 MG EC tablet Generic drug: aspirin Take 1 tablet (81 mg total) by mouth daily. Swallow whole.   atorvastatin 80 MG tablet Commonly known as: LIPITOR Take 1 tablet (80 mg total) by mouth daily.   Eliquis 5 MG Tabs tablet Generic drug: apixaban Take 1 tablet (5 mg total) by mouth 2 (two) times daily.   Fluticasone-Salmeterol 250-50 MCG/DOSE Aepb Commonly known as: Advair Diskus Inhale 1 puff into the lungs 2 (two) times daily. What changed:  when to take this reasons to take this   spironolactone 25 MG tablet Commonly known as: ALDACTONE Take 1 tablet (25 mg total) by mouth daily.   torsemide 20 MG tablet Commonly known as: DEMADEX Take 1 tablet (20 mg total) by mouth daily.               Durable Medical Equipment  (From admission, onward)           Start     Ordered   11/17/20 1440  For home use only DME oxygen  Once       Question Answer  Comment  Length of Need Lifetime   Mode or (Route) Nasal cannula   Liters per Minute 2   Oxygen conserving device Yes   Oxygen delivery system Gas      11/17/20 1440           No Known Allergies  Follow-up Information     Saguier, Percell Miller, PA-C Follow up.   Specialties: Internal Medicine, Family Medicine Contact information: [address] [place] [address] [phone]         Almyra Deforest, [place] Follow up on 12/08/2020.   Specialties: Cardiology, Radiology Why: 12:15PM. Cardiology follow up Contact information: [address] [place] [address] [phone]                  The results of significant diagnostics from this hospitalization (including imaging, microbiology, ancillary and laboratory) are listed below for reference.    Significant Diagnostic Studies: CARDIAC CATHETERIZATION  Result Date: 11/16/2020 Formatting of this result is different from the original.   Mid LM to [place] LAD lesion is 30% stenosed.   Ost Cx lesion is 30% stenosed.   Ost LAD lesion is 30% stenosed.   Mid LAD lesion is 40% stenosed.   Mid Cx lesion is 30% stenosed.   2nd Mrg lesion is 30% stenosed.   Prox RCA lesion is 30% stenosed.   The left ventricular systolic function is normal.   The left ventricular ejection fraction is 55-65% by visual estimate. 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  There is 40% stenosis in the LAD between the first and second diagonal vessel.  Circumflex vessel has 30% stenosis in the OM vessel and AV groove after the OM1 takeoff.  The RCA is a dominant vessel with 30% smooth proximal to mid narrowing. Normal to hyperdynamic LV function with EF estimate at 60 to 65%.  There is evidence suggesting moderate concentric left ventricular hypertrophy.  There were no focal segmental wall motion abnormalities. Elevated right heart pressures with moderately-severe pulmonary  arterial  hypertension with PA pressure 74/30, mean 43. Increased PVR at 6.7 WU. RECOMMENDATION: The patient was in atrial flutter throughout the procedure.  Heparin will be resumed 10 hours post procedure.  Initiate aggressive lipid-lowering therapy.  Medical therapy for CAD.  Plan for restoration of sinus rhythm.  Consider pulmonary evaluation for pulmonary arterial hypertension  DG Chest Portable 1 View  Result Date: 11/13/2020 CLINICAL DATA:  65 year old male woke with chest pain radiating to the neck. Hypoxia on room air. EXAM: PORTABLE CHEST 1 VIEW COMPARISON:  Chest radiograph 11/06/2018 and earlier. FINDINGS: Portable AP semi upright view at 1104 hours. Stable mild cardiomegaly and mediastinal contours. Visualized tracheal air column is within normal limits. Mildly lower lung volumes. Allowing for portable technique the lungs are clear. No pneumothorax. Chronic cervical ACDF. No acute osseous abnormality identified. IMPRESSION: No acute cardiopulmonary abnormality. Electronically Signed   By: Genevie Ann M.D.   On: 11/13/2020 11:43   ECHOCARDIOGRAM COMPLETE  Result Date: 11/14/2020    ECHOCARDIOGRAM REPORT   Patient Name:   TURNER Brosious Date of Exam: 11/14/2020 Medical Rec #:  [id number]      Height:       68.0 in Accession #:    [id number]     Weight:       254.6 lb Date of Birth:  [birth year]       BSA:          2.265 m Patient Age:    89 years       BP:           131/72 mmHg Patient Gender: M              HR:           92 bpm. Exam Location:  Inpatient Procedure: 2D Echo, Cardiac Doppler, Color Doppler and Intracardiac            Opacification Agent Indications:    CHF - Acute Disastolic  History:        Patient has prior history of Echocardiogram examinations, most                 recent 01/18/2020. CAD, COPD and Pulmonary HTN,                 Arrythmias:Tachycardia, Signs/Symptoms:Shortness of Breath; Risk                 Factors:Hypertension, Sleep Apnea, Former Smoker and                 Dyslipidemia.  Cardiomegaly.  Sonographer:    Clayton Lefort RDCS (AE) Referring Phys: [id number] ANASTASSIA DOUTOVA  Sonographer Comments: Technically challenging study due to limited acoustic windows, Technically difficult study due to poor echo windows, suboptimal parasternal window, suboptimal apical window and patient is morbidly obese. Image acquisition challenging due to patient body habitus and Image acquisition challenging due to COPD. IMPRESSIONS  1. Left ventricular ejection fraction, by estimation, is 65 to 70%. The left ventricle has normal function. The left ventricle has no regional wall motion abnormalities. There is moderate concentric left ventricular hypertrophy. Left ventricular diastolic parameters are consistent with Grade I diastolic dysfunction (impaired relaxation). There is the interventricular septum is flattened in systole, consistent with right ventricular pressure overload.  2. Right ventricular systolic function is mildly reduced. The right ventricular size is mildly enlarged. Mildly increased right ventricular wall thickness.  3. Left atrial size was mild to moderately dilated.  4. Right atrial size was moderately dilated.  5.  The mitral valve is normal in structure. No evidence of mitral valve regurgitation.  6. The aortic valve is tricuspid. There is mild calcification of the aortic valve. Aortic valve regurgitation is not visualized. Mild aortic valve sclerosis is present, with no evidence of aortic valve stenosis.  7. The inferior vena cava is dilated in size with <50% respiratory variability, suggesting right atrial pressure of 15 mmHg. Comparison(s): No significant change from prior study. Prior images reviewed side by side. Although the PA pressure could not be estimated due to the absence of tricuspid insufficincy, 2D findings suggest chronic severe pulmonary artery hypertension. FINDINGS  Left Ventricle: Left ventricular ejection fraction, by estimation, is 65 to 70%. The left ventricle has normal  function. The left ventricle has no regional wall motion abnormalities. Definity contrast agent was given IV to delineate the left ventricular  endocardial borders. The left ventricular internal cavity size was normal in size. There is moderate concentric left ventricular hypertrophy. The interventricular septum is flattened in systole, consistent with right ventricular pressure overload. Left ventricular diastolic parameters are consistent with Grade I diastolic dysfunction (impaired relaxation). Indeterminate filling pressures. Right Ventricle: The right ventricular size is mildly enlarged. Mildly increased right ventricular wall thickness. Right ventricular systolic function is mildly reduced. Left Atrium: Left atrial size was mild to moderately dilated. Right Atrium: Right atrial size was moderately dilated. Pericardium: There is no evidence of pericardial effusion. Mitral Valve: The mitral valve is normal in structure. No evidence of mitral valve regurgitation. MV peak gradient, 4.8 mmHg. The mean mitral valve gradient is 3.0 mmHg. Tricuspid Valve: The tricuspid valve is normal in structure. Tricuspid valve regurgitation is not demonstrated. Aortic Valve: The aortic valve is tricuspid. There is mild calcification of the aortic valve. Aortic valve regurgitation is not visualized. Mild aortic valve sclerosis is present, with no evidence of aortic valve stenosis. Aortic valve mean gradient measures 3.0 mmHg. Aortic valve peak gradient measures 5.9 mmHg. Aortic valve area, by VTI measures 4.48 cm. Pulmonic Valve: The pulmonic valve was grossly normal. Pulmonic valve regurgitation is not visualized. No evidence of pulmonic stenosis. Aorta: The aortic root is normal in size and structure. Venous: The inferior vena cava is dilated in size with less than 50% respiratory variability, suggesting right atrial pressure of 15 mmHg. IAS/Shunts: No atrial level shunt detected by color flow Doppler.  LEFT VENTRICLE PLAX 2D  LVIDd:         4.90 cm  Diastology LVIDs:         3.00 cm  LV e' medial:    6.64 cm/s LV PW:         1.80 cm  LV E/e' medial:  12.4 LV IVS:        1.60 cm  LV e' lateral:   7.18 cm/s LVOT diam:     2.20 cm  LV E/e' lateral: 11.5 LV SV:         83 LV SV Index:   37 LVOT Area:     3.80 cm  RIGHT VENTRICLE             IVC RV Basal diam:  4.00 cm     IVC diam: 2.70 cm RV Mid diam:    4.40 cm RV S prime:     12.60 cm/s TAPSE (M-mode): 2.1 cm LEFT ATRIUM             Index       RIGHT ATRIUM  Index LA diam:        4.30 cm 1.90 cm/m  RA Area:     25.50 cm LA Vol (A2C):   65.6 ml 28.97 ml/m RA Volume:   91.60 ml  40.45 ml/m LA Vol (A4C):   63.3 ml 27.95 ml/m LA Biplane Vol: 66.7 ml 29.45 ml/m  AORTIC VALVE AV Area (Vmax):    4.71 cm AV Area (Vmean):   4.27 cm AV Area (VTI):     4.48 cm AV Vmax:           121.00 cm/s AV Vmean:          82.700 cm/s AV VTI:            0.185 m AV Peak Grad:      5.9 mmHg AV Mean Grad:      3.0 mmHg LVOT Vmax:         150.00 cm/s LVOT Vmean:        93.000 cm/s LVOT VTI:          0.218 m LVOT/AV VTI ratio: 1.18  AORTA Ao Root diam: 3.30 cm MITRAL VALVE MV Area (PHT): 3.83 cm     SHUNTS MV Area VTI:   2.67 cm     Systemic VTI:  0.22 m MV Peak grad:  4.8 mmHg     Systemic Diam: 2.20 cm MV Mean grad:  3.0 mmHg MV Vmax:       1.09 m/s MV Vmean:      84.7 cm/s MV Decel Time: 198 msec MV E velocity: 82.50 cm/s MV A velocity: 108.00 cm/s MV E/A ratio:  0.76 Mihai Croitoru MD Electronically signed by Sanda Klein MD Signature Date/Time: 11/14/2020/9:53:49 AM    Final    ECHO TEE  Result Date: 11/17/2020    TRANSESOPHOGEAL ECHO REPORT   Patient Name:   Scott Jensen Date of Exam: 11/17/2020 Medical Rec #:  [id number]      Height:       68.0 in Accession #:    [id number]     Weight:       244.7 lb Date of Birth:  01/09/1956       BSA:          2.227 m Patient Age:    65 years       BP:           146/79 mmHg Patient Gender: M              HR:           81 bpm. Exam Location:   Inpatient Procedure: Transesophageal Echo, Cardiac Doppler and Color Doppler Indications:     I48.92* Unspecified atrial flutter  History:         Patient has prior history of Echocardiogram examinations, most                  recent 11/14/2020. Cardiomegaly and CHF, CAD, Abnormal ECG,                  COPD, Arrythmias:Atrial Flutter, Signs/Symptoms:Chest Pain and                  Dyspnea; Risk Factors:Dyslipidemia, Current Smoker and                  Hypertension.  Sonographer:     Roseanna Rainbow RDCS Referring Phys:  [id number] Darreld Mclean Diagnosing Phys: Lyman Bishop MD PROCEDURE: After discussion of the risks and benefits of a  TEE, an informed consent was obtained from the patient. The transesophogeal probe was passed without difficulty through the esophogus of the patient. Imaged were obtained with the patient in a supine position. Sedation performed by different physician. The patient was monitored while under deep sedation. Anesthestetic sedation was provided intravenously by Anesthesiology: 196.'5mg'$  of Propofol, '100mg'$  of Lidocaine. The patient developed Respiratory depression during the procedure. A successful direct current cardioversion was performed at 120 joules with 1 attempt. IMPRESSIONS  1. Left ventricular ejection fraction, by estimation, is 65 to 70%. The left ventricle has normal function. There is moderate left ventricular hypertrophy.  2. Right ventricular systolic function is normal. The right ventricular size is mildly enlarged.  3. Left atrial size was moderately dilated. No left atrial/left atrial appendage thrombus was detected.  4. Right atrial size was mildly dilated.  5. The mitral valve is grossly normal. Trivial mitral valve regurgitation.  6. The aortic valve is tricuspid. Aortic valve regurgitation is mild to moderate. Mild aortic valve sclerosis is present, with no evidence of aortic valve stenosis. Conclusion(s)/Recommendation(s): No LA/LAA thrombus identified. Successful  cardioversion performed with restoration of normal sinus rhythm. FINDINGS  Left Ventricle: Left ventricular ejection fraction, by estimation, is 65 to 70%. The left ventricle has normal function. The left ventricular internal cavity size was normal in size. There is moderate left ventricular hypertrophy. Right Ventricle: The right ventricular size is mildly enlarged. No increase in right ventricular wall thickness. Right ventricular systolic function is normal. Left Atrium: Left atrial size was moderately dilated. No left atrial/left atrial appendage thrombus was detected. Right Atrium: Right atrial size was mildly dilated. Pericardium: There is no evidence of pericardial effusion. Mitral Valve: The mitral valve is grossly normal. Trivial mitral valve regurgitation. Tricuspid Valve: The tricuspid valve is grossly normal. Tricuspid valve regurgitation is mild. Aortic Valve: The aortic valve is tricuspid. Aortic valve regurgitation is mild to moderate. Mild aortic valve sclerosis is present, with no evidence of aortic valve stenosis. Pulmonic Valve: The pulmonic valve was grossly normal. Pulmonic valve regurgitation is not visualized. Aorta: The aortic root and ascending aorta are structurally normal, with no evidence of dilitation. IAS/Shunts: No atrial level shunt detected by color flow Doppler. Lyman Bishop MD Electronically signed by Lyman Bishop MD Signature Date/Time: 11/17/2020/2:52:04 PM    Final    CT Angio Chest/Abd/Pel for Dissection W and/or Wo Contrast  Result Date: 11/13/2020 CLINICAL DATA:  Chest pain radiating to the neck. Suspect aortic dissection. EXAM: CT ANGIOGRAPHY CHEST, ABDOMEN AND PELVIS TECHNIQUE: Non-contrast CT of the chest was initially obtained. Multidetector CT imaging through the chest, abdomen and pelvis was performed using the standard protocol during bolus administration of intravenous contrast. Multiplanar reconstructed images and MIPs were obtained and reviewed to evaluate the  vascular anatomy. CONTRAST:  132m OMNIPAQUE IOHEXOL 350 MG/ML SOLN COMPARISON:  CT angiography chest 07/23/2018 FINDINGS: CTA CHEST FINDINGS Cardiovascular: Heart size is within normal limits. Extensive coronary calcifications. No aortic intramural hematoma identified on noncontrast images. Thoracic aorta is normal in caliber. No significant stenosis. Mediastinum/Nodes: Nonenlarged lymph nodes seen throughout the mediastinum. No enlarged axillary or hilar lymph nodes. Lungs/Pleura: Lungs are clear. No pleural effusion or pneumothorax. Musculoskeletal: No chest wall abnormality. No acute or significant osseous findings. Review of the MIP images confirms the above findings. CTA ABDOMEN AND PELVIS FINDINGS VASCULAR Aorta: Scattered atheromatous plaque without aneurysmal dilatation or significant stenosis. Celiac: Patent without evidence of aneurysm, dissection, vasculitis or significant stenosis. SMA: Patent without evidence of aneurysm, dissection, vasculitis or  significant stenosis. Renals: Both renal arteries are patent without evidence of aneurysm, dissection, vasculitis, fibromuscular dysplasia or significant stenosis. IMA: Patent. Inflow: Scattered calcified atheromatous plaque of the common, internal common external iliac arteries without high-grade stenosis. 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 The right profunda femoris artery originates above the level of the hip. No significant abnormality of the visualized left outflow arteries. Veins: No obvious venous abnormality within the limitations of this arterial phase study. Review of the MIP images confirms the above findings. NON-VASCULAR Hepatobiliary: Postsurgical changes of cholecystectomy are seen. Hepatic contours appear nodular, suspicious for cirrhosis. No significant biliary dilatation. Pancreas: Unremarkable. No pancreatic ductal dilatation or surrounding inflammatory  changes. Spleen: Normal in size without focal abnormality. Adrenals/Urinary Tract: Adrenal glands are normal. 1.9 cm simple cyst present in the lower pole of the right kidney. Kidneys, ureters, and bladder otherwise unremarkable. Stomach/Bowel: No bowel dilatation to indicate ileus or obstruction. Appendix is normal. Extensive diverticulosis of the descending and sigmoid colon. Lymphatic: No enlarged abdominal or pelvic lymph nodes. Multiple nonenlarged lymph nodes are seen in the retroperitoneum. Insert most likely reactive. Reproductive: Mildly enlarged prostate. Other: No abdominal wall hernia or abnormality. No abdominopelvic ascites. Musculoskeletal: No acute or significant osseous findings. Review of the MIP images confirms the above findings. IMPRESSION: 1. No acute abnormality of the chest, abdomen, or pelvis. No aortic dissection. 2. 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 3. Nodular hepatic contours are suspicious for cirrhosis. 4. Distal colonic diverticulosis without evidence of acute diverticulitis. Electronically Signed   By: Miachel Roux M.D.   On: 11/13/2020 13:50    Microbiology: Recent Results (from the past 240 hour(s))  Resp Panel by RT-PCR (Flu A&B, Covid) Nasopharyngeal Swab     Status: None   Collection Time: 11/13/20 11:05 AM   Specimen: Nasopharyngeal Swab; Nasopharyngeal(NP) swabs in vial transport medium  Result Value Ref Range Status   SARS Coronavirus 2 by RT PCR NEGATIVE NEGATIVE Final    Comment: (NOTE) SARS-CoV-2 target nucleic acids are NOT DETECTED.  The SARS-CoV-2 RNA is generally detectable in upper respiratory specimens during the acute phase of infection. The lowest concentration of SARS-CoV-2 viral copies this assay can detect is 138 copies/mL. A negative result does not preclude SARS-Cov-2 infection and should not be used as the sole basis for treatment or other patient management decisions. A negative result may  occur with  improper specimen collection/handling, submission of specimen other than nasopharyngeal swab, presence of viral mutation(s) within the areas targeted by this assay, and inadequate number of viral copies(<138 copies/mL). A negative result must be combined with clinical observations, patient history, and epidemiological information. The expected result is Negative.  Fact Sheet for Patients:  EntrepreneurPulse.com.au  Fact Sheet for Healthcare Providers:  IncredibleEmployment.be  This test is no t yet approved or cleared by the [place] FDA and  has been authorized for detection and/or diagnosis of SARS-CoV-2 by FDA under an Emergency Use Authorization (EUA). This EUA will remain  in effect (meaning this test can be used) for the duration of the COVID-19 declaration under Section 564(b)(1) of the Act, 21 U.S.C.section 360bbb-3(b)(1), unless the authorization is terminated  or revoked sooner.       Influenza A by PCR NEGATIVE NEGATIVE Final   Influenza B by PCR NEGATIVE NEGATIVE Final    Comment: (NOTE) The Xpert Xpress SARS-CoV-2/FLU/RSV plus assay is intended as an aid in the diagnosis of  influenza from Nasopharyngeal swab specimens and should not be used as a sole basis for treatment. Nasal washings and aspirates are unacceptable for Xpert Xpress SARS-CoV-2/FLU/RSV testing.  Fact Sheet for Patients: EntrepreneurPulse.com.au  Fact Sheet for Healthcare Providers: IncredibleEmployment.be  This test is not yet approved or cleared by the [place] FDA and has been authorized for detection and/or diagnosis of SARS-CoV-2 by FDA under an Emergency Use Authorization (EUA). This EUA will remain in effect (meaning this test can be used) for the duration of the COVID-19 declaration under Section 564(b)(1) of the Act, 21 U.S.C. section 360bbb-3(b)(1), unless the authorization is terminated  or revoked.  Performed at [facility], [address] [place], [place] [address]   MRSA Next Gen by PCR, Nasal     Status: None   Collection Time: 11/13/20 11:27 PM   Specimen: Nasal Mucosa; Nasal Swab  Result Value Ref Range Status   MRSA by PCR Next Gen NOT DETECTED NOT DETECTED Final    Comment: (NOTE) The GeneXpert MRSA Assay (FDA approved for NASAL specimens only), is one component of a comprehensive MRSA colonization surveillance program. It is not intended to diagnose MRSA infection nor to guide or monitor treatment for MRSA infections. Test performance is not FDA approved in patients less than 11 years old. Performed at [facility], [place] [address], [place], [place] [address]      Labs: Basic Metabolic Panel: Recent Labs  Lab 11/13/20 1105 11/13/20 2313 11/14/20 0209 11/16/20 0834 11/16/20 0842 11/16/20 1011 11/17/20 0306  NA 134* 132* 133* 137  137 136 135 135  K 4.6 4.7 4.7 4.5  4.4 4.5 4.5 4.6  CL 95* 95* 94*  --   --  93* 92*  CO2 30 30 32  --   --  35* 35*  GLUCOSE 162* 230* 196*  --   --  171* 145*  BUN 11 7* 6*  --   --  27* 24*  CREATININE 0.70 0.69 0.59*  --   --  0.89 0.79  CALCIUM 9.0 9.1 9.3  --   --  9.0 9.1  MG  --  1.8 1.8  --   --   --   --   PHOS  --  3.1 2.9  --   --   --   --    Liver Function Tests: Recent Labs  Lab 11/13/20 2313 11/14/20 0209  AST 19 16  ALT 22 20  ALKPHOS 63 64  BILITOT 1.3* 1.2  PROT 7.4 7.1  ALBUMIN 4.0 3.8   No results for input(s): LIPASE, AMYLASE in the last 168 hours. Recent Labs  Lab 11/13/20 2313  AMMONIA 36*   CBC: Recent Labs  Lab 11/13/20 1105 11/14/20 0209 11/15/20 1920 11/16/20 0834 11/16/20 0842 11/16/20 1011 11/17/20 0306 11/18/20 0157  WBC 10.3 12.8*  --   --   --  10.8* 7.2 7.8  NEUTROABS 7.2 11.5*  --   --   --   --   --   --   HGB 19.5* 18.4*   < > 20.1*  20.1* 20.1* 18.2* 18.6* 18.0*  HCT 60.5* 57.5*   < > 59.0*  59.0* 59.0* 56.9* 57.8* 57.1*  MCV 94.5  94.7  --   --   --  95.2 93.8 94.1  PLT 178 152  --   --   --  156 153 151   < > = values in this interval not displayed.   Cardiac  Enzymes: Recent Labs  Lab 11/13/20 2313  CKTOTAL 42*   BNP: BNP (last 3 results) Recent Labs    11/13/20 1105  BNP 136.4*    ProBNP (last 3 results) Recent Labs    12/31/19 1031  PROBNP 151.0*    CBG: Recent Labs  Lab 11/17/20 1311 11/17/20 1551 11/17/20 1619 11/17/20 1957 11/18/20 0731  GLUCAP 129* 258* 213* 102* 155*       Signed:  Florencia Reasons MD, PhD, FACP  [facility] 11/18/2020, 10:07 AM

## 2020-11-18 NOTE — Progress Notes (Signed)
Discharge note  Discharge instructions reviewed with patient. He verbalized understanding. Belongings and medicines given to patient. Awaiting for son to take him home. O2 tank at the bedside.

## 2020-11-18 NOTE — Progress Notes (Signed)
Patient discharged: Home with family  Via: Wheelchair   Discharge paperwork given: to patient and family  Reviewed with teach back  IV and telemetry disconnected  Belongings given to patient    

## 2020-11-18 NOTE — Progress Notes (Signed)
Cardiology Progress Note  Patient ID: Scott Jensen MRN: [id number] DOB: April 28, 1956 Date of Encounter: 11/18/2020  Primary Cardiologist: None  Subjective   Currently feeling well.  No chest pain or shortness of breath.  Is fortunately remained in sinus rhythm after cardioversion.  Inpatient Medications  Scheduled Meds:  albuterol  5 mg/hr Nebulization Once   apixaban  5 mg Oral BID   aspirin EC  81 mg Oral Daily   atorvastatin  80 mg Oral Daily   doxycycline  100 mg Oral Q12H   fluticasone  1 spray Each Nare Daily   folic acid  1 mg Oral Daily   insulin aspart  0-15 Units Subcutaneous Q4H   ipratropium-albuterol  3 mL Nebulization TID   loratadine  10 mg Oral Daily   mometasone-formoterol  2 puff Inhalation BID   predniSONE  40 mg Oral Q breakfast   sodium chloride flush  3 mL Intravenous Q12H   sodium chloride flush  3 mL Intravenous Q12H   sodium chloride flush  3 mL Intravenous Q12H   spironolactone  25 mg Oral Daily   thiamine  100 mg Oral Daily   torsemide  20 mg Oral Daily   Continuous Infusions:  sodium chloride     sodium chloride     PRN Meds: sodium chloride, sodium chloride, acetaminophen, albuterol, diazepam, fentaNYL (SUBLIMAZE) injection, guaiFENesin, nitroGLYCERIN, ondansetron (ZOFRAN) IV, sodium chloride flush, sodium chloride flush, traMADol   Vital Signs   Vitals:   11/17/20 1300 11/17/20 1619 11/17/20 1938 11/17/20 2026  BP:  (!) 134/58 127/68   Pulse: 88 88 77   Resp:  19 17   Temp:  (!) 97.5 F (36.4 C) 97.6 F (36.4 C)   TempSrc:  Oral Oral   SpO2: 99% 96% 98% 98%  Weight:      Height:        Intake/Output Summary (Last 24 hours) at 11/18/2020 0811 Last data filed at 11/18/2020 0700 Gross per 24 hour  Intake 1220 ml  Output 2975 ml  Net -1755 ml    Last 3 Weights 11/17/2020 11/17/2020 11/16/2020  Weight (lbs) 244 lb 11.4 oz 244 lb 11.2 oz 248 lb 3.2 oz  Weight (kg) 111 kg 110.995 kg 112.583 kg      Telemetry   Sinus  rhythm-personally reviewed  Physical Exam   Vitals:   11/17/20 1300 11/17/20 1619 11/17/20 1938 11/17/20 2026  BP:  (!) 134/58 127/68   Pulse: 88 88 77   Resp:  19 17   Temp:  (!) 97.5 F (36.4 C) 97.6 F (36.4 C)   TempSrc:  Oral Oral   SpO2: 99% 96% 98% 98%  Weight:      Height:        Intake/Output Summary (Last 24 hours) at 11/18/2020 0811 Last data filed at 11/18/2020 0700 Gross per 24 hour  Intake 1220 ml  Output 2975 ml  Net -1755 ml     Last 3 Weights 11/17/2020 11/17/2020 11/16/2020  Weight (lbs) 244 lb 11.4 oz 244 lb 11.2 oz 248 lb 3.2 oz  Weight (kg) 111 kg 110.995 kg 112.583 kg    Body mass index is 37.21 kg/m.   GEN: Well nourished, well developed, in no acute distress  HEENT: normal  Neck: no JVD, carotid bruits, or masses Cardiac: RRR; no murmurs, rubs, or gallops,no edema  Respiratory:  clear to auscultation bilaterally, normal work of breathing GI: soft, nontender, nondistended, + BS MS: no deformity or atrophy  Skin: warm and  dry Neuro:  Strength and sensation are intact Psych: euthymic mood, full affect   Labs  High Sensitivity Troponin:   Recent Labs  Lab 11/13/20 1105 11/13/20 1348 11/14/20 1959 11/14/20 2201 11/14/20 2329  TROPONINIHS 18* 15 15 45* 65*      Cardiac EnzymesNo results for input(s): TROPONINI in the last 168 hours. No results for input(s): TROPIPOC in the last 168 hours.  Chemistry Recent Labs  Lab 11/13/20 2313 11/14/20 0209 11/16/20 0834 11/16/20 0842 11/16/20 1011 11/17/20 0306  NA 132* 133*   < > 136 135 135  K 4.7 4.7   < > 4.5 4.5 4.6  CL 95* 94*  --   --  93* 92*  CO2 30 32  --   --  35* 35*  GLUCOSE 230* 196*  --   --  171* 145*  BUN 7* 6*  --   --  27* 24*  CREATININE 0.69 0.59*  --   --  0.89 0.79  CALCIUM 9.1 9.3  --   --  9.0 9.1  PROT 7.4 7.1  --   --   --   --   ALBUMIN 4.0 3.8  --   --   --   --   AST 19 16  --   --   --   --   ALT 22 20  --   --   --   --   ALKPHOS 63 64  --   --   --   --    BILITOT 1.3* 1.2  --   --   --   --   GFRNONAA >60 >60  --   --  >60 >60  ANIONGAP 7 7  --   --  7 8   < > = values in this interval not displayed.     Hematology Recent Labs  Lab 11/16/20 1011 11/17/20 0306 11/18/20 0157  WBC 10.8* 7.2 7.8  RBC 5.98* 6.16* 6.07*  HGB 18.2* 18.6* 18.0*  HCT 56.9* 57.8* 57.1*  MCV 95.2 93.8 94.1  MCH 30.4 30.2 29.7  MCHC 32.0 32.2 31.5  RDW 14.7 14.5 14.4  PLT 156 153 151    BNP Recent Labs  Lab 11/13/20 1105  BNP 136.4*     DDimer No results for input(s): DDIMER in the last 168 hours.   Radiology  CARDIAC CATHETERIZATION  Result Date: 11/16/2020 Formatting of this result is different from the original.   Mid LM to [place] LAD lesion is 30% stenosed.   Ost Cx lesion is 30% stenosed.   Ost LAD lesion is 30% stenosed.   Mid LAD lesion is 40% stenosed.   Mid Cx lesion is 30% stenosed.   2nd Mrg lesion is 30% stenosed.   Prox RCA lesion is 30% stenosed.   The left ventricular systolic function is normal.   The left ventricular ejection fraction is 55-65% by visual estimate. 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  There is 40% stenosis in the LAD between the first and second diagonal vessel.  Circumflex vessel has 30% stenosis in the OM vessel and AV groove after the OM1 takeoff.  The RCA is a dominant vessel with 30% smooth proximal to mid narrowing. Normal to hyperdynamic LV function with EF estimate at 60 to 65%.  There is evidence suggesting moderate concentric left ventricular hypertrophy.  There were no focal segmental wall motion abnormalities. Elevated right heart pressures with moderately-severe pulmonary  arterial hypertension with PA pressure 74/30, mean 43. Increased PVR at 6.7 WU. RECOMMENDATION: The patient was in atrial flutter throughout the procedure.  Heparin Ginger Leeth be resumed 10 hours post procedure.  Initiate aggressive lipid-lowering therapy.  Medical therapy for CAD.   Plan for restoration of sinus rhythm.  Consider pulmonary evaluation for pulmonary arterial hypertension  ECHO TEE  Result Date: 11/17/2020    TRANSESOPHOGEAL ECHO REPORT   Patient Name:   Scott Jensen Date of Exam: 11/17/2020 Medical Rec #:  [id number]      Height:       68.0 in Accession #:    [id number]     Weight:       244.7 lb Date of Birth:  10/19/1955       BSA:          2.227 m Patient Age:    25 years       BP:           146/79 mmHg Patient Gender: M              HR:           81 bpm. Exam Location:  Inpatient Procedure: Transesophageal Echo, Cardiac Doppler and Color Doppler Indications:     I48.92* Unspecified atrial flutter  History:         Patient has prior history of Echocardiogram examinations, most                  recent 11/14/2020. Cardiomegaly and CHF, CAD, Abnormal ECG,                  COPD, Arrythmias:Atrial Flutter, Signs/Symptoms:Chest Pain and                  Dyspnea; Risk Factors:Dyslipidemia, Current Smoker and                  Hypertension.  Sonographer:     Roseanna Rainbow RDCS Referring Phys:  [id number] Darreld Mclean Diagnosing Phys: Lyman Bishop MD PROCEDURE: After discussion of the risks and benefits of a TEE, an informed consent was obtained from the patient. The transesophogeal probe was passed without difficulty through the esophogus of the patient. Imaged were obtained with the patient in a supine position. Sedation performed by different physician. The patient was monitored while under deep sedation. Anesthestetic sedation was provided intravenously by Anesthesiology: 196.'5mg'$  of Propofol, '100mg'$  of Lidocaine. The patient developed Respiratory depression during the procedure. A successful direct current cardioversion was performed at 120 joules with 1 attempt. IMPRESSIONS  1. Left ventricular ejection fraction, by estimation, is 65 to 70%. The left ventricle has normal function. There is moderate left ventricular hypertrophy.  2. Right ventricular systolic function is normal. The  right ventricular size is mildly enlarged.  3. Left atrial size was moderately dilated. No left atrial/left atrial appendage thrombus was detected.  4. Right atrial size was mildly dilated.  5. The mitral valve is grossly normal. Trivial mitral valve regurgitation.  6. The aortic valve is tricuspid. Aortic valve regurgitation is mild to moderate. Mild aortic valve sclerosis is present, with no evidence of aortic valve stenosis. Conclusion(s)/Recommendation(s): No LA/LAA thrombus identified. Successful cardioversion performed with restoration of normal sinus rhythm. FINDINGS  Left Ventricle: Left ventricular ejection fraction, by estimation, is 65 to 70%. The left ventricle has normal function. The left ventricular internal cavity size was normal in size. There is moderate left ventricular hypertrophy. Right Ventricle: The right ventricular size is mildly enlarged.  No increase in right ventricular wall thickness. Right ventricular systolic function is normal. Left Atrium: Left atrial size was moderately dilated. No left atrial/left atrial appendage thrombus was detected. Right Atrium: Right atrial size was mildly dilated. Pericardium: There is no evidence of pericardial effusion. Mitral Valve: The mitral valve is grossly normal. Trivial mitral valve regurgitation. Tricuspid Valve: The tricuspid valve is grossly normal. Tricuspid valve regurgitation is mild. Aortic Valve: The aortic valve is tricuspid. Aortic valve regurgitation is mild to moderate. Mild aortic valve sclerosis is present, with no evidence of aortic valve stenosis. Pulmonic Valve: The pulmonic valve was grossly normal. Pulmonic valve regurgitation is not visualized. Aorta: The aortic root and ascending aorta are structurally normal, with no evidence of dilitation. IAS/Shunts: No atrial level shunt detected by color flow Doppler. Lyman Bishop MD Electronically signed by Lyman Bishop MD Signature Date/Time: 11/17/2020/2:52:04 PM    Final     Cardiac  Studies  TTE 11/14/2020  1. Left ventricular ejection fraction, by estimation, is 65 to 70%. The  left ventricle has normal function. The left ventricle has no regional  wall motion abnormalities. There is moderate concentric left ventricular  hypertrophy. Left ventricular  diastolic parameters are consistent with Grade I diastolic dysfunction  (impaired relaxation). There is the interventricular septum is flattened  in systole, consistent with right ventricular pressure overload.   2. Right ventricular systolic function is mildly reduced. The right  ventricular size is mildly enlarged. Mildly increased right ventricular  wall thickness.   3. Left atrial size was mild to moderately dilated.   4. Right atrial size was moderately dilated.   5. The mitral valve is normal in structure. No evidence of mitral valve  regurgitation.   6. The aortic valve is tricuspid. There is mild calcification of the  aortic valve. Aortic valve regurgitation is not visualized. Mild aortic  valve sclerosis is present, with no evidence of aortic valve stenosis.   7. The inferior vena cava is dilated in size with <50% respiratory  variability, suggesting right atrial pressure of 15 mmHg.   RHC/LHC 11/16/2020    Mid LM to Ost LAD lesion is 30% stenosed.   Ost Cx lesion is 30% stenosed.   Ost LAD lesion is 30% stenosed.   Mid LAD lesion is 40% stenosed.   Mid Cx lesion is 30% stenosed.   2nd Mrg lesion is 30% stenosed.   Prox RCA lesion is 30% stenosed.   The left ventricular systolic function is normal.   The left ventricular ejection fraction is 55-65% by visual estimate.   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  There is 40% stenosis in the LAD between the first and second diagonal vessel.  Circumflex vessel has 30% stenosis in the OM vessel and AV groove after the OM1 takeoff.  The RCA is a dominant vessel with 30% smooth proximal to mid  narrowing.   Normal to hyperdynamic LV function with EF estimate at 60 to 65%.  There is evidence suggesting moderate concentric left ventricular hypertrophy.  There were no focal segmental wall motion abnormalities.   Elevated right heart pressures with moderately-severe pulmonary arterial hypertension with PA pressure 74/30, mean 43.   Increased PVR at 6.7 WU.   RECOMMENDATION: The patient was in atrial flutter throughout the procedure.  Heparin Kuulei Kleier be resumed 10 hours post procedure.  Initiate aggressive lipid-lowering therapy.  Medical therapy for CAD.  Plan for restoration of sinus rhythm.  Consider  pulmonary evaluation for pulmonary arterial hypertension  Patient Profile  65 year old male with history of hypertension, hyperlipidemia, diabetes (A1c 7.4, on no medications at home), COPD, prior tobacco abuse, OSA, morbid obesity who was admitted on 11/13/2020 with acute chest pain and found to have acute on chronic hypercapnic/hypoxic respiratory failure secondary to COPD exacerbation and likely diastolic heart failure.  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  He is now status post cardioversion.  He has moderate pulmonary hypertension.  Currently on torsemide with good urine output.  We Loring Liskey continue torsemide at the current dose.  Suspect that COPD is contributing to his pulmonary hypertension.  2.  Atrial flutter: Likely driven by right heart failure.  Currently on Eliquis 5 mg.  Status post TEE and cardioversion.  Remains in sinus rhythm.  3.  Nonobstructive coronary artery disease: Continue aspirin and statin.    4.  Hypertension: Currently well controlled.    At this point, okay for discharge from cardiology standpoint.  We Flora Ratz discharge on current medications including oral torsemide and Eliquis.  He already has follow-up  made in cardiology clinic.  For questions or updates, please contact [place] Please consult [email or web address] for contact info under      Signed, Allegra Lai, [place]  11/18/2020 8:11 AM

## 2020-11-20 ENCOUNTER — Telehealth: Payer: Self-pay

## 2020-11-20 ENCOUNTER — Encounter (HOSPITAL_COMMUNITY): Payer: Self-pay | Admitting: Internal Medicine

## 2020-11-20 NOTE — Telephone Encounter (Signed)
Transition Care Management Follow-up Telephone Call Date of discharge and from where: 11/18/2020-[facility] How have you been since you were released from the hospital? Pretty good Any questions or concerns? Yes-Patient states he cannot afford the Eliquis. He used a free 1 month voucher to get it when he lfet the hospital but states he will not be able to pay for it next month. Patient advised to discuss with PCP & cardiology at his f/u visits.  Items Reviewed: Did the pt receive and understand the discharge instructions provided? Yes  Medications obtained and verified? Yes  Other? Yes  Any new allergies since your discharge? No  Dietary orders reviewed? Yes Do you have support at home? Yes   Home Care and Equipment/Supplies: Were home health services ordered? no If so, what is the name of the agency? N/a  Has the agency set up a time to come to the patient's home? not applicable Were any new equipment or medical supplies ordered?  Yes: oxygen What is the name of the medical supply agency? unknown Were you able to get the supplies/equipment? yes Do you have any questions related to the use of the equipment or supplies? No  Functional Questionnaire: (I = Independent and D = Dependent) ADLs: I  Bathing/Dressing- I  Meal Prep- I  Eating- I  Maintaining continence- I  Transferring/Ambulation- I  Managing Meds- I  Follow up appointments reviewed:  PCP Hospital f/u appt confirmed? Yes  Scheduled to see Mackie Pai on 11/27/2020 @ 9:40. [facility] f/u appt confirmed? Yes  Scheduled to see Almyra Deforest on 12/08/2020 @ 12:15. Are transportation arrangements needed? No  If their condition worsens, is the pt aware to call PCP or go to the Emergency Dept.? Yes Was the patient provided with contact information for the PCP's office or ED? Yes Was to pt encouraged to call back with questions or concerns? Yes

## 2020-11-23 ENCOUNTER — Other Ambulatory Visit (HOSPITAL_BASED_OUTPATIENT_CLINIC_OR_DEPARTMENT_OTHER): Payer: Self-pay

## 2020-11-27 ENCOUNTER — Encounter: Payer: Self-pay | Admitting: Medical

## 2020-11-27 ENCOUNTER — Other Ambulatory Visit: Payer: Self-pay

## 2020-11-27 ENCOUNTER — Ambulatory Visit (INDEPENDENT_AMBULATORY_CARE_PROVIDER_SITE_OTHER): Payer: Medicare Other | Admitting: Medical

## 2020-11-27 VITALS — BP 135/70 | HR 94 | Temp 98.1°F | Resp 18 | Ht 68.0 in | Wt 239.2 lb

## 2020-11-27 DIAGNOSIS — Z720 Tobacco use: Secondary | ICD-10-CM

## 2020-11-27 DIAGNOSIS — E785 Hyperlipidemia, unspecified: Secondary | ICD-10-CM | POA: Diagnosis not present

## 2020-11-27 DIAGNOSIS — J432 Centrilobular emphysema: Secondary | ICD-10-CM

## 2020-11-27 DIAGNOSIS — I10 Essential (primary) hypertension: Secondary | ICD-10-CM

## 2020-11-27 DIAGNOSIS — I4891 Unspecified atrial fibrillation: Secondary | ICD-10-CM | POA: Diagnosis not present

## 2020-11-27 DIAGNOSIS — I5032 Chronic diastolic (congestive) heart failure: Secondary | ICD-10-CM

## 2020-11-27 LAB — COMPREHENSIVE METABOLIC PANEL
ALT: 66 U/L — ABNORMAL HIGH (ref 0–53)
AST: 36 U/L (ref 0–37)
Albumin: 4.3 g/dL (ref 3.5–5.2)
Alkaline Phosphatase: 88 U/L (ref 39–117)
BUN: 15 mg/dL (ref 6–23)
CO2: 40 mEq/L — ABNORMAL HIGH (ref 19–32)
Calcium: 9.8 mg/dL (ref 8.4–10.5)
Chloride: 87 mEq/L — ABNORMAL LOW (ref 96–112)
Creatinine, Ser: 0.78 mg/dL (ref 0.40–1.50)
GFR: 93.05 mL/min (ref >60.00)
Glucose, Bld: 128 mg/dL — ABNORMAL HIGH (ref 70–99)
Potassium: 5 mEq/L (ref 3.5–5.1)
Sodium: 133 mEq/L — ABNORMAL LOW (ref 135–145)
Total Bilirubin: 1.6 mg/dL — ABNORMAL HIGH (ref 0.2–1.2)
Total Protein: 7.3 g/dL (ref 6.0–8.3)

## 2020-11-27 LAB — CBC WITH DIFFERENTIAL/PLATELET
Basophils Absolute: 0.1 10*3/uL (ref 0.0–0.1)
Basophils Relative: 0.7 % (ref 0.0–3.0)
Eosinophils Absolute: 0.1 10*3/uL (ref 0.0–0.7)
Eosinophils Relative: 0.7 % (ref 0.0–5.0)
HCT: 52.5 % — ABNORMAL HIGH (ref 39.0–52.0)
Hemoglobin: 17.4 g/dL — ABNORMAL HIGH (ref 13.0–17.0)
Lymphocytes Relative: 9.2 % — ABNORMAL LOW (ref 12.0–46.0)
Lymphs Abs: 1 10*3/uL (ref 0.7–4.0)
MCHC: 33.1 g/dL (ref 30.0–36.0)
MCV: 91.2 fl (ref 78.0–100.0)
Monocytes Absolute: 1.2 10*3/uL — ABNORMAL HIGH (ref 0.1–1.0)
Monocytes Relative: 10.4 % (ref 3.0–12.0)
Neutro Abs: 9 10*3/uL — ABNORMAL HIGH (ref 1.4–7.7)
Neutrophils Relative %: 79 % — ABNORMAL HIGH (ref 43.0–77.0)
Platelets: 231 10*3/uL (ref 150.0–400.0)
RBC: 5.76 Mil/uL (ref 4.22–5.81)
RDW: 15 % (ref 11.5–15.5)
WBC: 11.4 10*3/uL — ABNORMAL HIGH (ref 4.0–10.5)

## 2020-11-27 LAB — BRAIN NATRIURETIC PEPTIDE: Pro B Natriuretic peptide (BNP): 35 pg/mL (ref 0.0–100.0)

## 2020-11-27 MED ORDER — METFORMIN HCL 500 MG PO TABS: 500.0000 mg | ORAL_TABLET | Freq: Every day | ORAL | 3 refills | Status: DC

## 2020-11-27 NOTE — Addendum Note (Signed)
Addended by: Anabel Halon on: 11/27/2020 08:03 PM   Modules accepted: Orders

## 2020-11-27 NOTE — Patient Instructions (Addendum)
Clinically stable post hospitalization.  History of emphysema and asthma per chart review.  Continue Advair and albuterol.  Also continue O2 as instructed by hospital.  I went ahead and placed referral to pulmonologist.  History of new onset CHF per chart review.  Mild elevated troponin seen on review of hospitalization notes.  Continue spironolactone, torsemide and Eliquis.  Keep appointment with cardiologist.  In the future could arrange for cardiologist in [facility] suggested we will continue location.  Hypertension.  On second check today BP controlled with current diuretics.  Hx of smoking.  Please stop smoking completely.  Keep your upcoming appointment with me or be seen sooner if needed.

## 2020-11-27 NOTE — Progress Notes (Signed)
Subjective:    Patient ID: Scott Jensen, male    DOB: 1955/12/04, 65 y.o.   MRN: [id number]  HPI    Pt in for follow up from hospitalization. Also first time with me.   Pt states he feels like new man since he started on O2 %. He states for past 2 years he would often have 02 sat 84-87%.   Pt states over past 8 month he smoked 5 packs of cigarettes. He stopped smoking 2010-2020. When covid started he got back on cigaretes. When younger 65 yo started smoking. [phone] when in [employer] he did not smoke.20 years overall and 2 packs a day.   Recent hospitalization dates.  Admit date: 11/13/2020 Discharge date: 11/18/2020  Recommendations for Outpatient Follow-up:  F/u with PCP within a week  for hospital discharge follow up, repeat cbc/bmp at follow up F/u with cardiology Home oxygen ordered Encourage outpatient sleep study   History of present illness: ( per admitting MD Dr Roel Cluck) Scott Jensen is a 65 y.o. male with medical history significant of Pulmonary hypertension HLD, HTN, asthma/COPD, diastolic CHF, tobacco abuse     Presented with   CP this AM that radiated to his neck also associated shortness of breath noted to be satting 82% on room air upon arrival. Shortness of breath started yesterday and today became more severe.  Also some central chest pain radiating towards the neck.  Worse with deep inspiration. He has chronic leg edema which has been studied in the past and negative for DVT last year    Has been smoking on off. Now not really Case of beer lasts week Reports he has regular loose stools Has daily headaches after taking his BP and takes tylenol 3 tabs at a time times a day   Reports wheezing, coughing   Reports chronic hypoxia with O2 running in low 80's at baseline He was supposed to be on oxygen some time ago but did not qualify bc he was borderline at the time and never readdressed it since.   Acute hypoxic respiratory failure -Patient reports he  was recommended to use oxygen at home but he declined in the past, likely has component of chronic hypoxic respiratory failure, as evidenced by chronic polycythemia - acute short of breath and hypoxic respiratory failure likely from mild COPD exacerbation, acute on chronic diastolic CHF, A. fib could also contribute to short of breath -he meets home o2 criteria, home o2 ordered,    COPD exacerbation Treated with steroid, DuoNeb, doxycycline Mild scattered wheezing/rhonchi on exam initially, has resolved at discharge Chest x-ray no acute infiltrate  Chest pain, non-STEMI, was on  heparin drip, s/p cardiac cath on 7/21 (Initiate aggressive lipid-lowering therapy.  Medical therapy for CAD.  Plan for restoration of sinus rhythm.  Consider pulmonary evaluation for pulmonary arterial hypertension ), chest pain resolved, he is cleared to discharge home by cardiology,  ASA, Lipitor New onset A. fib/a flutter, -was on  Cardizem drip, s/p cardioversion, now sinus rhythm,  off heparin drip, started on Eliquis on 7/21 Acute on chronic diastolic CHF, was on IV Lasix then Lasix drip , now on demadex, spironolactone per cardiology recommendation He is cleared to discharge home by cardiology, he is to follow up with cardiology   Diabetes, uncontrolled, with hyperglycemia Does not appear to be on medication at home A1c 7.4% Per cardiology note plan to start SGLT2 inhibitor , will defer to cardiology. He is going to see cardiology in two weeks He received  insulin sliding scale while on short course of steroid in the hospital, am blood glucose 145, he is follow up with pcp   Hypertension Appears on telmisartan and Lasix at home Now on Currently on demadex and spironolactone per cardiology recommendation   Cirrhosis, incidental finding on Ct obtained from ED  lft wnl Hepatitis panel negative Outpatient follow up   Pt has follow up with Cardiologist 12/08/2020.  Most of time pt is on 2 liters oxygen.  Has tanks and has 02 concentrator.    Pt admits very poor diet in regards to his eating. He explains this is reason for a1c of 7.4. In past he states could control sugar with diet alone. States covid messed his diet up.   Review of Systems  Constitutional:  Negative for chills, fatigue and fever.  HENT:  Negative for congestion and drooling.   Respiratory:  Negative for cough, chest tightness, shortness of breath and wheezing.   Cardiovascular:  Negative for chest pain and palpitations.  Gastrointestinal:  Negative for abdominal distention, abdominal pain, blood in stool, nausea and vomiting.  Endocrine: Negative for polydipsia and polyuria.  Genitourinary:  Negative for dysuria and frequency.  Musculoskeletal:  Negative for back pain, myalgias and neck pain.  Skin:  Negative for rash.  Neurological:  Negative for dizziness, seizures, syncope, weakness, numbness and headaches.  Hematological:  Negative for adenopathy. Does not bruise/bleed easily.  Psychiatric/Behavioral:  Negative for behavioral problems and decreased concentration. The patient is not nervous/anxious and is not hyperactive.     Past Medical History:  Diagnosis Date   Cervical adenopathy    Steel plate C5-C6   Environmental allergies    History of chicken pox    Hyperglycemia    Borderline Daibetes   Hypertension    Increased pressure in the eye    Measles    Mumps    Seasonal allergies      Social History   Socioeconomic History   Marital status: Divorced    Spouse name: Not on file   Number of children: Not on file   Years of education: Not on file   Highest education level: Not on file  Occupational History   Not on file  Tobacco Use   Smoking status: Light Smoker    Years: 3.00    Types: Cigarettes   Smokeless tobacco: Never  Vaping Use   Vaping Use: Never used  Substance and Sexual Activity   Alcohol use: Yes    Alcohol/week: 5.0 standard drinks    Types: 5 Standard drinks or equivalent  per week    Comment: beer only   Drug use: No   Sexual activity: Yes    Partners: Female  Other Topics Concern   Not on file  Social History Narrative   Not on file   Social Determinants of Health   Financial Resource Strain: Not on file  Food Insecurity: Not on file  Transportation Needs: Not on file  Physical Activity: Not on file  Stress: Not on file  Social Connections: Not on file  Intimate Partner Violence: Not on file    Past Surgical History:  Procedure Laterality Date   CARDIOVERSION N/A 11/17/2020   Procedure: CARDIOVERSION;  Surgeon: Pixie Casino, MD;  Location: [place];  Service: Cardiovascular;  Laterality: N/A;   Cervical Hardware Placement      CHOLECYSTECTOMY     COLONOSCOPY     Q5 yrs   RIGHT/LEFT HEART CATH AND CORONARY ANGIOGRAPHY N/A 11/16/2020   Procedure:  RIGHT/LEFT HEART CATH AND CORONARY ANGIOGRAPHY;  Surgeon: Troy Sine, MD;  Location: [place] [facility];  Service: Cardiovascular;  Laterality: N/A;   TEE WITHOUT CARDIOVERSION N/A 11/17/2020   Procedure: TRANSESOPHAGEAL ECHOCARDIOGRAM (TEE);  Surgeon: Pixie Casino, MD;  Location: [facility] ENDOSCOPY;  Service: Cardiovascular;  Laterality: N/A;   TONSILLECTOMY     WISDOM TOOTH EXTRACTION      Family History  Problem Relation Age of Onset   Brain cancer Mother 30       Deceased   Liver cancer Mother        Mets   Anxiety disorder Mother    Colon cancer Father 10       Deceased   Alcoholism Father    Cancer Other        Aunts & Uncles   Leukemia Sister    Leukemia Cousin    Hypertension Brother    Mental illness Brother     No Known Allergies  Current Outpatient Medications on File Prior to Visit  Medication Sig Dispense Refill   albuterol (VENTOLIN HFA) 108 (90 Base) MCG/ACT inhaler Inhale 1-2 puffs into the lungs every 6 (six) hours as needed for wheezing or shortness of breath. 18 each 6   apixaban (ELIQUIS) 5 MG TABS tablet Take 1 tablet (5 mg total) by mouth 2 (two) times  daily. 60 tablet 0   aspirin 81 MG EC tablet Take 1 tablet (81 mg total) by mouth daily. Swallow whole. 30 tablet 11   atorvastatin (LIPITOR) 80 MG tablet Take 1 tablet (80 mg total) by mouth daily. 30 tablet 0   Fluticasone-Salmeterol (ADVAIR DISKUS) 250-50 MCG/DOSE AEPB Inhale 1 puff into the lungs 2 (two) times daily. 1 each 3   spironolactone (ALDACTONE) 25 MG tablet Take 1 tablet (25 mg total) by mouth daily. 30 tablet 0   torsemide (DEMADEX) 20 MG tablet Take 1 tablet (20 mg total) by mouth daily. 30 tablet 0   No current facility-administered medications on file prior to visit.    BP (!) 142/69   Pulse 94   Temp 98.1 F (36.7 C)   Resp 18   Ht '5\' 8"'$  (1.727 m)   Wt 239 lb 3.2 oz (108.5 kg)   SpO2 99%   BMI 36.37 kg/m        Objective:   Physical Exam  General Mental Status- Alert. General Appearance- Not in acute distress.   Skin General: Color- Normal Color. Moisture- Normal Moisture.  Neck Carotid Arteries- Normal color. Moisture- Normal Moisture. No carotid bruits. No JVD.  Chest and Lung Exam Auscultation: Breath Sounds:-Normal.  Cardiovascular Auscultation:Rythm- Regular. Murmurs & Other Heart Sounds:Auscultation of the heart reveals- No Murmurs.  Abdomen Inspection:-Inspeection Normal. Palpation/Percussion:Note:No mass. Palpation and Percussion of the abdomen reveal- Non Tender, Non Distended + BS, no rebound or guarding.   Neurologic Cranial Nerve exam:- CN III-XII intact(No nystagmus), symmetric smile. Strength:- 5/5 equal and symmetric strength both upper and lower extremities.   Lower ext- no pedal edema. Calfs symmetric. Negative homans signs.     Assessment & Plan:  Clinically stable post hospitalization.  History of emphysema and asthma per chart review.  Continue Advair and albuterol.  Also continue O2 as instructed by hospital.  I went ahead and placed referral to pulmonologist.  History of new onset CHF per chart review.  Mild elevated  troponin seen on review of hospitalization notes.  Continue spironolactone, torsemide and Eliquis.  Keep appointment with cardiologist.  In the future could arrange  for cardiologist in [facility] suggested we will continue location.  Hypertension.  On second check today BP controlled with current diuretics.  Hx of smoking.  Please stop smoking completely.  Keep your upcoming appointment with me or be seen sooner if needed.  Mackie Pai, PA-C   Time spent with patient today was  50 minutes which consisted of chart review(hospital follow up and new pt), discussing diagnosis, work up, treatment and documentation.

## 2020-12-04 ENCOUNTER — Ambulatory Visit (INDEPENDENT_AMBULATORY_CARE_PROVIDER_SITE_OTHER): Payer: Medicare Other | Admitting: Medical

## 2020-12-04 ENCOUNTER — Other Ambulatory Visit: Payer: Self-pay

## 2020-12-04 VITALS — BP 136/66 | HR 87 | Resp 18 | Ht 68.0 in | Wt 241.0 lb

## 2020-12-04 DIAGNOSIS — I5032 Chronic diastolic (congestive) heart failure: Secondary | ICD-10-CM

## 2020-12-04 DIAGNOSIS — E119 Type 2 diabetes mellitus without complications: Secondary | ICD-10-CM | POA: Diagnosis not present

## 2020-12-04 DIAGNOSIS — J432 Centrilobular emphysema: Secondary | ICD-10-CM

## 2020-12-04 DIAGNOSIS — I10 Essential (primary) hypertension: Secondary | ICD-10-CM | POA: Diagnosis not present

## 2020-12-04 DIAGNOSIS — E785 Hyperlipidemia, unspecified: Secondary | ICD-10-CM | POA: Diagnosis not present

## 2020-12-04 DIAGNOSIS — I4891 Unspecified atrial fibrillation: Secondary | ICD-10-CM

## 2020-12-04 NOTE — Patient Instructions (Addendum)
For diabetes with a1c 7.4 3 weeks ago decided to prescribe low dose metformin. Recommend low sugar diet.  History of emphysema and asthma per chart review.  Continue Advair and albuterol.  Also continue O2 as instructed by hospital.  I went ahead and placed referral to pulmonologist.   History of new onset CHF. Continue spironolactone, torsemide and Eliquis.  Keep appointment with cardiologist.  In the future could arrange for cardiologist in [facility] suggested we will continue location.   Hypertension.  On second check today BP controlled with current diuretics.   Hx of smoking.  Please stop smoking completely.  Follow up 3 months or sooner if needed.

## 2020-12-04 NOTE — Progress Notes (Signed)
Subjective:    Patient ID: Scott Jensen, male    DOB: 1956-02-23, 65 y.o.   MRN: [id number]  HPI  Pt in to discuss his sugar level. On lab other day his sugar was 128. His last 3 month sugar average was 163. Pt in past had controlled with diet. He is willing to use low dose metformin. Has good kidney function.  A1c was 7.4 3 weeks ago.  Pt did find his glucometer. Fasting in morning 109. After eating 130 range.   Hx of high cholesterol. Pt is on atorvastatin.  Pt bp well controlled. BP 136/66.   Hx of copd and asthma. On advair and albuterol.  Hx of chf. On torsemide, spironolactone and eliquis. Pt called his insurance to ask about high price of eliquis.      Review of Systems  Constitutional:  Negative for appetite change, diaphoresis, fatigue and fever.  HENT:  Negative for congestion, drooling and ear pain.   Respiratory:  Negative for chest tightness, shortness of breath and wheezing.   Cardiovascular:  Negative for chest pain and palpitations.  Gastrointestinal:  Negative for abdominal pain, constipation and nausea.  Genitourinary:  Negative for dysuria.  Musculoskeletal:  Negative for back pain, joint swelling and myalgias.  Skin:  Negative for rash.  Neurological:  Negative for dizziness, weakness, numbness and headaches.  Hematological:  Negative for adenopathy. Does not bruise/bleed easily.  Psychiatric/Behavioral:  Negative for behavioral problems, confusion and sleep disturbance. The patient is not nervous/anxious.    Past Medical History:  Diagnosis Date   Cervical adenopathy    Steel plate C5-C6   Environmental allergies    History of chicken pox    Hyperglycemia    Borderline Daibetes   Hypertension    Increased pressure in the eye    Measles    Mumps    Seasonal allergies      Social History   Socioeconomic History   Marital status: Divorced    Spouse name: Not on file   Number of children: Not on file   Years of education: Not on file    Highest education level: Not on file  Occupational History   Not on file  Tobacco Use   Smoking status: Light Smoker    Years: 3.00    Types: Cigarettes   Smokeless tobacco: Never  Vaping Use   Vaping Use: Never used  Substance and Sexual Activity   Alcohol use: Yes    Alcohol/week: 5.0 standard drinks    Types: 5 Standard drinks or equivalent per week    Comment: beer only   Drug use: No   Sexual activity: Yes    Partners: Female  Other Topics Concern   Not on file  Social History Narrative   Not on file   Social Determinants of Health   Financial Resource Strain: Not on file  Food Insecurity: Not on file  Transportation Needs: Not on file  Physical Activity: Not on file  Stress: Not on file  Social Connections: Not on file  Intimate Partner Violence: Not on file    Past Surgical History:  Procedure Laterality Date   CARDIOVERSION N/A 11/17/2020   Procedure: CARDIOVERSION;  Surgeon: Pixie Casino, MD;  Location: [place];  Service: Cardiovascular;  Laterality: N/A;   Cervical Hardware Placement      CHOLECYSTECTOMY     COLONOSCOPY     Q5 yrs   RIGHT/LEFT HEART CATH AND CORONARY ANGIOGRAPHY N/A 11/16/2020   Procedure: RIGHT/LEFT HEART CATH AND  CORONARY ANGIOGRAPHY;  Surgeon: Troy Sine, MD;  Location: [place] [facility];  Service: Cardiovascular;  Laterality: N/A;   TEE WITHOUT CARDIOVERSION N/A 11/17/2020   Procedure: TRANSESOPHAGEAL ECHOCARDIOGRAM (TEE);  Surgeon: Pixie Casino, MD;  Location[facility] ENDOSCOPY;  Service: Cardiovascular;  Laterality: N/A;   TONSILLECTOMY     WISDOM TOOTH EXTRACTION      Family History  Problem Relation Age of Onset   Brain cancer Mother 66       Deceased   Liver cancer Mother        Mets   Anxiety disorder Mother    Colon cancer Father 67       Deceased   Alcoholism Father    Cancer Other        Aunts & Uncles   Leukemia Sister    Leukemia Cousin    Hypertension Brother    Mental illness Brother     No  Known Allergies  Current Outpatient Medications on File Prior to Visit  Medication Sig Dispense Refill   albuterol (VENTOLIN HFA) 108 (90 Base) MCG/ACT inhaler Inhale 1-2 puffs into the lungs every 6 (six) hours as needed for wheezing or shortness of breath. 18 each 6   apixaban (ELIQUIS) 5 MG TABS tablet Take 1 tablet (5 mg total) by mouth 2 (two) times daily. 60 tablet 0   aspirin 81 MG EC tablet Take 1 tablet (81 mg total) by mouth daily. Swallow whole. 30 tablet 11   atorvastatin (LIPITOR) 80 MG tablet Take 1 tablet (80 mg total) by mouth daily. 30 tablet 0   Fluticasone-Salmeterol (ADVAIR DISKUS) 250-50 MCG/DOSE AEPB Inhale 1 puff into the lungs 2 (two) times daily. 1 each 3   metFORMIN (GLUCOPHAGE) 500 MG tablet Take 1 tablet (500 mg total) by mouth daily with breakfast. 30 tablet 3   spironolactone (ALDACTONE) 25 MG tablet Take 1 tablet (25 mg total) by mouth daily. 30 tablet 0   torsemide (DEMADEX) 20 MG tablet Take 1 tablet (20 mg total) by mouth daily. 30 tablet 0   No current facility-administered medications on file prior to visit.    BP 136/66   Pulse 87   Resp 18   Ht '5\' 8"'$  (1.727 m)   Wt 241 lb (109.3 kg)   SpO2 95%   BMI 36.64 kg/m        Objective:   Physical Exam  General Mental Status- Alert. General Appearance- Not in acute distress.   Skin General: Color- Normal Color. Moisture- Normal Moisture.  Neck Carotid Arteries- Normal color. Moisture- Normal Moisture. No carotid bruits. No JVD.  Chest and Lung Exam Auscultation: Breath Sounds:-Normal.  Cardiovascular Auscultation:Rythm- Regular. Murmurs & Other Heart Sounds:Auscultation of the heart reveals- No Murmurs.  Abdomen Inspection:-Inspeection Normal. Palpation/Percussion:Note:No mass. Palpation and Percussion of the abdomen reveal- Non Tender, Non Distended + BS, no rebound or guarding.   Neurologic Cranial Nerve exam:- CN III-XII intact(No nystagmus), symmetric smile.   Strength:- 5/5  equal and symmetric strength both upper and lower extremities.   Lower ext- no pedal edema. Negative homans signs.       Assessment & Plan:  For diabetes with a1c 7.4 3 weeks ago decided to prescribe low dose metformin. Recommend low sugar diet.  History of emphysema and asthma per chart review.  Continue Advair and albuterol.  Also continue O2 as instructed by hospital.  I went ahead and placed referral to pulmonologist.   History of new onset CHF. Continue spironolactone, torsemide and Eliquis.  Keep appointment with cardiologist.  In the future could arrange for cardiologist in [facility] suggested we will continue location.   Hypertension.  On second check today BP controlled with current diuretics.   Hx of smoking.  Please stop smoking completely.  Follow up 3 months or sooner if needed.

## 2020-12-08 ENCOUNTER — Ambulatory Visit (INDEPENDENT_AMBULATORY_CARE_PROVIDER_SITE_OTHER): Payer: Medicare Other | Admitting: Physician Assistant

## 2020-12-08 ENCOUNTER — Other Ambulatory Visit: Payer: Self-pay

## 2020-12-08 ENCOUNTER — Encounter: Payer: Self-pay | Admitting: Physician Assistant

## 2020-12-08 VITALS — BP 120/60 | HR 81 | Ht 68.0 in | Wt 240.0 lb

## 2020-12-08 DIAGNOSIS — I4892 Unspecified atrial flutter: Secondary | ICD-10-CM | POA: Diagnosis not present

## 2020-12-08 DIAGNOSIS — I2510 Atherosclerotic heart disease of native coronary artery without angina pectoris: Secondary | ICD-10-CM

## 2020-12-08 DIAGNOSIS — I10 Essential (primary) hypertension: Secondary | ICD-10-CM

## 2020-12-08 DIAGNOSIS — E119 Type 2 diabetes mellitus without complications: Secondary | ICD-10-CM

## 2020-12-08 DIAGNOSIS — E785 Hyperlipidemia, unspecified: Secondary | ICD-10-CM

## 2020-12-08 DIAGNOSIS — I272 Pulmonary hypertension, unspecified: Secondary | ICD-10-CM | POA: Diagnosis not present

## 2020-12-08 NOTE — Patient Instructions (Signed)
Medication Instructions:  Continue current medications  *If you need a refill on your cardiac medications before your next appointment, please call your pharmacy*   Lab Work: None Ordered If you have labs (blood work) drawn today and your tests are completely normal, you will receive your results only by: [place] (if you have MyChart) OR A paper copy in the mail If you have any lab test that is abnormal or we need to change your treatment, we will call you to review the results.   Testing/Procedures: None Ordered   Follow-Up: At [facility], you and your health needs are our priority.  As part of our continuing mission to provide you with exceptional heart care, we have created designated Provider Care Teams.  These Care Teams include your primary Cardiologist (physician) and Advanced Practice Providers (APPs -  Physician Assistants and Nurse Practitioners) who all work together to provide you with the care you need, when you need it.  We recommend signing up for the patient portal called "MyChart".  Sign up information is provided on this After Visit Summary.  MyChart is used to connect with patients for Virtual Visits (Telemedicine).  Patients are able to view lab/test results, encounter notes, upcoming appointments, etc.  Non-urgent messages can be sent to your provider as well.   To learn more about what you can do with MyChart, go to [email or web address].    Your next appointment:   3 month(s)  The format for your next appointment:   In Person  Provider:   You may see Evalina Field, MD or one of the following Advanced Practice Providers on your designated Care Team:   Almyra Deforest, PA-C Fabian Sharp, PA-C or  Roby Lofts, [place]

## 2020-12-08 NOTE — Progress Notes (Signed)
Cardiology Office Note:    Date:  12/10/2020   ID:  Scott Jensen, DOB 1956-03-01, MRN [id number]  PCP:  Elise Benne   [facility] Providers Cardiologist:  Evalina Field, MD     Referring MD: Mackie Pai, [place]   Chief Complaint  Patient presents with   Follow-up    Seen for Dr. Audie Box    History of Present Illness:    Scott Jensen is a 65 y.o. male with a hx of hypertension, hyperlipidemia, DM2, COPD, obstructive sleep apnea not on CPAP, cervical adenopathy, tobacco abuse, atrial flutter and CAD.  Patient was previously seen by Dr. Wynonia Lawman in August 2018 for tachycardia palpitation with heart rate up to 160 bpm with associated chest discomfort.  Cardiac enzyme was negative, EKG was unremarkable.  Echocardiogram showed EF 55 to 60% with normal wall motion.  He was started on Cardizem with plan for outpatient heart monitor and stress test.  He was also treated for COPD exacerbation at the time.  Outpatient study including stress test and the monitor were normal according to the patient.  Since then, he has been admitted to [facility] in March 2020 with COVID.  He has had chronic dyspnea since that time.  More recently, he went to the [facility] in July 2022 with chest pain and shortness of breath.  Blood pressure was 205/131.  O2 saturation 82%.  Echocardiogram revealed EF 65 to 70% with more normal wall motion, moderate LVH, grade 1 DD, RV was mildly enlarged with mildly reduced systolic function, interventricular septum flattening in the systole consistent with RV pressure overload.  EKG demonstrated atrial flutter with RVR with heart rate 148.  Subsequent cardiac catheterization obtained on 11/16/2020 showed diffuse mild disease.  Right heart cath demonstrated moderate to severe pulmonary arterial hypertension with PA pressure 74/30 with mean pressure of 43.  Medical therapy was recommended.  Patient subsequently underwent TEE cardioversion on 11/17/2020.  TEE  revealed normal EF 65 to 70%, negative PFO, mild to moderate central AI, mild TR, no LAA thrombus.  He had a successful DCCV with a single 120 J biphasic shock to sinus rhythm.  It was suspected his symptom is from pulmonary hypertension secondary to COPD and RV failure.  It was recommended to pursue aggressive treatment of his lung disease and obstructive sleep apnea.  Patient was initially transitioned to 20 mg daily of p.o. torsemide.  Patient presents today for follow-up.  Breathing has significantly improved since the recent hospitalization.  On exam.  He does not appears to have significant lower extremity edema.  He also denies any orthopnea or PND.  We reviewed the recent hospital record.  We emphasized on the importance of managing his lung issue.  Otherwise, since discharge, he has had repeat blood work that showed stable renal function and electrolyte.  Past Medical History:  Diagnosis Date   Cervical adenopathy    Steel plate C5-C6   Environmental allergies    History of chicken pox    Hyperglycemia    Borderline Daibetes   Hypertension    Increased pressure in the eye    Measles    Mumps    Seasonal allergies     Past Surgical History:  Procedure Laterality Date   CARDIOVERSION N/A 11/17/2020   Procedure: CARDIOVERSION;  Surgeon: Pixie Casino, MD;  Location: [facility] ENDOSCOPY;  Service: Cardiovascular;  Laterality: N/A;   Cervical Hardware Placement      CHOLECYSTECTOMY     COLONOSCOPY  Q5 yrs   RIGHT/LEFT HEART CATH AND CORONARY ANGIOGRAPHY N/A 11/16/2020   Procedure: RIGHT/LEFT HEART CATH AND CORONARY ANGIOGRAPHY;  Surgeon: Troy Sine, MD;  Location: [place] CV LAB;  Service: Cardiovascular;  Laterality: N/A;   TEE WITHOUT CARDIOVERSION N/A 11/17/2020   Procedure: TRANSESOPHAGEAL ECHOCARDIOGRAM (TEE);  Surgeon: Pixie Casino, MD;  Location: [facility] ENDOSCOPY;  Service: Cardiovascular;  Laterality: N/A;   TONSILLECTOMY     WISDOM TOOTH EXTRACTION      Current  Medications: Current Meds  Medication Sig   albuterol (VENTOLIN HFA) 108 (90 Base) MCG/ACT inhaler Inhale 1-2 puffs into the lungs every 6 (six) hours as needed for wheezing or shortness of breath.   apixaban (ELIQUIS) 5 MG TABS tablet Take 1 tablet (5 mg total) by mouth 2 (two) times daily.   aspirin 81 MG EC tablet Take 1 tablet (81 mg total) by mouth daily. Swallow whole.   atorvastatin (LIPITOR) 80 MG tablet Take 1 tablet (80 mg total) by mouth daily.   Fluticasone-Salmeterol (ADVAIR DISKUS) 250-50 MCG/DOSE AEPB Inhale 1 puff into the lungs 2 (two) times daily.   metFORMIN (GLUCOPHAGE) 500 MG tablet Take 1 tablet (500 mg total) by mouth daily with breakfast.   spironolactone (ALDACTONE) 25 MG tablet Take 1 tablet (25 mg total) by mouth daily.   torsemide (DEMADEX) 20 MG tablet Take 1 tablet (20 mg total) by mouth daily.     Allergies:   Patient has no known allergies.   Social History   Socioeconomic History   Marital status: Divorced    Spouse name: Not on file   Number of children: Not on file   Years of education: Not on file   Highest education level: Not on file  Occupational History   Not on file  Tobacco Use   Smoking status: Light Smoker    Years: 3.00    Types: Cigarettes   Smokeless tobacco: Never  Vaping Use   Vaping Use: Never used  Substance and Sexual Activity   Alcohol use: Yes    Alcohol/week: 5.0 standard drinks    Types: 5 Standard drinks or equivalent per week    Comment: beer only   Drug use: No   Sexual activity: Yes    Partners: Female  Other Topics Concern   Not on file  Social History Narrative   Not on file   Social Determinants of Health   Financial Resource Strain: Not on file  Food Insecurity: Not on file  Transportation Needs: Not on file  Physical Activity: Not on file  Stress: Not on file  Social Connections: Not on file     Family History: The patient's family history includes Alcoholism in his father; Anxiety disorder in his  mother; Brain cancer (age of onset: 73) in his mother; Cancer in an other family member; Colon cancer (age of onset: 19) in his father; Hypertension in his brother; Leukemia in his cousin and sister; Liver cancer in his mother; Mental illness in his brother.  ROS:   Please see the history of present illness.     All other systems reviewed and are negative.  EKGs/Labs/Other Studies Reviewed:    The following studies were reviewed today:  Cath 11/16/2020   Mid LM to Ost LAD lesion is 30% stenosed.   Ost Cx lesion is 30% stenosed.   Ost LAD lesion is 30% stenosed.   Mid LAD lesion is 40% stenosed.   Mid Cx lesion is 30% stenosed.   2nd Mrg lesion  is 30% stenosed.   Prox RCA lesion is 30% stenosed.   The left ventricular systolic function is normal.   The left ventricular ejection fraction is 55-65% by visual estimate.   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  There is 40% stenosis in the LAD between the first and second diagonal vessel.  Circumflex vessel has 30% stenosis in the OM vessel and AV groove after the OM1 takeoff.  The RCA is a dominant vessel with 30% smooth proximal to mid narrowing.   Normal to hyperdynamic LV function with EF estimate at 60 to 65%.  There is evidence suggesting moderate concentric left ventricular hypertrophy.  There were no focal segmental wall motion abnormalities.   Elevated right heart pressures with moderately-severe pulmonary arterial hypertension with PA pressure 74/30, mean 43.   Increased PVR at 6.7 WU.   RECOMMENDATION: The patient was in atrial flutter throughout the procedure.  Heparin will be resumed 10 hours post procedure.  Initiate aggressive lipid-lowering therapy.  Medical therapy for CAD.  Plan for restoration of sinus rhythm.  Consider pulmonary evaluation for pulmonary arterial hypertension  EKG:  EKG is ordered today.  The ekg ordered today demonstrates rhythm, no  significant ST-T wave changes  Recent Labs: 11/13/2020: B Natriuretic Peptide 136.4 11/14/2020: Magnesium 1.8; TSH 0.449 11/27/2020: ALT 66; BUN 15; Creatinine, Ser 0.78; Hemoglobin 17.4; Platelets 231.0; Potassium 5.0; Pro B Natriuretic peptide (BNP) 35.0; Sodium 133  Recent Lipid Panel    Component Value Date/Time   CHOL 142 11/13/2020 2313   TRIG 33 11/13/2020 2313   HDL 53 11/13/2020 2313   CHOLHDL 2.7 11/13/2020 2313   VLDL 7 11/13/2020 2313   LDLCALC 82 11/13/2020 2313     Risk Assessment/Calculations:    CHA2DS2-VASc Score = 5  This indicates a 7.2% annual risk of stroke. The patient's score is based upon: CHF History: Yes HTN History: Yes Diabetes History: Yes Stroke History: No Vascular Disease History: Yes Age Score: 1 Gender Score: 0          Physical Exam:    VS:  BP 120/60   Pulse 81   Ht '5\' 8"'$  (1.727 m)   Wt 240 lb (108.9 kg)   SpO2 97%   BMI 36.49 kg/m     Wt Readings from Last 3 Encounters:  12/08/20 240 lb (108.9 kg)  12/04/20 241 lb (109.3 kg)  11/27/20 239 lb 3.2 oz (108.5 kg)     GEN:  Well nourished, well developed in no acute distress HEENT: Normal NECK: No JVD; No carotid bruits LYMPHATICS: No lymphadenopathy CARDIAC: RRR, no murmurs, rubs, gallops RESPIRATORY:  Clear to auscultation without rales, wheezing or rhonchi  ABDOMEN: Soft, non-tender, non-distended MUSCULOSKELETAL:  No edema; No deformity  SKIN: Warm and dry NEUROLOGIC:  Alert and oriented x 3 PSYCHIATRIC:  Normal affect   ASSESSMENT:    1. Atrial flutter, unspecified type ([place])   2. Pulmonary hypertension, unspecified ([place])   3. Primary hypertension   4. Hyperlipidemia LDL goal <70   5. Controlled type 2 diabetes mellitus without complication, without long-term current use of insulin ([place])   6. Coronary artery disease involving native coronary artery of native heart without angina pectoris    PLAN:    In order of problems listed above:  Atrial flutter: Recently  underwent cardioversion.  Maintaining sinus rhythm based on EKG.  Continue Eliquis.  Not on any AV nodal blocking agent  Pulmonary hypertension: Continue torsemide  20 mg daily.  Repeat lab work since discharge showed stable renal function and electrolyte.  We will need to focus on treating lung issue and sleep apnea  Hypertension: Blood pressure well controlled  Hyperlipidemia: On Lipitor  DM2: On metformin.  Managed by primary care provider  Nonobstructive CAD: Recent cardiac catheterization revealed diffuse 30% disease without severe obstruction.  Denies any recent chest pain.  Question if the patient really need aspirin given the fact he is on Eliquis.        Medication Adjustments/Labs and Tests Ordered: Current medicines are reviewed at length with the patient today.  Concerns regarding medicines are outlined above.  No orders of the defined types were placed in this encounter.  No orders of the defined types were placed in this encounter.   Patient Instructions  Medication Instructions:  Continue current medications  *If you need a refill on your cardiac medications before your next appointment, please call your pharmacy*   Lab Work: None Ordered If you have labs (blood work) drawn today and your tests are completely normal, you will receive your results only by: [place] (if you have MyChart) OR A paper copy in the mail If you have any lab test that is abnormal or we need to change your treatment, we will call you to review the results.   Testing/Procedures: None Ordered   Follow-Up: At [facility], you and your health needs are our priority.  As part of our continuing mission to provide you with exceptional heart care, we have created designated Provider Care Teams.  These Care Teams include your primary Cardiologist (physician) and Advanced Practice Providers (APPs -  Physician Assistants and Nurse Practitioners) who all work together to provide you with  the care you need, when you need it.  We recommend signing up for the patient portal called "MyChart".  Sign up information is provided on this After Visit Summary.  MyChart is used to connect with patients for Virtual Visits (Telemedicine).  Patients are able to view lab/test results, encounter notes, upcoming appointments, etc.  Non-urgent messages can be sent to your provider as well.   To learn more about what you can do with MyChart, go to [email or web address].    Your next appointment:   3 month(s)  The format for your next appointment:   In Person  Provider:   You may see Evalina Field, MD or one of the following Advanced Practice Providers on your designated Care Team:   Almyra Deforest, PA-C Fabian Sharp, PA-C or  Roby Lofts, PA-C    Signed, [place], [place]  12/10/2020 11:56 PM    [place]

## 2020-12-10 ENCOUNTER — Encounter: Payer: Self-pay | Admitting: Physician Assistant

## 2020-12-12 ENCOUNTER — Telehealth: Payer: Self-pay

## 2020-12-12 NOTE — Telephone Encounter (Signed)
Faxed application to [employer] Patient Scott Jensen (BMSPAF). Called and informed patient that everything was faxed to foundations.

## 2020-12-18 ENCOUNTER — Other Ambulatory Visit (HOSPITAL_COMMUNITY): Payer: Self-pay

## 2020-12-19 ENCOUNTER — Telehealth: Payer: Self-pay | Admitting: Medical

## 2020-12-19 MED ORDER — SPIRONOLACTONE 25 MG PO TABS: 25.0000 mg | ORAL_TABLET | Freq: Every day | ORAL | 0 refills | Status: DC

## 2020-12-19 MED ORDER — TORSEMIDE 20 MG PO TABS: 20.0000 mg | ORAL_TABLET | Freq: Every day | ORAL | 0 refills | Status: DC

## 2020-12-19 MED ORDER — ATORVASTATIN CALCIUM 80 MG PO TABS: 80.0000 mg | ORAL_TABLET | Freq: Every day | ORAL | 0 refills | Status: DC

## 2020-12-19 NOTE — Telephone Encounter (Signed)
Patient assistance [employer] has updated application. Printed and made patient aware that need updated signature and information for new form. Patient stated he does not have transpiration at this time and if I could mail to him and he will bring back to [place] once complete.  Verified address, no additional questions at this time.

## 2020-12-19 NOTE — Telephone Encounter (Signed)
Medication: atorvastatin (LIPITOR) 80 MG tablet [id number]    spironolactone (ALDACTONE) 25 MG tablet  torsemide (DEMADEX) 20 MG tablet [id number] Has the patient contacted their pharmacy?  (If no, reYes.  quest that the patient contact the pharmacy for the refill.) (If yes, when and what did the pharmacy advise?) He was informed to call us to see about refill options   Preferred Pharmacy (with phone number or street name):  [facility] [address] [place], [place] - [address]  [address], [place] [address]  Phone:  [phone]     Agent: Please be advised that RX refills may take up to 3 business days. We ask that you follow-up with your pharmacy.

## 2020-12-19 NOTE — Telephone Encounter (Signed)
Rx sent 

## 2020-12-26 NOTE — Telephone Encounter (Signed)
Patient dropped off to the office his filled out and signed updated application along with a new letter of benefit verification. All updated information (application, agreement/consent, prescription/prescriber information, etc.) was faxed to foundation.   Called patient and informed him of everything. He verbalized understanding and stated that he only has 1 week left of his Eliquis. I informed patient that  I will check our samples and give him a call back. He thanked me for calling.

## 2020-12-28 NOTE — Telephone Encounter (Signed)
Called patient and informed him that the BMSPAF is requesting a copy of his 2021 federal tax return if he still files if not then they will accept the Social Security verification letter that was submitted as proof of his income. Patient verbalized understanding and all (if any) were answered.

## 2021-01-12 ENCOUNTER — Other Ambulatory Visit: Payer: Self-pay | Admitting: Medical

## 2021-02-12 ENCOUNTER — Other Ambulatory Visit: Payer: Self-pay | Admitting: Medical

## 2021-02-15 ENCOUNTER — Encounter: Payer: Self-pay | Admitting: Medical

## 2021-03-06 ENCOUNTER — Encounter: Payer: Self-pay | Admitting: Medical

## 2021-03-06 ENCOUNTER — Ambulatory Visit (INDEPENDENT_AMBULATORY_CARE_PROVIDER_SITE_OTHER): Payer: Medicare Other | Admitting: Medical

## 2021-03-06 ENCOUNTER — Other Ambulatory Visit: Payer: Self-pay

## 2021-03-06 VITALS — BP 128/75 | HR 84 | Ht 68.0 in | Wt 245.0 lb

## 2021-03-06 DIAGNOSIS — I2510 Atherosclerotic heart disease of native coronary artery without angina pectoris: Secondary | ICD-10-CM

## 2021-03-06 DIAGNOSIS — E785 Hyperlipidemia, unspecified: Secondary | ICD-10-CM

## 2021-03-06 DIAGNOSIS — E119 Type 2 diabetes mellitus without complications: Secondary | ICD-10-CM | POA: Diagnosis not present

## 2021-03-06 DIAGNOSIS — J432 Centrilobular emphysema: Secondary | ICD-10-CM

## 2021-03-06 DIAGNOSIS — I5032 Chronic diastolic (congestive) heart failure: Secondary | ICD-10-CM | POA: Diagnosis not present

## 2021-03-06 DIAGNOSIS — I10 Essential (primary) hypertension: Secondary | ICD-10-CM

## 2021-03-06 DIAGNOSIS — I4891 Unspecified atrial fibrillation: Secondary | ICD-10-CM

## 2021-03-06 LAB — COMPREHENSIVE METABOLIC PANEL
ALT: 26 U/L (ref 0–53)
AST: 18 U/L (ref 0–37)
Albumin: 4.4 g/dL (ref 3.5–5.2)
Alkaline Phosphatase: 80 U/L (ref 39–117)
BUN: 13 mg/dL (ref 6–23)
CO2: 33 mEq/L — ABNORMAL HIGH (ref 19–32)
Calcium: 9.2 mg/dL (ref 8.4–10.5)
Chloride: 93 mEq/L — ABNORMAL LOW (ref 96–112)
Creatinine, Ser: 0.68 mg/dL (ref 0.40–1.50)
GFR: 96.81 mL/min (ref >60.00)
Glucose, Bld: 230 mg/dL — ABNORMAL HIGH (ref 70–99)
Potassium: 4.1 mEq/L (ref 3.5–5.1)
Sodium: 133 mEq/L — ABNORMAL LOW (ref 135–145)
Total Bilirubin: 0.8 mg/dL (ref 0.2–1.2)
Total Protein: 6.8 g/dL (ref 6.0–8.3)

## 2021-03-06 LAB — CBC WITH DIFFERENTIAL/PLATELET
Basophils Absolute: 0 10*3/uL (ref 0.0–0.1)
Basophils Relative: 0.8 % (ref 0.0–3.0)
Eosinophils Absolute: 0 10*3/uL (ref 0.0–0.7)
Eosinophils Relative: 0.9 % (ref 0.0–5.0)
HCT: 38.7 % — ABNORMAL LOW (ref 39.0–52.0)
Hemoglobin: 13.3 g/dL (ref 13.0–17.0)
Lymphocytes Relative: 21.6 % (ref 12.0–46.0)
Lymphs Abs: 1 10*3/uL (ref 0.7–4.0)
MCHC: 34.4 g/dL (ref 30.0–36.0)
MCV: 90.6 fl (ref 78.0–100.0)
Monocytes Absolute: 0.5 10*3/uL (ref 0.1–1.0)
Monocytes Relative: 9.8 % (ref 3.0–12.0)
Neutro Abs: 3.2 10*3/uL (ref 1.4–7.7)
Neutrophils Relative %: 66.9 % (ref 43.0–77.0)
Platelets: 157 10*3/uL (ref 150.0–400.0)
RBC: 4.27 Mil/uL (ref 4.22–5.81)
RDW: 13.5 % (ref 11.5–15.5)
WBC: 4.8 10*3/uL (ref 4.0–10.5)

## 2021-03-06 LAB — HEMOGLOBIN A1C: Hgb A1c MFr Bld: 9.4 % — ABNORMAL HIGH (ref 4.6–6.5)

## 2021-03-06 LAB — BRAIN NATRIURETIC PEPTIDE: Pro B Natriuretic peptide (BNP): 29 pg/mL (ref 0.0–100.0)

## 2021-03-06 MED ORDER — METFORMIN HCL 1000 MG PO TABS: 1000.0000 mg | ORAL_TABLET | Freq: Two times a day (BID) | ORAL | 3 refills | Status: DC

## 2021-03-06 NOTE — Addendum Note (Signed)
Addended by: Anabel Halon on: 03/06/2021 05:34 PM   Modules accepted: Orders

## 2021-03-06 NOTE — Progress Notes (Signed)
Subjective:    Patient ID: Scott Jensen, male    DOB: 10-11-1955, 65 y.o.   MRN: [id number]  HPI  Pt in to discuss his sugar level. On lab other day his sugar was 128. His last 3 month sugar average was 163. Pt in past had controlled with diet. He is willing to use low dose metformin. Has good kidney function.   A1c was 7.4 in mid summer.    Pt did find his glucometer. Pt states his sugars have been over 300-340 after he eats. Fasting labs 106-120.   Hx of high cholesterol. Pt is on atorvastatin.   Pt bp well controlled when he checks at home. He shows me at home readings.    Hx of copd and asthma. On advair and albuterol. He is on 1 L. See pulmonlogist.   Hx of chf. On torsemide, spironolactone and eliquis. Pt states he could never get prior authorized for eliquis(out of med for one month). Was not covered. He has appointment next week with cardiologist.   Atrial fibrillation in past with [place] score.  CHA2DS2-VASc Score = 5   This indicates a 7.2% annual risk of stroke. The patient's score is based upon: CHF History: Yes HTN History: Yes Diabetes History: Yes Stroke History: No Vascular Disease History: Yes Age Score: 1 Gender Score: 0   Pt tells me that xarelto would cost $200 a month. He states too expensive.    Review of Systems  Constitutional:  Negative for chills, fatigue and fever.  HENT:  Negative for congestion.   Respiratory:  Negative for cough, chest tightness, shortness of breath and wheezing.   Cardiovascular:  Negative for chest pain and palpitations.  Gastrointestinal:  Negative for abdominal pain.  Genitourinary:  Negative for dysuria, frequency and penile pain.  Musculoskeletal:  Negative for back pain.  Skin:  Negative for rash.  Neurological:  Negative for dizziness, seizures, speech difficulty, light-headedness, numbness and headaches.  Hematological:  Negative for adenopathy. Does not bruise/bleed easily.  Psychiatric/Behavioral:  Negative  for behavioral problems, confusion, self-injury and sleep disturbance. The patient is not nervous/anxious.     Past Medical History:  Diagnosis Date   Cervical adenopathy    Steel plate C5-C6   Environmental allergies    History of chicken pox    Hyperglycemia    Borderline Daibetes   Hypertension    Increased pressure in the eye    Measles    Mumps    Seasonal allergies      Social History   Socioeconomic History   Marital status: Divorced    Spouse name: Not on file   Number of children: Not on file   Years of education: Not on file   Highest education level: Not on file  Occupational History   Not on file  Tobacco Use   Smoking status: Light Smoker    Years: 3.00    Types: Cigarettes   Smokeless tobacco: Never  Vaping Use   Vaping Use: Never used  Substance and Sexual Activity   Alcohol use: Yes    Alcohol/week: 5.0 standard drinks    Types: 5 Standard drinks or equivalent per week    Comment: beer only   Drug use: No   Sexual activity: Yes    Partners: Female  Other Topics Concern   Not on file  Social History Narrative   Not on file   Social Determinants of Health   Financial Resource Strain: Not on file  Food Insecurity: Not on  file  Transportation Needs: Not on file  Physical Activity: Not on file  Stress: Not on file  Social Connections: Not on file  Intimate Partner Violence: Not on file    Past Surgical History:  Procedure Laterality Date   CARDIOVERSION N/A 11/17/2020   Procedure: CARDIOVERSION;  Surgeon: Pixie Casino, MD;  Location: [place];  Service: Cardiovascular;  Laterality: N/A;   Cervical Hardware Placement      CHOLECYSTECTOMY     COLONOSCOPY     Q5 yrs   RIGHT/LEFT HEART CATH AND CORONARY ANGIOGRAPHY N/A 11/16/2020   Procedure: RIGHT/LEFT HEART CATH AND CORONARY ANGIOGRAPHY;  Surgeon: Troy Sine, MD;  Location: [place] CV LAB;  Service: Cardiovascular;  Laterality: N/A;   TEE WITHOUT CARDIOVERSION N/A 11/17/2020    Procedure: TRANSESOPHAGEAL ECHOCARDIOGRAM (TEE);  Surgeon: Pixie Casino, MD;  Location: [facility] ENDOSCOPY;  Service: Cardiovascular;  Laterality: N/A;   TONSILLECTOMY     WISDOM TOOTH EXTRACTION      Family History  Problem Relation Age of Onset   Brain cancer Mother 62       Deceased   Liver cancer Mother        Mets   Anxiety disorder Mother    Colon cancer Father 78       Deceased   Alcoholism Father    Cancer Other        Aunts & Uncles   Leukemia Sister    Leukemia Cousin    Hypertension Brother    Mental illness Brother     No Known Allergies  Current Outpatient Medications on File Prior to Visit  Medication Sig Dispense Refill   albuterol (VENTOLIN HFA) 108 (90 Base) MCG/ACT inhaler Inhale 1-2 puffs into the lungs every 6 (six) hours as needed for wheezing or shortness of breath. 18 each 6   apixaban (ELIQUIS) 5 MG TABS tablet Take 1 tablet (5 mg total) by mouth 2 (two) times daily. 60 tablet 0   aspirin 81 MG EC tablet Take 1 tablet (81 mg total) by mouth daily. Swallow whole. 30 tablet 11   atorvastatin (LIPITOR) 80 MG tablet Take 1 tablet by mouth once daily 30 tablet 0   Fluticasone-Salmeterol (ADVAIR DISKUS) 250-50 MCG/DOSE AEPB Inhale 1 puff into the lungs 2 (two) times daily. 1 each 3   metFORMIN (GLUCOPHAGE) 500 MG tablet Take 1 tablet (500 mg total) by mouth daily with breakfast. 30 tablet 3   spironolactone (ALDACTONE) 25 MG tablet Take 1 tablet by mouth once daily 30 tablet 0   torsemide (DEMADEX) 20 MG tablet Take 1 tablet by mouth once daily 30 tablet 0   No current facility-administered medications on file prior to visit.    BP 128/75   Pulse 84   Wt 245 lb (111.1 kg)   SpO2 98%   BMI 37.25 kg/m       Objective:   Physical Exam  General Mental Status- Alert. General Appearance- Not in acute distress.   Skin General: Color- Normal Color. Moisture- Normal Moisture.  Neck Carotid Arteries- Normal color. Moisture- Normal Moisture. No carotid  bruits. No JVD.  Chest and Lung Exam Auscultation: Breath Sounds:-Normal.  Cardiovascular Auscultation:Rythm- Regular. Murmurs & Other Heart Sounds:Auscultation of the heart reveals- No Murmurs.  Abdomen Inspection:-Inspeection Normal. Palpation/Percussion:Note:No mass. Palpation and Percussion of the abdomen reveal- Non Tender, Non Distended + BS, no rebound or guarding.    Neurologic Cranial Nerve exam:- CN III-XII intact(No nystagmus), symmetric smile. Strength:- 5/5 equal and symmetric strength both  upper and lower extremities.   Lower extremity-no pedal edema.    Assessment & Plan:   Patient Instructions  Atrial fibrillation with CHADS2 score of 5.  On review he has been off of Eliquis for at least 1 month.  Issues getting that covered by your insurance.  I did give you samples of Xarelto starter pack today.  You can start 1 tablet twice a day and then 3 weeks take 1 tablet daily as package instructs.  Please keep your appointment with cardiologist in 10 days to discuss whether or not you will be on Eliquis, Xarelto or may be Coumadin.  Hypertension well-controlled recently.  Diabetes with last A1c of 7.4 but recent sugars after eating in the 300-340 range.  Continue current diabetic medications.  We will get CMP and A1c today.  After review might prescribe prescribe insulin or make referral to endocrinologist.  Hyperlipidemia.  Continue atorvastatin.    COPD.  Good oxygenation presently at 98%.  Continue Advair and albuterol.  Continue 1 L oxygen as well.  On review of chart indicates CHF.  Clinically stable.  Continue torsemide and spironolactone.  We will add BNP to your labs today.  Follow-up in 3 to 4 weeks or sooner if needed.     Mackie Pai, PA-C   Time spent with patient today was 45  minutes which consisted of chart review, discussing diagnosis, work up treatment and documentation.

## 2021-03-06 NOTE — Patient Instructions (Addendum)
Atrial fibrillation with CHADS2 score of 5.  On review he has been off of Eliquis for at least 1 month.  Issues getting that covered by your insurance.  I did give you samples of Xarelto starter pack today.  You can start 1 tablet twice a day and then 3 weeks take 1 tablet daily as package instructs.  Please keep your appointment with cardiologist in 10 days to discuss whether or not you will be on Eliquis, Xarelto or may be Coumadin.  Hypertension well-controlled recently.  Diabetes with last A1c of 7.4 but recent sugars after eating in the 300-340 range.  Continue current diabetic medications.  We will get CMP and A1c today.  After review might prescribe prescribe insulin or make referral to endocrinologist.  Hyperlipidemia.  Continue atorvastatin.    COPD.  Good oxygenation presently at 98%.  Continue Advair and albuterol.  Continue 1 L oxygen as well.  On review of chart indicates CHF.  Clinically stable.  Continue torsemide and spironolactone.  We will add BNP to your labs today.  Follow-up in 3 to 4 weeks or sooner if needed.

## 2021-03-12 ENCOUNTER — Telehealth: Payer: Self-pay | Admitting: Medical

## 2021-03-12 NOTE — Telephone Encounter (Signed)
Left message for patient to call back and schedule Medicare Annual Wellness Visit (AWV) in office.  ° °If not able to come in office, please offer to do virtually or by telephone.  Left office number and my jabber #[phone]. ° °Due for AWVI ° °Please schedule at anytime with Nurse Health Advisor. °  °

## 2021-03-13 ENCOUNTER — Telehealth: Payer: Self-pay | Admitting: Cardiovascular Disease

## 2021-03-13 NOTE — Telephone Encounter (Signed)
Patient would like to change providers from Dr. Audie Box in [place] to Dr. Bettina Gavia in [facility] due to unavailability for driving

## 2021-03-16 ENCOUNTER — Ambulatory Visit: Payer: Medicare Other | Admitting: Physician Assistant

## 2021-03-19 ENCOUNTER — Other Ambulatory Visit: Payer: Self-pay | Admitting: Medical

## 2021-04-18 ENCOUNTER — Other Ambulatory Visit: Payer: Self-pay | Admitting: Medical

## 2021-05-17 ENCOUNTER — Other Ambulatory Visit: Payer: Self-pay | Admitting: Medical

## 2021-05-18 ENCOUNTER — Other Ambulatory Visit: Payer: Self-pay | Admitting: Medical

## 2021-08-13 ENCOUNTER — Telehealth: Payer: Self-pay | Admitting: Cardiovascular Disease

## 2021-08-13 ENCOUNTER — Ambulatory Visit (INDEPENDENT_AMBULATORY_CARE_PROVIDER_SITE_OTHER): Payer: Medicare Other | Admitting: Medical

## 2021-08-13 VITALS — BP 139/50 | HR 66 | Temp 98.2°F | Resp 18 | Ht 68.0 in | Wt 236.0 lb

## 2021-08-13 DIAGNOSIS — I5032 Chronic diastolic (congestive) heart failure: Secondary | ICD-10-CM

## 2021-08-13 DIAGNOSIS — E785 Hyperlipidemia, unspecified: Secondary | ICD-10-CM | POA: Diagnosis not present

## 2021-08-13 DIAGNOSIS — J432 Centrilobular emphysema: Secondary | ICD-10-CM | POA: Diagnosis not present

## 2021-08-13 DIAGNOSIS — E119 Type 2 diabetes mellitus without complications: Secondary | ICD-10-CM | POA: Diagnosis not present

## 2021-08-13 LAB — COMPREHENSIVE METABOLIC PANEL
ALT: 30 U/L (ref 0–53)
AST: 19 U/L (ref 0–37)
Albumin: 4.5 g/dL (ref 3.5–5.2)
Alkaline Phosphatase: 64 U/L (ref 39–117)
BUN: 11 mg/dL (ref 6–23)
CO2: 37 mEq/L — ABNORMAL HIGH (ref 19–32)
Calcium: 9.4 mg/dL (ref 8.4–10.5)
Chloride: 95 mEq/L — ABNORMAL LOW (ref 96–112)
Creatinine, Ser: 0.64 mg/dL (ref 0.40–1.50)
GFR: 98.29 mL/min (ref >60.00)
Glucose, Bld: 111 mg/dL — ABNORMAL HIGH (ref 70–99)
Potassium: 4.4 mEq/L (ref 3.5–5.1)
Sodium: 136 mEq/L (ref 135–145)
Total Bilirubin: 0.5 mg/dL (ref 0.2–1.2)
Total Protein: 7 g/dL (ref 6.0–8.3)

## 2021-08-13 LAB — HEMOGLOBIN A1C: Hgb A1c MFr Bld: 6.9 % — ABNORMAL HIGH (ref 4.6–6.5)

## 2021-08-13 LAB — BRAIN NATRIURETIC PEPTIDE: Pro B Natriuretic peptide (BNP): 23 pg/mL (ref 0.0–100.0)

## 2021-08-13 MED ORDER — METFORMIN HCL 1000 MG PO TABS: 1000.0000 mg | ORAL_TABLET | Freq: Two times a day (BID) | ORAL | 3 refills | Status: AC

## 2021-08-13 NOTE — Addendum Note (Signed)
Addended by: Anabel Halon on: 08/13/2021 09:30 PM ? ? Modules accepted: Orders ? ?

## 2021-08-13 NOTE — Telephone Encounter (Signed)
New Message: ? ? ? ?Patient would like to switch from Dr Audie Box to Dr Geraldo Pitter please. He wants to be seen in [facility], closer to his home. Is this alright with you both? ?

## 2021-08-13 NOTE — Patient Instructions (Addendum)
CHF with history of atrial flutter and hypertension.  Blood pressure well controlled today.  This is despite not being on a diuretic presently.  We will get BNP today.  Clinically stable no swelling legs.  When you get back home please take your diuretics.  Placed referral to cardiology in [facility].  Sometime since you have not seen a cardiologist and you missed follow-up with formally cardiologist.  So asked that you go up and down to the third floor and I will speak with cardiology receptionist.  Point out the referral I placed today.  With your rhythm history do think it is important for you to be evaluated and likely will be placed on Coumadin in light of the fact that Eliquis and Xarelto were too expensive and not covered by your insurance. ? ?Diabetes-we will get CMP and A1c today.  Hopefully your A1c improved from last time.  If not we will likely add on different medication to your metformin regimen presently. ? ?COPD-on review I did place referral for you to see a new pulmonologist but it looks like you are no-show.  I do think is a good idea for you to be followed by pulmonologist periodically in case needed.  Will place that referral again. ? ? ?Follow-up date 3 months or sooner if needed. ?

## 2021-08-13 NOTE — Progress Notes (Signed)
? ?Subjective:  ? ? Patient ID: Scott Jensen, male    DOB: [birth year], 66 y.o.   MRN: [id number] ? ?HPI ? ?Pt in for follow up.  ? ? ?5 months ago his a1c was 9.4. Pt last a1c in Nov 2022 was 9.4. Had asked to follow up in 3-4 weeks. Did not keep follow up. ? ? ?"Plan on 02-2021 result note was recommend increasing metformin to 1000 mg twice daily.  Will put in referral to diabetic educator.  Continue to check your blood sugars fasting in the morning and at least 1 postmeal sugar check.  If within a week your sugars are not less than 300 after eating we will consider adding medication called Invokana." ? ?Pt only checking sugars once once a month. When he checks sugars 100-115 fasting and sometimes after eating lunch. I had placed referral to diabetic educator but he did not attend. Pt did take the higher dose  metformin. ? ? ?Pt has chf and atrial flutter. Py on spirinolactone and demadex.  ?He was on xarelto briefly after eliquis was not covered. On last visit he had appt with cardilogist in 10 days. He did not keep that appointment. He states current cardiology practice is to far away. He states his insurance would not pay for xarelto either.  ? ?Htn- on bp meds but did not take his diuretics today. ? ? ?Copd- I had put in referral to pulmonologist. Looks like was no show ffor that appointment. Pt usually uses 1.5- to 2 L.  ? ? ?Review of Systems  ?Constitutional:  Negative for chills, fatigue and fever.  ?Respiratory:  Negative for cough, chest tightness, shortness of breath and wheezing.   ?Cardiovascular:  Negative for chest pain and palpitations.  ?Gastrointestinal:  Negative for abdominal pain and blood in stool.  ?Genitourinary:  Negative for dysuria, enuresis and flank pain.  ?Musculoskeletal:  Negative for back pain and joint swelling.  ?Skin:  Negative for rash.  ?Neurological:  Negative for dizziness, speech difficulty, light-headedness, numbness and headaches.  ?Psychiatric/Behavioral:  Negative for  behavioral problems, decreased concentration, dysphoric mood, hallucinations and suicidal ideas. The patient is not nervous/anxious.   ? ? ?Past Medical History:  ?Diagnosis Date  ? Cervical adenopathy   ? Steel plate C5-C6  ? Environmental allergies   ? History of chicken pox   ? Hyperglycemia   ? Borderline Daibetes  ? Hypertension   ? Increased pressure in the eye   ? Measles   ? Mumps   ? Seasonal allergies   ? ?  ?Social History  ? ?Socioeconomic History  ? Marital status: Divorced  ?  Spouse name: Not on file  ? Number of children: Not on file  ? Years of education: Not on file  ? Highest education level: Not on file  ?Occupational History  ? Not on file  ?Tobacco Use  ? Smoking status: Light Smoker  ?  Years: 3.00  ?  Types: Cigarettes  ? Smokeless tobacco: Never  ?Vaping Use  ? Vaping Use: Never used  ?Substance and Sexual Activity  ? Alcohol use: Yes  ?  Alcohol/week: 5.0 standard drinks  ?  Types: 5 Standard drinks or equivalent per week  ?  Comment: beer only  ? Drug use: No  ? Sexual activity: Yes  ?  Partners: Female  ?Other Topics Concern  ? Not on file  ?Social History Narrative  ? Not on file  ? ?Social Determinants of Health  ? ?Financial Resource Strain:  Not on file  ?Food Insecurity: Not on file  ?Transportation Needs: Not on file  ?Physical Activity: Not on file  ?Stress: Not on file  ?Social Connections: Not on file  ?Intimate Partner Violence: Not on file  ? ? ?Past Surgical History:  ?Procedure Laterality Date  ? CARDIOVERSION N/A 11/17/2020  ? Procedure: CARDIOVERSION;  Surgeon: Pixie Casino, MD;  Location: [facility] ENDOSCOPY;  Service: Cardiovascular;  Laterality: N/A;  ? Cervical Hardware Placement     ? CHOLECYSTECTOMY    ? COLONOSCOPY    ? Q5 yrs  ? RIGHT/LEFT HEART CATH AND CORONARY ANGIOGRAPHY N/A 11/16/2020  ? Procedure: RIGHT/LEFT HEART CATH AND CORONARY ANGIOGRAPHY;  Surgeon: Troy Sine, MD;  Location: [place] CV LAB;  Service: Cardiovascular;  Laterality: N/A;  ? TEE WITHOUT  CARDIOVERSION N/A 11/17/2020  ? Procedure: TRANSESOPHAGEAL ECHOCARDIOGRAM (TEE);  Surgeon: Pixie Casino, MD;  Location: [facility] ENDOSCOPY;  Service: Cardiovascular;  Laterality: N/A;  ? TONSILLECTOMY    ? WISDOM TOOTH EXTRACTION    ? ? ?Family History  ?Problem Relation Age of Onset  ? Brain cancer Mother 73  ?     Deceased  ? Liver cancer Mother   ?     Mets  ? Anxiety disorder Mother   ? Colon cancer Father 54  ?     Deceased  ? Alcoholism Father   ? Cancer Other   ?     Aunts & Uncles  ? Leukemia Sister   ? Leukemia Cousin   ? Hypertension Brother   ? Mental illness Brother   ? ? ?No Known Allergies ? ?Current Outpatient Medications on File Prior to Visit  ?Medication Sig Dispense Refill  ? albuterol (VENTOLIN HFA) 108 (90 Base) MCG/ACT inhaler Inhale 1-2 puffs into the lungs every 6 (six) hours as needed for wheezing or shortness of breath. 18 each 6  ? apixaban (ELIQUIS) 5 MG TABS tablet Take 1 tablet (5 mg total) by mouth 2 (two) times daily. 60 tablet 0  ? aspirin 81 MG EC tablet Take 1 tablet (81 mg total) by mouth daily. Swallow whole. 30 tablet 11  ? atorvastatin (LIPITOR) 80 MG tablet Take 1 tablet by mouth once daily 30 tablet 0  ? Fluticasone-Salmeterol (ADVAIR DISKUS) 250-50 MCG/DOSE AEPB Inhale 1 puff into the lungs 2 (two) times daily. 1 each 3  ? metFORMIN (GLUCOPHAGE) 1000 MG tablet Take 1 tablet (1,000 mg total) by mouth 2 (two) times daily with a meal. 180 tablet 3  ? spironolactone (ALDACTONE) 25 MG tablet TAKE 1 TABLET EVERY DAY 90 tablet 0  ? torsemide (DEMADEX) 20 MG tablet TAKE 1 TABLET EVERY DAY 90 tablet 0  ? ?No current facility-administered medications on file prior to visit.  ? ? ?BP (!) 139/50   Pulse 66   Temp 98.2 ?F (36.8 ?C)   Resp 18   Ht '5\' 8"'$  (1.727 m)   Wt 236 lb (107 kg)   SpO2 98%   BMI 35.88 kg/m?  ?  ?   ?Objective:  ? Physical Exam ? ?General ?Mental Status- Alert. General Appearance- Not in acute distress.  ? ?Skin ?General: Color- Normal Color. Moisture- Normal  Moisture. ? ?Neck ?Carotid Arteries- Normal color. Moisture- Normal Moisture. No carotid bruits. No JVD. ? ?Chest and Lung Exam ?Auscultation: ?Breath Sounds:-Normal. ? ?Cardiovascular ?Auscultation:Rythm- Regular. ?Murmurs & Other Heart Sounds:Auscultation of the heart reveals- No Murmurs. ? ?Abdomen ?Inspection:-Inspeection Normal. ?Palpation/Percussion:Note:No mass. Palpation and Percussion of the abdomen reveal- Non Tender, Non  Distended + BS, no rebound or guarding. ? ? ?Neurologic ?Cranial Nerve exam:- CN III-XII intact(No nystagmus), symmetric smile. ?Strength:- 5/5 equal and symmetric strength both upper and lower extremities.  ? ? ?   ?Assessment & Plan:  ? ?Patient Instructions  ?CHF with history of atrial flutter and hypertension.  Blood pressure well controlled today.  This is despite not being on a diuretic presently.  We will get BNP today.  Clinically he looks stable no swelling legs.  When you get back home please take your diuretics.  Placed referral to cardiology in [facility].  Sometime since you have not seen a cardiologist and you missed follow-up with formally cardiologist.  So asked that you go up and down to the third floor and I will speak with cardiology receptionist.  Point out the referral I placed today.  With your rhythm history do think it is important for you to be evaluated and likely will be placed on Coumadin in light of the fact that Eliquis and Xarelto were too expensive and not covered by your insurance. ? ?Diabetes-we will get CMP and A1c today.  Hopefully your A1c improved from last time.  If not we will likely add on different medication to your metformin regimen presently. ? ?COPD-on review I did place referral for you to see a new pulmonologist but it looks like you are no-show.  I do think is a good idea for you to be followed by pulmonologist periodically in case needed.  Will place that referral again. ? ? ?Follow-up date 3 months or sooner if needed.  ? ?Mackie Pai,  PA-C  ? ?Time spent with patient today was 40  minutes which consisted of chart revdiew, discussing diagnosis, work up treatment and documentation.  ?

## 2021-09-13 ENCOUNTER — Ambulatory Visit: Payer: Medicare Other | Admitting: Cardiology

## 2021-10-13 ENCOUNTER — Other Ambulatory Visit: Payer: Self-pay | Admitting: Medical

## 2021-10-31 ENCOUNTER — Ambulatory Visit: Payer: Medicare Other | Admitting: Cardiology

## 2021-11-26 ENCOUNTER — Ambulatory Visit: Payer: Medicare Other | Admitting: Cardiology

## 2021-12-11 ENCOUNTER — Ambulatory Visit (INDEPENDENT_AMBULATORY_CARE_PROVIDER_SITE_OTHER): Payer: Medicare Other | Admitting: Medical

## 2021-12-11 VITALS — BP 130/70 | HR 69 | Resp 18 | Ht 68.0 in | Wt 246.0 lb

## 2021-12-11 DIAGNOSIS — E785 Hyperlipidemia, unspecified: Secondary | ICD-10-CM

## 2021-12-11 DIAGNOSIS — I10 Essential (primary) hypertension: Secondary | ICD-10-CM | POA: Diagnosis not present

## 2021-12-11 DIAGNOSIS — I5032 Chronic diastolic (congestive) heart failure: Secondary | ICD-10-CM | POA: Diagnosis not present

## 2021-12-11 DIAGNOSIS — E119 Type 2 diabetes mellitus without complications: Secondary | ICD-10-CM

## 2021-12-11 LAB — LIPID PANEL
Cholesterol: 186 mg/dL (ref 0–200)
HDL: 38.4 mg/dL — ABNORMAL LOW (ref >39.00)
LDL Cholesterol: 120 mg/dL — ABNORMAL HIGH (ref 0–99)
NonHDL: 147.21
Total CHOL/HDL Ratio: 5
Triglycerides: 136 mg/dL (ref 0.0–149.0)
VLDL: 27.2 mg/dL (ref 0.0–40.0)

## 2021-12-11 LAB — COMPREHENSIVE METABOLIC PANEL
ALT: 27 U/L (ref 0–53)
AST: 19 U/L (ref 0–37)
Albumin: 4.7 g/dL (ref 3.5–5.2)
Alkaline Phosphatase: 57 U/L (ref 39–117)
BUN: 13 mg/dL (ref 6–23)
CO2: 34 mEq/L — ABNORMAL HIGH (ref 19–32)
Calcium: 9.7 mg/dL (ref 8.4–10.5)
Chloride: 96 mEq/L (ref 96–112)
Creatinine, Ser: 0.69 mg/dL (ref 0.40–1.50)
GFR: 95.86 mL/min (ref >60.00)
Glucose, Bld: 126 mg/dL — ABNORMAL HIGH (ref 70–99)
Potassium: 4.5 mEq/L (ref 3.5–5.1)
Sodium: 136 mEq/L (ref 135–145)
Total Bilirubin: 0.8 mg/dL (ref 0.2–1.2)
Total Protein: 7 g/dL (ref 6.0–8.3)

## 2021-12-11 LAB — BRAIN NATRIURETIC PEPTIDE: Pro B Natriuretic peptide (BNP): 34 pg/mL (ref 0.0–100.0)

## 2021-12-11 LAB — HEMOGLOBIN A1C: Hgb A1c MFr Bld: 7.2 % — ABNORMAL HIGH (ref 4.6–6.5)

## 2021-12-11 NOTE — Patient Instructions (Addendum)
Chf- continue sprinolactone and torsemide.  Clinically stable.  We will get BNP today.  Mali score 5 and prior on eliquis 5 mg twice daily.  2 weeks samples given today  Also gave 30-day trial coupon.  You have upcoming appointment with cardiologist in 2 weeks ago.  We will get their opinion whether you can continue Eliquis for the switch to Coumadin.  You mention cardiology appointment canceled various times.  Please let me know immediately if you do not go to your next cardiologist appointment.  Hypertension-well controlled. Atrial fibrillation-rate controlled presently.  Diabetes with last A1c of 6.9.  I will get CMP and A1c today.  Hyperlipidemia get lipid panel.  Continue atorvastatin.  Follow-up in 1 month or sooner if needed.

## 2021-12-11 NOTE — Progress Notes (Signed)
Subjective:    Patient ID: Scott Jensen, male    DOB: 08/08/1955, 66 y.o.   MRN: [id number]  HPI  CHF- pt updates me he has not seen cardiologist yet. He states 2 appointments have been cancelled. He states December 26, 2021 is date of his appointment.  Pt is on sprinolactone and torsemide.   CHA2DS2-VASc Score = 5    This indicates a 7.2% annual risk of stroke. The patient's score is based upon: CHF History: Yes HTN History: Yes Diabetes History: Yes Stroke History: No Vascular Disease History: Yes Age Score: 1 Gender Score: 0    Diabetes- pt last a1c was 6.9. Pt states his sugar was 103 recently around lunch time.    Copd-  pt is on advair 1 puff twice daily. No labored breathing.     Review of Systems  Constitutional:  Negative for chills and fatigue.  HENT:  Negative for congestion, ear discharge and ear pain.   Respiratory:  Negative for cough, choking, shortness of breath and wheezing.   Cardiovascular:  Negative for chest pain and palpitations.  Gastrointestinal:  Negative for abdominal pain and blood in stool.  Genitourinary:  Negative for dysuria, flank pain and hematuria.  Musculoskeletal:  Negative for back pain.  Skin:  Negative for rash.  Neurological:  Negative for seizures, syncope, facial asymmetry, weakness, light-headedness and numbness.    Past Medical History:  Diagnosis Date   Cervical adenopathy    Steel plate C5-C6   Environmental allergies    History of chicken pox    Hyperglycemia    Borderline Daibetes   Hypertension    Increased pressure in the eye    Measles    Mumps    Seasonal allergies      Social History   Socioeconomic History   Marital status: Divorced    Spouse name: Not on file   Number of children: Not on file   Years of education: Not on file   Highest education level: Not on file  Occupational History   Not on file  Tobacco Use   Smoking status: Light Smoker    Years: 3.00    Types: Cigarettes   Smokeless  tobacco: Never  Vaping Use   Vaping Use: Never used  Substance and Sexual Activity   Alcohol use: Yes    Alcohol/week: 5.0 standard drinks of alcohol    Types: 5 Standard drinks or equivalent per week    Comment: beer only   Drug use: No   Sexual activity: Yes    Partners: Female  Other Topics Concern   Not on file  Social History Narrative   Not on file   Social Determinants of Health   Financial Resource Strain: Not on file  Food Insecurity: Not on file  Transportation Needs: Not on file  Physical Activity: Not on file  Stress: Not on file  Social Connections: Not on file  Intimate Partner Violence: Not on file    Past Surgical History:  Procedure Laterality Date   CARDIOVERSION N/A 11/17/2020   Procedure: CARDIOVERSION;  Surgeon: Pixie Casino, MD;  Location: [place];  Service: Cardiovascular;  Laterality: N/A;   Cervical Hardware Placement      CHOLECYSTECTOMY     COLONOSCOPY     Q5 yrs   RIGHT/LEFT HEART CATH AND CORONARY ANGIOGRAPHY N/A 11/16/2020   Procedure: RIGHT/LEFT HEART CATH AND CORONARY ANGIOGRAPHY;  Surgeon: Troy Sine, MD;  Location: [place] CV LAB;  Service: Cardiovascular;  Laterality: N/A;  TEE WITHOUT CARDIOVERSION N/A 11/17/2020   Procedure: TRANSESOPHAGEAL ECHOCARDIOGRAM (TEE);  Surgeon: Pixie Casino, MD;  Location: [facility] ENDOSCOPY;  Service: Cardiovascular;  Laterality: N/A;   TONSILLECTOMY     WISDOM TOOTH EXTRACTION      Family History  Problem Relation Age of Onset   Brain cancer Mother 31       Deceased   Liver cancer Mother        Mets   Anxiety disorder Mother    Colon cancer Father 104       Deceased   Alcoholism Father    Cancer Other        Aunts & Uncles   Leukemia Sister    Leukemia Cousin    Hypertension Brother    Mental illness Brother     No Known Allergies  Current Outpatient Medications on File Prior to Visit  Medication Sig Dispense Refill   albuterol (VENTOLIN HFA) 108 (90 Base) MCG/ACT inhaler  Inhale 1-2 puffs into the lungs every 6 (six) hours as needed for wheezing or shortness of breath. 18 each 6   apixaban (ELIQUIS) 5 MG TABS tablet Take 1 tablet (5 mg total) by mouth 2 (two) times daily. 60 tablet 0   aspirin 81 MG EC tablet Take 1 tablet (81 mg total) by mouth daily. Swallow whole. 30 tablet 11   atorvastatin (LIPITOR) 80 MG tablet Take 1 tablet by mouth once daily 30 tablet 0   Fluticasone-Salmeterol (ADVAIR DISKUS) 250-50 MCG/DOSE AEPB Inhale 1 puff into the lungs 2 (two) times daily. 1 each 3   metFORMIN (GLUCOPHAGE) 1000 MG tablet Take 1 tablet (1,000 mg total) by mouth 2 (two) times daily with a meal. 180 tablet 3   spironolactone (ALDACTONE) 25 MG tablet TAKE 1 TABLET EVERY DAY 90 tablet 0   torsemide (DEMADEX) 20 MG tablet TAKE 1 TABLET EVERY DAY 90 tablet 0   No current facility-administered medications on file prior to visit.    BP 130/70   Pulse 69   Resp 18   Ht '5\' 8"'$  (1.727 m)   Wt 246 lb (111.6 kg)   SpO2 96%   BMI 37.40 kg/m        Objective:   Physical Exam  General Mental Status- Alert. General Appearance- Not in acute distress.   Skin General: Color- Normal Color. Moisture- Normal Moisture.  Neck Carotid Arteries- Normal color. Moisture- Normal Moisture. No carotid bruits. No JVD.  Chest and Lung Exam Auscultation: Breath Sounds:-Normal.  Cardiovascular Auscultation:Rythm- Regular. Murmurs & Other Heart Sounds:Auscultation of the heart reveals- No Murmurs.  Abdomen Inspection:-Inspeection Normal. Palpation/Percussion:Note:No mass. Palpation and Percussion of the abdomen reveal- Non Tender, Non Distended + BS, no rebound or guarding.   Neurologic Cranial Nerve exam:- CN III-XII intact(No nystagmus), symmetric smile. Strength:- 5/5 equal and symmetric strength both upper and lower extremities.   Lower ext- no pedal edema. Negative homans signs.     Assessment & Plan:   Patient Instructions  Chf- continue sprinolactone and  torsemide.  Clinically stable.  We will get BNP today.  Mali score 5 and prior on eliquis 5 mg twice daily.  2 weeks samples given today  Also gave 30-day trial coupon.  You have upcoming appointment with cardiologist in 2 weeks ago.  We will get their opinion whether you can continue Eliquis for the switch to Coumadin.  You mention cardiology appointment canceled various times.  Please let me know immediately if you do not go to your next cardiologist appointment.  Hypertension-well controlled. Atrial fibrillation-rate controlled presently.  Diabetes with last A1c of 6.9.  I will get CMP and A1c today.  Hyperlipidemia get lipid panel.  Continue atorvastatin.  Follow-up in 1 month or sooner if needed.    Mackie Pai, PA-C

## 2021-12-25 DIAGNOSIS — Z9109 Other allergy status, other than to drugs and biological substances: Secondary | ICD-10-CM | POA: Insufficient documentation

## 2021-12-25 DIAGNOSIS — H40059 Ocular hypertension, unspecified eye: Secondary | ICD-10-CM | POA: Insufficient documentation

## 2021-12-25 DIAGNOSIS — B059 Measles without complication: Secondary | ICD-10-CM | POA: Insufficient documentation

## 2021-12-25 DIAGNOSIS — I10 Essential (primary) hypertension: Secondary | ICD-10-CM | POA: Insufficient documentation

## 2021-12-25 DIAGNOSIS — J302 Other seasonal allergic rhinitis: Secondary | ICD-10-CM | POA: Insufficient documentation

## 2021-12-25 DIAGNOSIS — R739 Hyperglycemia, unspecified: Secondary | ICD-10-CM | POA: Insufficient documentation

## 2021-12-25 DIAGNOSIS — R590 Localized enlarged lymph nodes: Secondary | ICD-10-CM | POA: Insufficient documentation

## 2021-12-25 DIAGNOSIS — Z8619 Personal history of other infectious and parasitic diseases: Secondary | ICD-10-CM | POA: Insufficient documentation

## 2021-12-25 DIAGNOSIS — B269 Mumps without complication: Secondary | ICD-10-CM | POA: Insufficient documentation

## 2021-12-26 ENCOUNTER — Ambulatory Visit: Payer: Medicare Other | Attending: Cardiology | Admitting: Cardiology

## 2021-12-26 ENCOUNTER — Encounter: Payer: Self-pay | Admitting: Cardiology

## 2021-12-26 ENCOUNTER — Telehealth: Payer: Self-pay

## 2021-12-26 VITALS — BP 172/82 | HR 74 | Ht 69.6 in | Wt 238.1 lb

## 2021-12-26 DIAGNOSIS — E782 Mixed hyperlipidemia: Secondary | ICD-10-CM

## 2021-12-26 DIAGNOSIS — Z720 Tobacco use: Secondary | ICD-10-CM | POA: Diagnosis present

## 2021-12-26 DIAGNOSIS — I2510 Atherosclerotic heart disease of native coronary artery without angina pectoris: Secondary | ICD-10-CM | POA: Insufficient documentation

## 2021-12-26 DIAGNOSIS — I10 Essential (primary) hypertension: Secondary | ICD-10-CM

## 2021-12-26 DIAGNOSIS — I4892 Unspecified atrial flutter: Secondary | ICD-10-CM

## 2021-12-26 NOTE — Telephone Encounter (Signed)
Received message from Dr Julien Nordmann nurse Reuben Likes, RN stating pt saw provider this am. Per note, Dr Gracy Racer discussed anticoagulation benefits and potential risks.   Per note -  Patient is not on anticoagulation.  He tells me that he ran out of samples and cannot afford Eliquis.  I suggested Coumadin and he tells me that he will think about it.  Risks of not being on medications explained and he vocalized understanding and questions were answered to his satisfaction.  We told him about warfarin in the Coumadin clinic and self monitoring and he is going to think about it and get in touch with her warfarin clinic.  I called pt and discussed starting Warfarin and how Coumadin Clinic manages dosage and INR. Pt verbalized understanding; however, he wishes to think about the new medication and will call back. I provided pt with direct Coumadin Clinic number. Pt was very appreciative and stated he would call back .

## 2021-12-26 NOTE — Patient Instructions (Signed)
Medication Instructions:  Your physician recommends that you continue on your current medications as directed. Please refer to the Current Medication list given to you today.  *If you need a refill on your cardiac medications before your next appointment, please call your pharmacy*   Lab Work: Your physician recommends that you have labs done in the office today. This will be for a basic metabolic panel.   [facility] [address] 2nd floor M-W 8-11:30 and 1-4:30 and Thursday and Friday 8-11:30.   If you have labs (blood work) drawn today and your tests are completely normal, you will receive your results only by: [place] (if you have MyChart) OR A paper copy in the mail If you have any lab test that is abnormal or we need to change your treatment, we will call you to review the results.   Testing/Procedures: Your physician has requested that you have an echocardiogram. Echocardiography is a painless test that uses sound waves to create images of your heart. It provides your doctor with information about the size and shape of your heart and how well your heart's chambers and valves are working. This procedure takes approximately one hour. There are no restrictions for this procedure.    Follow-Up: At [facility], you and your health needs are our priority.  As part of our continuing mission to provide you with exceptional heart care, we have created designated Provider Care Teams.  These Care Teams include your primary Cardiologist (physician) and Advanced Practice Providers (APPs -  Physician Assistants and Nurse Practitioners) who all work together to provide you with the care you need, when you need it.  We recommend signing up for the patient portal called "MyChart".  Sign up information is provided on this After Visit Summary.  MyChart is used to connect with patients for Virtual Visits (Telemedicine).  Patients are able to view lab/test results, encounter notes, upcoming  appointments, etc.  Non-urgent messages can be sent to your provider as well.   To learn more about what you can do with MyChart, go to [email or web address].    Your next appointment:   9 month(s)  The format for your next appointment:   In Person  Provider:   Jyl Heinz, MD   Other Instructions Echocardiogram An echocardiogram is a test that uses sound waves (ultrasound) to produce images of the heart. Images from an echocardiogram can provide important information about: Heart size and shape. The size and thickness and movement of your heart's walls. Heart muscle function and strength. Heart valve function or if you have stenosis. Stenosis is when the heart valves are too narrow. If blood is flowing backward through the heart valves (regurgitation). A tumor or infectious growth around the heart valves. Areas of heart muscle that are not working well because of poor blood flow or injury from a heart attack. Aneurysm detection. An aneurysm is a weak or damaged part of an artery wall. The wall bulges out from the normal force of blood pumping through the body. Tell a health care provider about: Any allergies you have. All medicines you are taking, including vitamins, herbs, eye drops, creams, and over-the-counter medicines. Any blood disorders you have. Any surgeries you have had. Any medical conditions you have. Whether you are pregnant or may be pregnant. What are the risks? Generally, this is a safe test. However, problems may occur, including an allergic reaction to dye (contrast) that may be used during the test. What happens before the test? No specific preparation  is needed. You may eat and drink normally. What happens during the test? You will take off your clothes from the waist up and put on a hospital gown. Electrodes or electrocardiogram (ECG)patches may be placed on your chest. The electrodes or patches are then connected to a device that monitors your heart  rate and rhythm. You will lie down on a table for an ultrasound exam. A gel will be applied to your chest to help sound waves pass through your skin. A handheld device, called a transducer, will be pressed against your chest and moved over your heart. The transducer produces sound waves that travel to your heart and bounce back (or "echo" back) to the transducer. These sound waves will be captured in real-time and changed into images of your heart that can be viewed on a video monitor. The images will be recorded on a computer and reviewed by your health care provider. You may be asked to change positions or hold your breath for a short time. This makes it easier to get different views or better views of your heart. In some cases, you may receive contrast through an IV in one of your veins. This can improve the quality of the pictures from your heart. The procedure may vary among health care providers and hospitals.   What can I expect after the test? You may return to your normal, everyday life, including diet, activities, and medicines, unless your health care provider tells you not to do that. Follow these instructions at home: It is up to you to get the results of your test. Ask your health care provider, or the department that is doing the test, when your results will be ready. Keep all follow-up visits. This is important. Summary An echocardiogram is a test that uses sound waves (ultrasound) to produce images of the heart. Images from an echocardiogram can provide important information about the size and shape of your heart, heart muscle function, heart valve function, and other possible heart problems. You do not need to do anything to prepare before this test. You may eat and drink normally. After the echocardiogram is completed, you may return to your normal, everyday life, unless your health care provider tells you not to do that. This information is not intended to replace advice given to  you by your health care provider. Make sure you discuss any questions you have with your health care provider. Document Revised: 12/07/2019 Document Reviewed: 12/07/2019 Elsevier Patient Education  2021 [employer].

## 2021-12-26 NOTE — Progress Notes (Signed)
Cardiology Office Note:    Date:  12/26/2021   ID:  Scott Jensen, DOB 1955/05/08, MRN [id number]  PCP:  Mackie Pai, PA-C  Cardiologist:  Jenean Lindau, MD   Referring MD: Mackie Pai, PA-C    ASSESSMENT:    1. Coronary artery disease involving native coronary artery of native heart without angina pectoris   2. Atrial flutter, unspecified type ([place])   3. Mixed hyperlipidemia   4. Primary hypertension    PLAN:    In order of problems listed above:  Coronary artery disease: Secondary prevention stressed with patient.  Importance of compliance with diet and medication stressed and vocalized understanding. Atrial flutter: Now in sinus rhythm.I discussed with the patient atrial flutter disease process. Management and therapy including rate and rhythm control, anticoagulation benefits and potential risks were discussed extensively with the patient. Patient had multiple questions which were answered to patient's satisfaction.  Patient is not on anticoagulation.  He tells me that he ran out of samples and cannot afford Eliquis.  I suggested Coumadin and he tells me that he will think about it.  Risks of not being on medications explained and he vocalized understanding and questions were answered to his satisfaction.  We told him about warfarin in the Coumadin clinic and self monitoring and he is going to think about it and get in touch with her warfarin clinic. Mixed dyslipidemia: On lipid-lowering medications.  We will check his blood work today. Obesity: Weight reduction stressed risks of obesity explained and he promises to do better. Cigarette smoker: I spent 5 minutes with the patient discussing solely about smoking. Smoking cessation was counseled. I suggested to the patient also different medications and pharmacological interventions. Patient is keen to try stopping on its own at this time. He will get back to me if he needs any further assistance in this matter. Essential  hypertension: He has not taken his medications today.  He mentions blood pressures at home and they are fine.  Lifestyle modification and salt intake issues were discussed. Patient will be seen in follow-up appointment in 6 months or earlier if the patient has any concerns     Medication Adjustments/Labs and Tests Ordered: Current medicines are reviewed at length with the patient today.  Concerns regarding medicines are outlined above.  No orders of the defined types were placed in this encounter.  No orders of the defined types were placed in this encounter.    No chief complaint on file.    History of Present Illness:    Scott Jensen is a 66 y.o. male.  Patient has past medical history of coronary artery disease, essential hypertension, dyslipidemia and diabetes mellitus.  He denies any problems at this time and takes care of activities of daily living.  He has COPD on oxygen and uses oxygen.  Unfortunately he has restarted smoking.  At the time of my evaluation, the patient is alert awake oriented and in no distress.  There is history of pulmonary hypertension.  The patient has transferred his care from my colleague to me so I reviewed his chart.  Past Medical History:  Diagnosis Date   Acute respiratory failure with hypoxia (HCC) 11/13/2020   Atrial flutter (HCC)    CAD (coronary artery disease), native coronary artery 12/07/2016   Coronary calcification noted on previous CT scan   Cardiomegaly 06/07/2015   Centrilobular emphysema ([place]) 11/06/2018   Formatting of this note might be different from the original. CTA 07/23/18   Cervical adenopathy  Steel plate C5-C6   Chest pain 12/07/2016   Chronic diastolic CHF (congestive heart failure) ([place]) 11/13/2020   COPD with acute exacerbation ([place]) 12/06/2016   Environmental allergies    History of chicken pox    Hyperglycemia    Borderline Daibetes   Hyperlipidemia 11/05/2016   Hypertension    Hypertensive heart disease without CHF  06/07/2015   Increased pressure in the eye    Lipoma of arm 05/31/2015   Measles    Mumps    OSA (obstructive sleep apnea) 12/07/2016   Other cirrhosis of liver ([place]) 11/13/2020   Seasonal allergies    Tachycardia 12/07/2016   Tobacco abuse disorder 05/22/2014    Past Surgical History:  Procedure Laterality Date   CARDIOVERSION N/A 11/17/2020   Procedure: CARDIOVERSION;  Surgeon: Pixie Casino, MD;  Location: [facility];  Service: Cardiovascular;  Laterality: N/A;   Cervical Hardware Placement      CHOLECYSTECTOMY     COLONOSCOPY     Q5 yrs   RIGHT/LEFT HEART CATH AND CORONARY ANGIOGRAPHY N/A 11/16/2020   Procedure: RIGHT/LEFT HEART CATH AND CORONARY ANGIOGRAPHY;  Surgeon: Troy Sine, MD;  Location: [facility];  Service: Cardiovascular;  Laterality: N/A;   TEE WITHOUT CARDIOVERSION N/A 11/17/2020   Procedure: TRANSESOPHAGEAL ECHOCARDIOGRAM (TEE);  Surgeon: Pixie Casino, MD;  Location: [facility] ENDOSCOPY;  Service: Cardiovascular;  Laterality: N/A;   TONSILLECTOMY     WISDOM TOOTH EXTRACTION      Current Medications: Current Meds  Medication Sig   albuterol (VENTOLIN HFA) 108 (90 Base) MCG/ACT inhaler Inhale 1-2 puffs into the lungs every 6 (six) hours as needed for wheezing or shortness of breath.   aspirin 81 MG EC tablet Take 1 tablet (81 mg total) by mouth daily. Swallow whole.   atorvastatin (LIPITOR) 80 MG tablet Take 1 tablet by mouth once daily   Fluticasone-Salmeterol (ADVAIR DISKUS) 250-50 MCG/DOSE AEPB Inhale 1 puff into the lungs 2 (two) times daily.   metFORMIN (GLUCOPHAGE) 1000 MG tablet Take 1 tablet (1,000 mg total) by mouth 2 (two) times daily with a meal.   spironolactone (ALDACTONE) 25 MG tablet TAKE 1 TABLET EVERY DAY   torsemide (DEMADEX) 20 MG tablet TAKE 1 TABLET EVERY DAY     Allergies:   Patient has no known allergies.   Social History   Socioeconomic History   Marital status: Divorced    Spouse name: Not on file   Number of children: Not on  file   Years of education: Not on file   Highest education level: Not on file  Occupational History   Not on file  Tobacco Use   Smoking status: Light Smoker    Years: 3.00    Types: Cigarettes   Smokeless tobacco: Never  Vaping Use   Vaping Use: Never used  Substance and Sexual Activity   Alcohol use: Yes    Alcohol/week: 5.0 standard drinks of alcohol    Types: 5 Standard drinks or equivalent per week    Comment: beer only   Drug use: No   Sexual activity: Yes    Partners: Female  Other Topics Concern   Not on file  Social History Narrative   Not on file   Social Determinants of Health   Financial Resource Strain: Not on file  Food Insecurity: Not on file  Transportation Needs: Not on file  Physical Activity: Not on file  Stress: Not on file  Social Connections: Not on file     Family  History: The patient's family history includes Alcoholism in his father; Anxiety disorder in his mother; Brain cancer (age of onset: 87) in his mother; Cancer in an other family member; Colon cancer (age of onset: 5) in his father; Hypertension in his brother; Leukemia in his cousin and sister; Liver cancer in his mother; Mental illness in his brother.  ROS:   Please see the history of present illness.    All other systems reviewed and are negative.  EKGs/Labs/Other Studies Reviewed:    The following studies were reviewed today: EKG reveals sinus rhythm and nonspecific ST-T changes.   Recent Labs: 03/06/2021: Hemoglobin 13.3; Platelets 157.0 12/11/2021: ALT 27; BUN 13; Creatinine, Ser 0.69; Potassium 4.5; Pro B Natriuretic peptide (BNP) 34.0; Sodium 136  Recent Lipid Panel    Component Value Date/Time   CHOL 186 12/11/2021 0920   TRIG 136.0 12/11/2021 0920   HDL 38.40 (L) 12/11/2021 0920   CHOLHDL 5 12/11/2021 0920   VLDL 27.2 12/11/2021 0920   LDLCALC 120 (H) 12/11/2021 0920    Physical Exam:    VS:  BP (!) 172/82   Pulse 74   Ht 5' 9.6" (1.768 m)   Wt 238 lb 1.3 oz  (108 kg)   SpO2 94%   BMI 34.55 kg/m     Wt Readings from Last 3 Encounters:  12/26/21 238 lb 1.3 oz (108 kg)  12/11/21 246 lb (111.6 kg)  08/13/21 236 lb (107 kg)     GEN: Patient is in no acute distress HEENT: Normal NECK: No JVD; No carotid bruits LYMPHATICS: No lymphadenopathy CARDIAC: Hear sounds regular, 2/6 systolic murmur at the apex. RESPIRATORY:  Clear to auscultation without rales, wheezing or rhonchi  ABDOMEN: Soft, non-tender, non-distended MUSCULOSKELETAL:  No edema; No deformity  SKIN: Warm and dry NEUROLOGIC:  Alert and oriented x 3 PSYCHIATRIC:  Normal affect   Signed, Jenean Lindau, MD  12/26/2021 10:35 AM    [place]

## 2021-12-27 LAB — BASIC METABOLIC PANEL
BUN/Creatinine Ratio: 10 (ref 10–24)
BUN: 7 mg/dL — ABNORMAL LOW (ref 8–27)
CO2: 26 mmol/L (ref 20–29)
Calcium: 9.7 mg/dL (ref 8.6–10.2)
Chloride: 100 mmol/L (ref 96–106)
Creatinine, Ser: 0.72 mg/dL — ABNORMAL LOW (ref 0.76–1.27)
Glucose: 125 mg/dL — ABNORMAL HIGH (ref 70–99)
Potassium: 4.7 mmol/L (ref 3.5–5.2)
Sodium: 139 mmol/L (ref 134–144)
eGFR: 100 mL/min/{1.73_m2} (ref >59)

## 2021-12-28 NOTE — Telephone Encounter (Signed)
Called pt to follow-up with his decision to switch to Warfarin. Pt stated he would like to continue taking Eliquis at this time. Educated pt on the importance of taking his Eliquis or contacting us anytime he would like to switch to Warfarin. Provided pt with direct Coumadin Clinic number.

## 2022-01-29 ENCOUNTER — Ambulatory Visit (HOSPITAL_BASED_OUTPATIENT_CLINIC_OR_DEPARTMENT_OTHER)
Admission: RE | Admit: 2022-01-29 | Discharge: 2022-01-29 | Disposition: A | Discharge status: Home / Self Care | Payer: Medicare Other | Source: Ambulatory Visit | Attending: Cardiology | Admitting: Cardiology

## 2022-01-29 DIAGNOSIS — I2510 Atherosclerotic heart disease of native coronary artery without angina pectoris: Secondary | ICD-10-CM

## 2022-01-29 DIAGNOSIS — I4892 Unspecified atrial flutter: Secondary | ICD-10-CM

## 2022-01-29 LAB — ECHOCARDIOGRAM COMPLETE
AR max vel: 2.12 cm2
AV Area VTI: 2.12 cm2
AV Area mean vel: 2.12 cm2
AV Mean grad: 8 mmHg
AV Peak grad: 17.1 mmHg
Ao pk vel: 2.07 m/s
Area-P 1/2: 3.91 cm2
P 1/2 time: 477 msec
S' Lateral: 3.5 cm

## 2022-01-29 NOTE — Progress Notes (Signed)
  Echocardiogram 2D Echocardiogram has been performed.  Scott Jensen F 01/29/2022, 9:41 AM

## 2022-01-30 IMAGING — CT CT ANGIO CHEST-ABD-PELV FOR DISSECTION W/ AND WO/W CM
2 of 9 series · 14 of 46 positions shown, 16 images · non-contrast
Comparison: CT angiography chest 07/23/2018

CLINICAL DATA: Chest pain radiating to the neck. Suspect aortic
dissection.

EXAM:
CT ANGIOGRAPHY CHEST, ABDOMEN AND PELVIS
TECHNIQUE: Non-contrast CT of the chest was initially obtained.

[Series 6: axial arterial · axial · arterial · 0.98mm/px · z∈[-732,-90]mm · 11 of 240 slices shown, 13 images]
[im 13/240  soft-tissue]
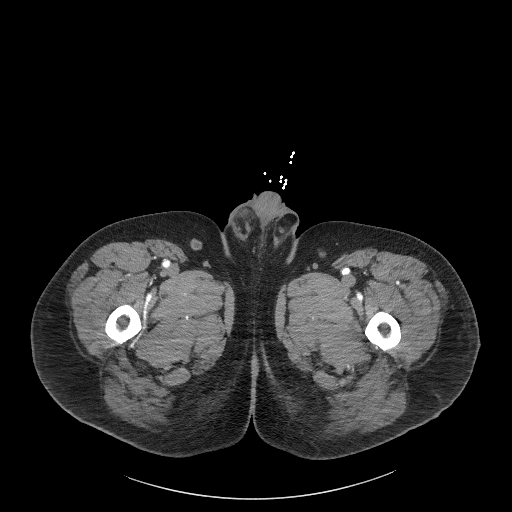
[im 13/240  bone]
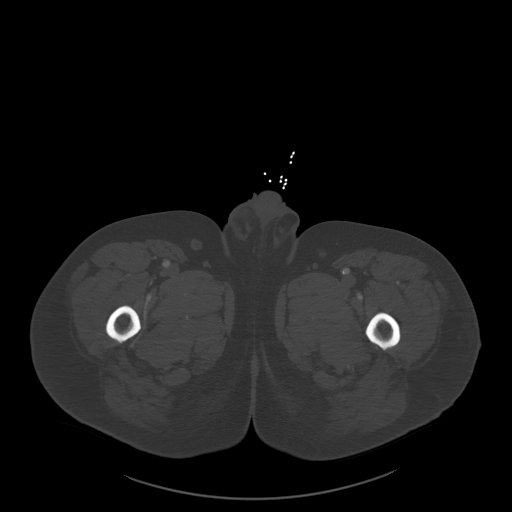
[im 38/240  soft-tissue]
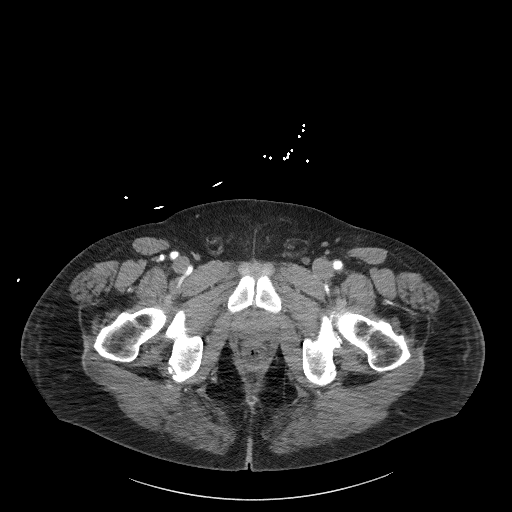
[im 63/240  soft-tissue]
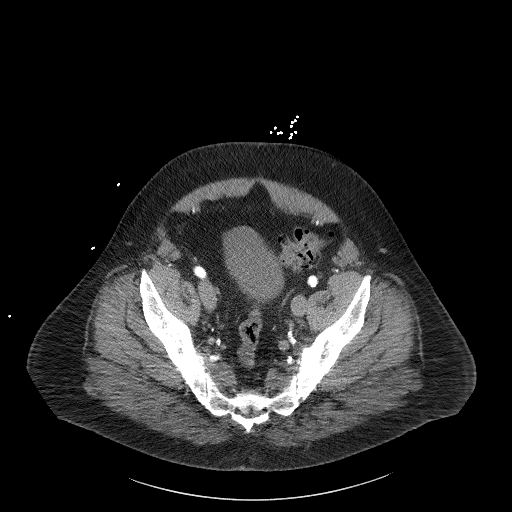
[im 76/240  soft-tissue]
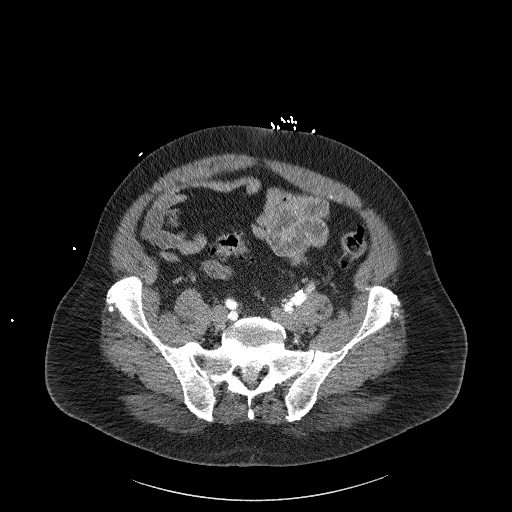
[im 101/240  soft-tissue]
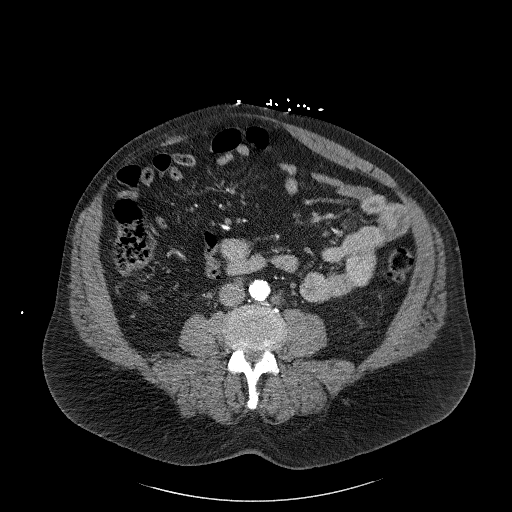
[im 126/240  soft-tissue]
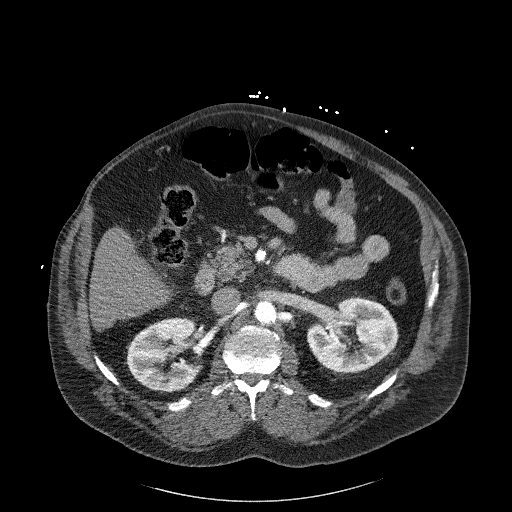
[im 139/240  soft-tissue]
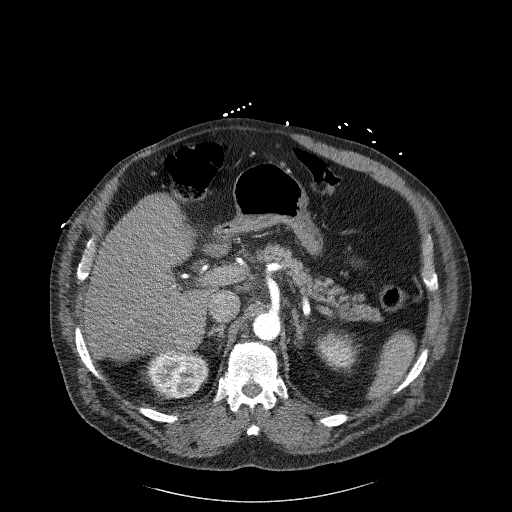
[im 164/240  soft-tissue]
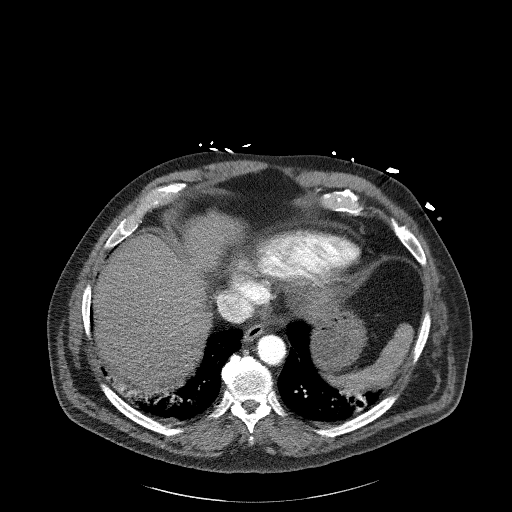
[im 177/240  soft-tissue]
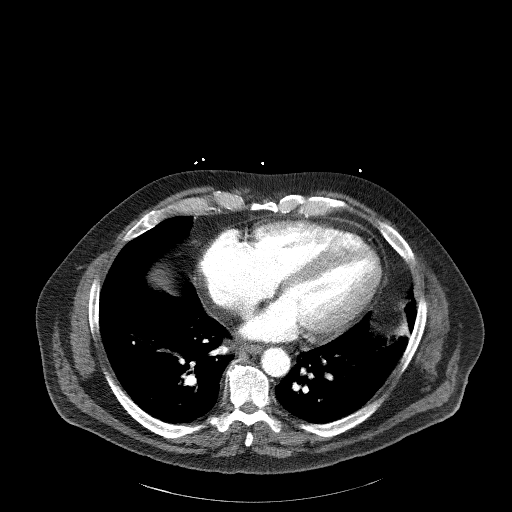
[im 177/240  bone]
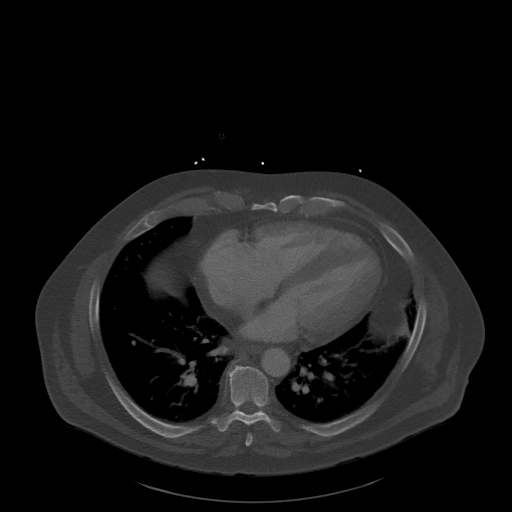
[im 202/240  soft-tissue]
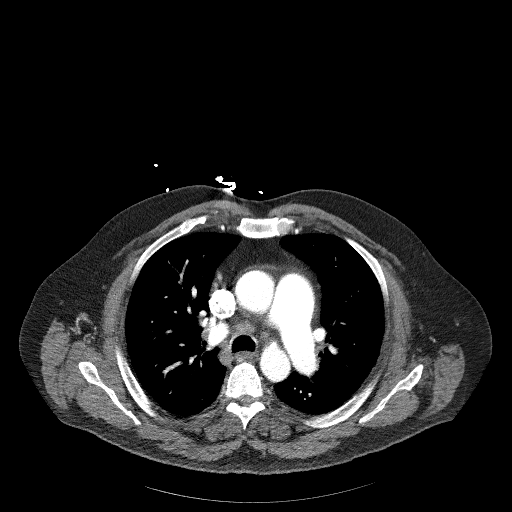
[im 227/240  soft-tissue]
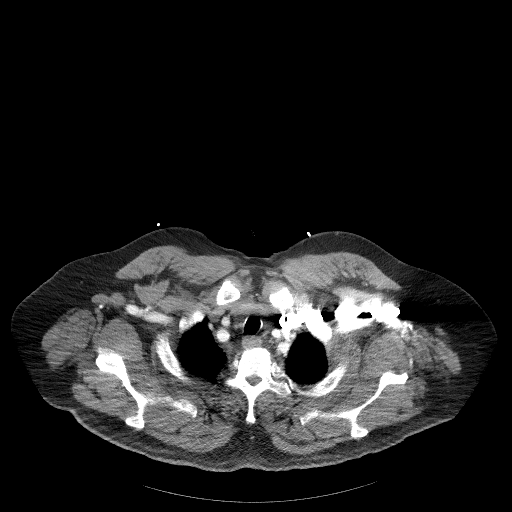

[Series 9: coronals · coronal · 0.89mm/px · 3 of 184 slices shown]
[im 46/184  soft-tissue]
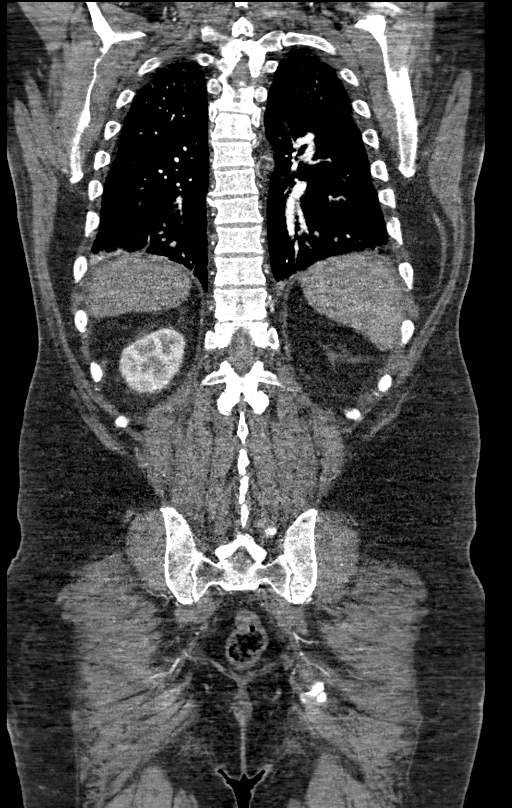
[im 92/184  soft-tissue]
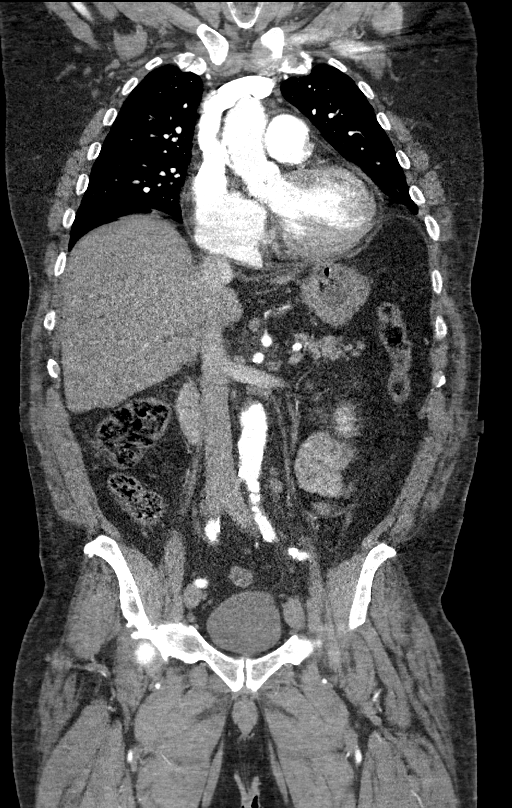
[im 138/184  soft-tissue]
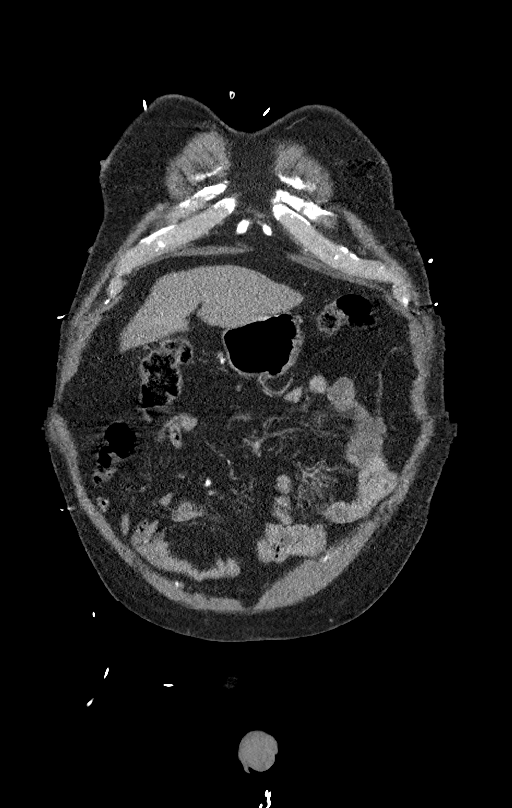

[14 of 46 positions shown; findings below may reference images not displayed]

Multidetector CT imaging through the chest, abdomen and pelvis was
performed using the standard protocol during bolus administration of
intravenous contrast. Multiplanar reconstructed images and MIPs were
obtained and reviewed to evaluate the vascular anatomy.

CONTRAST:  100mL OMNIPAQUE IOHEXOL 350 MG/ML SOLN
FINDINGS: CTA CHEST FINDINGS

Cardiovascular: Heart size is within normal limits. Extensive
coronary calcifications. No aortic intramural hematoma identified on
noncontrast images. Thoracic aorta is normal in caliber. No
significant stenosis.

Mediastinum/Nodes: Nonenlarged lymph nodes seen throughout the
mediastinum. No enlarged axillary or hilar lymph nodes.

Lungs/Pleura: Lungs are clear. No pleural effusion or pneumothorax.

Musculoskeletal: No chest wall abnormality. No acute or significant
osseous findings.

Review of the MIP images confirms the above findings.

CTA ABDOMEN AND PELVIS FINDINGS

VASCULAR

Aorta: Scattered atheromatous plaque without aneurysmal dilatation
or significant stenosis.

Celiac: Patent without evidence of aneurysm, dissection, vasculitis
or significant stenosis.

SMA: Patent without evidence of aneurysm, dissection, vasculitis or
significant stenosis.

Renals: Both renal arteries are patent without evidence of aneurysm,
dissection, vasculitis, fibromuscular dysplasia or significant
stenosis.

IMA: Patent.

Inflow: Scattered calcified atheromatous plaque of the common,
internal common external iliac arteries without high-grade stenosis.

Visualized outflow: Non flow-limiting dissection of the right
proximal superficial femoral artery (image 98, series 6). The right
profunda femoris artery originates above the level of the hip. No
significant abnormality of the visualized left outflow arteries.

Veins: No obvious venous abnormality within the limitations of this
arterial phase study.

Review of the MIP images confirms the above findings.

NON-VASCULAR

Hepatobiliary: Postsurgical changes of cholecystectomy are seen.
Hepatic contours appear nodular, suspicious for cirrhosis. No
significant biliary dilatation.

Pancreas: Unremarkable. No pancreatic ductal dilatation or
surrounding inflammatory changes.

Spleen: Normal in size without focal abnormality.

Adrenals/Urinary Tract: Adrenal glands are normal. 1.9 cm simple
cyst present in the lower pole of the right kidney. Kidneys,
ureters, and bladder otherwise unremarkable.

Stomach/Bowel: No bowel dilatation to indicate ileus or obstruction.
Appendix is normal. Extensive diverticulosis of the descending and
sigmoid colon.

Lymphatic: No enlarged abdominal or pelvic lymph nodes. Multiple
nonenlarged lymph nodes are seen in the retroperitoneum. Insert most
likely reactive.

Reproductive: Mildly enlarged prostate.

Other: No abdominal wall hernia or abnormality. No abdominopelvic
ascites.

Musculoskeletal: No acute or significant osseous findings.

Review of the MIP images confirms the above findings.
IMPRESSION: 1. No acute abnormality of the chest, abdomen, or pelvis. No aortic
dissection.
2. Non flow limiting, focal dissection of the proximal right
superficial femoral artery.
3. Nodular hepatic contours are suspicious for cirrhosis.
4. Distal colonic diverticulosis without evidence of acute
diverticulitis.

## 2022-06-20 DIAGNOSIS — J449 Chronic obstructive pulmonary disease, unspecified: Secondary | ICD-10-CM | POA: Diagnosis not present

## 2022-07-19 DIAGNOSIS — J449 Chronic obstructive pulmonary disease, unspecified: Secondary | ICD-10-CM | POA: Diagnosis not present

## 2022-08-19 DIAGNOSIS — J449 Chronic obstructive pulmonary disease, unspecified: Secondary | ICD-10-CM | POA: Diagnosis not present

## 2022-09-18 DIAGNOSIS — J449 Chronic obstructive pulmonary disease, unspecified: Secondary | ICD-10-CM | POA: Diagnosis not present

## 2022-10-19 DIAGNOSIS — J449 Chronic obstructive pulmonary disease, unspecified: Secondary | ICD-10-CM | POA: Diagnosis not present

## 2022-11-18 DIAGNOSIS — J449 Chronic obstructive pulmonary disease, unspecified: Secondary | ICD-10-CM | POA: Diagnosis not present

## 2022-12-19 DIAGNOSIS — J449 Chronic obstructive pulmonary disease, unspecified: Secondary | ICD-10-CM | POA: Diagnosis not present

## 2023-01-19 DIAGNOSIS — J449 Chronic obstructive pulmonary disease, unspecified: Secondary | ICD-10-CM | POA: Diagnosis not present

## 2023-12-03 NOTE — Progress Notes (Signed)
 This encounter was created in error - please disregard.
